# Patient Record
Sex: Female | Born: 1950
Health system: Southern US, Community
[De-identification: ages and names within clinical notes are randomized; demographics above are authoritative.]

## PROBLEM LIST (undated history)

## (undated) DIAGNOSIS — J189 Pneumonia, unspecified organism: Secondary | ICD-10-CM

## (undated) DIAGNOSIS — M1990 Unspecified osteoarthritis, unspecified site: Secondary | ICD-10-CM

## (undated) DIAGNOSIS — I10 Essential (primary) hypertension: Secondary | ICD-10-CM

## (undated) DIAGNOSIS — R51 Headache: Secondary | ICD-10-CM

## (undated) DIAGNOSIS — Z87442 Personal history of urinary calculi: Secondary | ICD-10-CM

## (undated) DIAGNOSIS — F32A Depression, unspecified: Secondary | ICD-10-CM

## (undated) DIAGNOSIS — F419 Anxiety disorder, unspecified: Secondary | ICD-10-CM

## (undated) HISTORY — DX: Headache: R51

## (undated) HISTORY — PX: TOTAL ABDOMINAL HYSTERECTOMY: SHX209

## (undated) HISTORY — DX: Essential (primary) hypertension: I10

## (undated) HISTORY — PX: CHOLECYSTECTOMY: SHX55

## (undated) HISTORY — PX: TONSILLECTOMY: SUR1361

---

## 1997-05-10 ENCOUNTER — Encounter: Admission: RE | Admit: 1997-05-10 | Discharge: 1997-05-10 | Payer: Self-pay | Admitting: Family Medicine

## 1997-05-17 ENCOUNTER — Encounter: Admission: RE | Admit: 1997-05-17 | Discharge: 1997-05-17 | Payer: Self-pay | Admitting: Family Medicine

## 1997-05-25 ENCOUNTER — Encounter: Admission: RE | Admit: 1997-05-25 | Discharge: 1997-05-25 | Payer: Self-pay | Admitting: Family Medicine

## 1998-03-17 ENCOUNTER — Encounter: Admission: RE | Admit: 1998-03-17 | Discharge: 1998-03-17 | Payer: Self-pay | Admitting: Family Medicine

## 1998-08-23 ENCOUNTER — Other Ambulatory Visit: Admission: RE | Admit: 1998-08-23 | Discharge: 1998-08-23 | Payer: Self-pay | Admitting: Obstetrics and Gynecology

## 1998-09-05 ENCOUNTER — Encounter: Admission: RE | Admit: 1998-09-05 | Discharge: 1998-09-05 | Payer: Self-pay | Admitting: Family Medicine

## 1999-08-30 ENCOUNTER — Other Ambulatory Visit: Admission: RE | Admit: 1999-08-30 | Discharge: 1999-08-30 | Payer: Self-pay | Admitting: Obstetrics and Gynecology

## 2000-04-18 ENCOUNTER — Encounter: Admission: RE | Admit: 2000-04-18 | Discharge: 2000-04-18 | Payer: Self-pay | Admitting: Obstetrics and Gynecology

## 2000-04-18 ENCOUNTER — Encounter: Payer: Self-pay | Admitting: Obstetrics and Gynecology

## 2000-09-17 ENCOUNTER — Other Ambulatory Visit: Admission: RE | Admit: 2000-09-17 | Discharge: 2000-09-17 | Payer: Self-pay | Admitting: Obstetrics and Gynecology

## 2001-04-21 ENCOUNTER — Encounter: Payer: Self-pay | Admitting: Obstetrics and Gynecology

## 2001-04-21 ENCOUNTER — Encounter: Admission: RE | Admit: 2001-04-21 | Discharge: 2001-04-21 | Payer: Self-pay | Admitting: Obstetrics and Gynecology

## 2002-04-28 ENCOUNTER — Encounter: Admission: RE | Admit: 2002-04-28 | Discharge: 2002-04-28 | Payer: Self-pay | Admitting: Obstetrics and Gynecology

## 2002-04-28 ENCOUNTER — Encounter: Payer: Self-pay | Admitting: Obstetrics and Gynecology

## 2002-08-19 ENCOUNTER — Ambulatory Visit (HOSPITAL_COMMUNITY): Admission: RE | Admit: 2002-08-19 | Discharge: 2002-08-19 | Payer: Self-pay | Admitting: Gastroenterology

## 2003-06-17 ENCOUNTER — Encounter: Admission: RE | Admit: 2003-06-17 | Discharge: 2003-06-17 | Payer: Self-pay | Admitting: Family Medicine

## 2010-03-26 ENCOUNTER — Institutional Professional Consult (permissible substitution): Payer: Self-pay | Admitting: Internal Medicine

## 2010-04-19 ENCOUNTER — Encounter (INDEPENDENT_AMBULATORY_CARE_PROVIDER_SITE_OTHER): Payer: Self-pay | Admitting: *Deleted

## 2010-04-19 ENCOUNTER — Institutional Professional Consult (permissible substitution) (INDEPENDENT_AMBULATORY_CARE_PROVIDER_SITE_OTHER): Payer: BC Managed Care – PPO | Admitting: Internal Medicine

## 2010-04-19 ENCOUNTER — Other Ambulatory Visit: Payer: BC Managed Care – PPO

## 2010-04-19 ENCOUNTER — Encounter: Payer: Self-pay | Admitting: Internal Medicine

## 2010-04-19 DIAGNOSIS — J45909 Unspecified asthma, uncomplicated: Secondary | ICD-10-CM

## 2010-04-19 DIAGNOSIS — J302 Other seasonal allergic rhinitis: Secondary | ICD-10-CM

## 2010-04-19 DIAGNOSIS — J309 Allergic rhinitis, unspecified: Secondary | ICD-10-CM

## 2010-04-19 DIAGNOSIS — J3089 Other allergic rhinitis: Secondary | ICD-10-CM

## 2010-04-19 HISTORY — DX: Other seasonal allergic rhinitis: J30.2

## 2010-04-19 HISTORY — DX: Unspecified asthma, uncomplicated: J45.909

## 2010-04-19 HISTORY — DX: Other allergic rhinitis: J30.89

## 2010-04-21 DIAGNOSIS — I10 Essential (primary) hypertension: Secondary | ICD-10-CM | POA: Insufficient documentation

## 2010-04-21 HISTORY — DX: Essential (primary) hypertension: I10

## 2010-04-23 LAB — CONVERTED CEMR LAB: IgE (Immunoglobulin E), Serum: 3.8 intl units/mL (ref 0.0–180.0)

## 2010-04-24 NOTE — Assessment & Plan Note (Signed)
Summary: per pt call/allergies/self referral/mhh   Primary Provider/Referring Provider:  Marinda Elk [facility]   CC:  Allergy/asthma consult.  History of Present Illness: April 19, 2010- 60yoF self referrred with concern about her breathing.Comes with her mother. Never smoker describes past history of frequent sinus infections and family but not personal hx of asthma. Over the lat year or two she is noticing frequent/ daily chst tightness, with some cough and wheeze. Triggers have included temperature changes, seasonal pollens which also cause nasal congestion, as well as grass mowing. leaf raking and dusting/ house keeping. Husband likes to cook with a wood stove and that smoke will take her breath. Dr Cristino Martes an office spirometry and told her she had asthma. She was started on Advair. Earlier she had noted improvement using her sister's Advair.  She has homes here and at [facility], noting symptomms more with the changes of air back and forth, rather than with either location specifically. Now taking daily loratadine for postnasal drip. 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 [place]  Risk Factors: Smoking Status: never (04/19/2010)  Past Medical History: Asthma Allergic Rhinitis Hypertension Headache  Past Surgical History: Cholecystectomy Total Abdominal Hysterectomy Tonsillectomy  Family History: Sister- asthma, seasonal rhinitis, obstructive sleep apnea Heart disease Asthma Allergic  rhinitis Cancers  Social History: Marreid- Chrissie Noa Dilon 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  The patient denies coughing up blood, chest pain, irregular heartbeats, loss of appetite, weight change, abdominal pain, tooth/dental problems, headaches, itching, ear ache, hand/feet swelling, rash, change in color of mucus, and fever.         Mother says she snores very little  Vital Signs:  Patient profile:   60 year old female Weight:      222.50 pounds O2 Sat:      96 % on Room air Pulse rate:   82 / minute BP sitting:   124 / 72  (left arm) Cuff size:   large  Vitals Entered By: Reynaldo Minium CMA (April 19, 2010 4:04 PM)  O2 Flow:  Room air CC: Allergy/asthma consult   Physical Exam  Additional Exam:  General: A/Ox3; pleasant and cooperative, NAD, overweight SKIN: no rash, lesions NODES: no lymphadenopathy HEENT: [place]/AT, EOM- WNL, Conjuctivae- clear, PERRLA, TM-WNL, Nose- clear, Throat- clear and wnl, Mallampati  III, not red NECK: Supple w/ fair ROM, JVD- none, normal carotid impulses w/o bruits Thyroid- normal to palpation CHEST: Clear to P&A. Minor dry cough noted once HEART: RRR, no m/g/r heard ABDOMEN: Soft and nl; nml bowel sounds; no organomegaly or masses noted ZOX:WRUE, nl pulses, no edema, cyanosis or clubbng NEURO: Grossly intact to observation      Impression & Recommendations:  Problem # 1:  ASTHMA (ICD-493.90) We will get PFT and maintain with Advair 100.  Add rescue inhaler withh education. Depending on results we can adjust meds. We will make a preliminary assessment of allergy  significance.   Problem # 2:  ALLERGIC RHINITIS (ICD-477.9) At least mild rhinitis with hx of rhinosinusitis associated more with colds. Now taking daily claritin.  Medications Added to Medication List This Visit: 1)  Benicar 20 Mg Tabs (Olmesartan medoxomil) .... Take 1 by mouth once daily 2)  Effexor Xr 150 Mg Xr24h-cap (Venlafaxine hcl) .... Take 1 by mouth once daily 3)  Advair Diskus 100-50 Mcg/dose Aepb (Fluticasone-salmeterol) .Marland Kitchen.. 1 puff and rinse mouth twice daily 4)  Proair Hfa 108 (90 Base) Mcg/act Aers (Albuterol sulfate) .... 2 puffs four times a day as needed rescue  Other Orders: New Patient Level IV (81191) T-Allergy Profile [place], [place], MD, [place], [place] [phone])  Patient Instructions: 1)  Please schedule a follow-up appointment in 2 months. 2)  Script to continue Advair 3)  Schedule PFT 4)  script for Proair rescue inhaler 5)  lab 6)  cc Dr Foy [place] Prescriptions: PROAIR HFA 108 (90 BASE) MCG/ACT AERS (ALBUTEROL SULFATE) 2 puffs four times a day as needed rescue  #1 x prn   Entered and Authorized by:   Waymon Budge MD   Signed by:   Waymon Budge MD on 04/19/2010   Method used:   Print then Give to Patient   RxID:   [id number] ADVAIR DISKUS 100-50 MCG/DOSE AEPB (FLUTICASONE-SALMETEROL) 1 puff and rinse mouth twice daily  #1 x prn   Entered and Authorized by:   Waymon Budge MD   Signed by:   Waymon Budge MD on 04/19/2010   Method used:   Print then Give to Patient   RxID:   [id number]

## 2010-04-30 ENCOUNTER — Ambulatory Visit (INDEPENDENT_AMBULATORY_CARE_PROVIDER_SITE_OTHER): Payer: BC Managed Care – PPO | Admitting: Internal Medicine

## 2010-04-30 DIAGNOSIS — J45909 Unspecified asthma, uncomplicated: Secondary | ICD-10-CM

## 2010-04-30 LAB — PULMONARY FUNCTION TEST

## 2010-04-30 NOTE — Progress Notes (Signed)
PFT done today. 

## 2010-05-07 ENCOUNTER — Encounter: Payer: Self-pay | Admitting: Internal Medicine

## 2010-06-21 ENCOUNTER — Ambulatory Visit: Payer: BC Managed Care – PPO | Admitting: Internal Medicine

## 2010-07-25 ENCOUNTER — Encounter: Payer: Self-pay | Admitting: Internal Medicine

## 2010-07-25 ENCOUNTER — Ambulatory Visit (INDEPENDENT_AMBULATORY_CARE_PROVIDER_SITE_OTHER): Payer: BC Managed Care – PPO | Admitting: Internal Medicine

## 2010-07-25 VITALS — BP 120/78 | HR 71 | Ht 62.0 in | Wt 227.6 lb

## 2010-07-25 DIAGNOSIS — J45909 Unspecified asthma, uncomplicated: Secondary | ICD-10-CM

## 2010-07-25 DIAGNOSIS — J309 Allergic rhinitis, unspecified: Secondary | ICD-10-CM

## 2010-07-25 MED ORDER — MONTELUKAST SODIUM 10 MG PO TABS: 10.0000 mg | ORAL_TABLET | Freq: Every day | ORAL | Status: DC

## 2010-07-25 MED ORDER — FLUTICASONE-SALMETEROL 250-50 MCG/DOSE IN AEPB: 1.0000 | INHALATION_SPRAY | Freq: Two times a day (BID) | RESPIRATORY_TRACT | Status: DC

## 2010-07-25 NOTE — Assessment & Plan Note (Signed)
[place] has [place]- unclear if that will help.  We will schedule allergy skin tests and PFT. Revise meds

## 2010-07-25 NOTE — Progress Notes (Signed)
  Subjective:    Patient ID: Teresa Murphy, female    DOB: [birth year], 60 y.o.   MRN: [id number]  HPI 07/25/10- Asthma, allergic rhintiis Last here April 19, 2010. Note reviewed. Since last here, starting about 3 weeks ago, began increased drainage, head congestion. Took mucinex and breathed steam, but it moved into her chest. Inhaler helps. Mostly this is a spring time issue, starting in March, sometimes in Fall, often while at [place]. Claritin not sufficient. Distinct intervals completely well between spells, and worse if outside more. Suspects grass or hay exposure. Easily winded walking. Never skin tested. Allergy profile 04/19/10- neg, IGe 3.8 She has new HVAC at home and new moisture barrier.   Review of Systems Constitutional:   No weight loss, night sweats,  Fevers, chills, fatigue, lassitude. HEENT:   No headaches,  Difficulty swallowing,  Tooth/dental problems,  Sore throat,                ,   CV:  No chest pain,  Orthopnea, PND, swelling in lower extremities, anasarca, dizziness, palpitations  GI  No heartburn, indigestion, abdominal pain, nausea, vomiting, diarrhea, change in bowel habits, loss of appetite  Resp: No shortness of breath with exertion or at rest.  No excess mucus, no productive cough,  No non-productive cough,  No coughing up of blood.  No change in color of mucus.  No wheezing.   Skin: no rash or lesions.  GU: no dysuria, change in color of urine, no urgency or frequency.  No flank pain.  MS:  No joint pain or swelling.  No decreased range of motion.  No back pain.  Psych:  No change in mood or affect. No depression or anxiety.  No memory loss.      Objective:   Physical Exam General- Alert, Oriented, Affect-appropriate, Distress- none acute  overweight  Skin- rash-none, lesions- none, excoriation- none  Lymphadenopathy- none  Head- atraumatic  Eyes- Gross vision intact, PERRLA, conjunctivae clear secretions  Ears- Hearing, canals, Tm-  normal  Nose- Clear, No- Septal dev, mucus, polyps, erosion, perforation   Throat- Mallampati II , mucosa clear , drainage- none, tonsils- atrophic  Neck- flexible , trachea midline, no stridor , thyroid nl, carotid no bruit  Chest - symmetrical excursion , unlabored     Heart/CV- RRR , no murmur , no gallop  , no rub, nl s1 s2                     - JVD- none , edema- none, stasis changes- none, varices- none     Lung-  Dry cough with deep breath, minimal wheeze , dullness-none, rub- none     Chest wall-   Abd- tender-no, distended-no, bowel sounds-present, HSM- no  Br/ Gen/ Rectal- Not done, not indicated  Extrem- cyanosis- none, clubbing, none, atrophy- none, strength- nl  Neuro- grossly intact to observation         Assessment & Plan:

## 2010-07-25 NOTE — Patient Instructions (Signed)
Sample and script-  Change Advair to 250/50   1 puff and rinse well, twice daily  Script Singulair 1 daily   Schedule PFT- dx asthma  Schedule allergy skin testing- Antihistamines block these, so it is important to be off all cold and allergy meds, cough meds and otc sleep meds  For 3 days before the testing.

## 2010-07-30 NOTE — Assessment & Plan Note (Signed)
She complains of a post nasal drip but with little itch or sneeze and not much repose to meds/ consider vasomotor process.

## 2010-09-18 ENCOUNTER — Ambulatory Visit (INDEPENDENT_AMBULATORY_CARE_PROVIDER_SITE_OTHER): Payer: BC Managed Care – PPO | Admitting: Internal Medicine

## 2010-09-18 DIAGNOSIS — J45909 Unspecified asthma, uncomplicated: Secondary | ICD-10-CM

## 2010-09-18 LAB — PULMONARY FUNCTION TEST

## 2010-09-18 NOTE — Progress Notes (Signed)
PFT done today. 

## 2010-09-24 ENCOUNTER — Ambulatory Visit (INDEPENDENT_AMBULATORY_CARE_PROVIDER_SITE_OTHER): Payer: BC Managed Care – PPO | Admitting: Internal Medicine

## 2010-09-24 ENCOUNTER — Encounter: Payer: Self-pay | Admitting: Internal Medicine

## 2010-09-24 VITALS — BP 122/78 | HR 84 | Ht 62.0 in | Wt 232.0 lb

## 2010-09-24 DIAGNOSIS — J309 Allergic rhinitis, unspecified: Secondary | ICD-10-CM

## 2010-09-24 DIAGNOSIS — J45909 Unspecified asthma, uncomplicated: Secondary | ICD-10-CM

## 2010-09-24 NOTE — Patient Instructions (Signed)
Consider antihistamine like claritin otc for the froggy throat  Consider Pepcid  otc acid blocker once daily before breakfast for the reflux for a while.  Remember not to take the antihistamines like claritin for 3 days before your allergy testing.

## 2010-09-24 NOTE — Progress Notes (Signed)
Subjective:    Patient ID: Teresa Murphy, female    DOB: 1950/11/21, 60 y.o.   MRN: [id number]  HPI    Review of Systems     Objective:   Physical Exam        Assessment & Plan:   Subjective:    Patient ID: Teresa Murphy, female    DOB: 1950/02/19, 60 y.o.   MRN: [id number]  HPI 07/25/10- Asthma, allergic rhintiis Last here April 19, 2010. Note reviewed. Since last here, starting about 3 weeks ago, began increased drainage, head congestion. Took mucinex and breathed steam, but it moved into her chest. Inhaler helps. Mostly this is a spring time issue, starting in March, sometimes in Fall, often while at [place]. Claritin not sufficient. Distinct intervals completely well between spells, and worse if outside more. Suspects grass or hay exposure. Easily winded walking. Never skin tested. Allergy profile 04/19/10- neg, IGe 3.8 She has new HVAC at home and new moisture barrier.   09/24/10-60 year old female nonsmoker followed for asthma, allergic rhinitis  Last here 07/25/2010-note reviewed [place] home. No major attacks or changes since last here. Continues Advair, Singulair. Rescue inhaler  Noticing some throat clearing in current wet weather. Denies sinus trouble, but admits heartburn last few nights. PFT-09/18/10- mild obstruction with response to BD. FEV1 1.95/ 92%; FEV1/FVC 0.76; FEF25-75 improved 48% w/ bronchodilator   Review of Systems Constitutional:   No weight loss, night sweats,  Fevers, chills, fatigue, lassitude. HEENT:   No headaches,  Difficulty swallowing,  Tooth/dental problems,  Sore throat,  CV:  No chest pain,  Orthopnea, PND, swelling in lower extremities, anasarca, dizziness, palpitations GI  No heartburn, indigestion, abdominal pain, nausea, vomiting, diarrhea, change in bowel habits, loss of appetite Resp: No shortness of breath with exertion or at rest.  No excess mucus, no productive cough,  No non-productive cough,  No coughing up  of blood.  No change in color of mucus.  No wheezing.  Skin: no rash or lesions. GU: no dysuria, change in color of urine, no urgency or frequency.  No flank pain. MS:  No joint pain or swelling.  No decreased range of motion.  No back pain. Psych:  No change in mood or affect. No depression or anxiety.  No memory loss.      Objective:   Physical Exam General- Alert, Oriented, Affect-appropriate, Distress- none acute Skin- rash-none, lesions- none, excoriation- none Lymphadenopathy- none Head- atraumatic            Eyes- Gross vision intact, PERRLA, conjunctivae clear secretions            Ears- Hearing, canals normal            Nose- Clear, no-Septal dev, mucus, polyps, erosion, perforation             Throat- Mallampati III , mucosa clear , drainage- none, tonsils- atrophic Neck- flexible , trachea midline, no stridor , thyroid nl, carotid no bruit Chest - symmetrical excursion , unlabored           Heart/CV- RRR , no murmur , no gallop  , no rub, nl s1 s2                           - JVD- none , edema- none, stasis changes- none, varices- none           Lung- clear to P&A, wheeze- none, cough- none , dullness-none, rub-  none           Chest wall-  Abd- tender-no, distended-no, bowel sounds-present, HSM- no Br/ Gen/ Rectal- Not done, not indicated Extrem- cyanosis- none, clubbing, none, atrophy- none, strength- nl Neuro- grossly intact to observation         Assessment & Plan:

## 2010-09-24 NOTE — Assessment & Plan Note (Signed)
Allergy skin testing pending in October. We discussed and reminded to avoid antihistamine then

## 2010-09-24 NOTE — Assessment & Plan Note (Signed)
Asthma is well controlled 

## 2010-11-07 ENCOUNTER — Ambulatory Visit (INDEPENDENT_AMBULATORY_CARE_PROVIDER_SITE_OTHER): Payer: BC Managed Care – PPO | Admitting: Internal Medicine

## 2010-11-07 ENCOUNTER — Encounter: Payer: Self-pay | Admitting: Internal Medicine

## 2010-11-07 VITALS — BP 140/82 | HR 93 | Ht 62.0 in | Wt 234.4 lb

## 2010-11-07 DIAGNOSIS — J45909 Unspecified asthma, uncomplicated: Secondary | ICD-10-CM

## 2010-11-07 DIAGNOSIS — J309 Allergic rhinitis, unspecified: Secondary | ICD-10-CM

## 2010-11-07 MED ORDER — MOMETASONE FUROATE 50 MCG/ACT NA SUSP: 2.0000 | Freq: Every day | NASAL | Status: DC

## 2010-11-07 NOTE — Patient Instructions (Signed)
Consider dust mite encasings and air cleaner  Sample and script for Nasonex-    1-2 puffs in each nostril once daily at bedtime during allergy seasons  Flu vax

## 2010-11-07 NOTE — Progress Notes (Signed)
Patient ID: Teresa Murphy, female    DOB: Nov 30, 1950, 60 y.o.   MRN: [id number]  HPI 07/25/10- Asthma, allergic rhintiis Last here April 19, 2010. Note reviewed. Since last here, starting about 3 weeks ago, began increased drainage, head congestion. Took mucinex and breathed steam, but it moved into her chest. Inhaler helps. Mostly this is a spring time issue, starting in March, sometimes in Fall, often while at [place]. Claritin not sufficient. Distinct intervals completely well between spells, and worse if outside more. Suspects grass or hay exposure. Easily winded walking. Never skin tested. Allergy profile 04/19/10- neg, IGe 3.8 She has new HVAC at home and new moisture barrier.   09/24/10-60 year old female nonsmoker followed for asthma, allergic rhinitis  Last here 07/25/2010-note reviewed [place] home. No major attacks or changes since last here. Continues Advair, Singulair. Rescue inhaler  Noticing some throat clearing in current wet weather. Denies sinus trouble, but admits heartburn last few nights. PFT-09/18/10- mild obstruction with response to BD. FEV1 1.95/ 92%; FEV1/FVC 0.76; FEF25-75 improved 48% w/ bronchodilator  11/07/10- 60 year old female nonsmoker followed for asthma, allergic rhinitis  She comes today for allergy skin testing. Offered antihistamines as planned, she notices nasal congestion and hoarseness, sneezing and postnasal drip. Has been minimal wheeze and some chest tightness. She does notice that the cooler weather is bothering her some. Skin test: Allergy skin tests show significant positives particularly for grass, tree and dust mite.  Review of Systems Constitutional:   No weight loss, night sweats,  Fevers, chills, fatigue, lassitude. HEENT:   No headaches,  Difficulty swallowing,  Tooth/dental problems,  Sore throat,  CV:  No chest pain,  Orthopnea, PND, swelling in lower extremities, anasarca, dizziness, palpitations GI  No heartburn,  indigestion, abdominal pain, nausea, vomiting, diarrhea, change in bowel habits, loss of appetite Resp: No shortness of breath with exertion or at rest.  No excess mucus, no productive cough,  No non-productive cough,  No coughing up of blood.  No change in color of mucus.  No wheezing.  Skin: no rash or lesions. GU: no dysuria, change in color of urine, no urgency or frequency.  No flank pain. MS:  No joint pain or swelling.  No decreased range of motion.  No back pain. Psych:  No change in mood or affect. No depression or anxiety.  No memory loss.      Objective:   Physical Exam General- Alert, Oriented, Affect-appropriate, Distress- none acute Skin- rash-none, lesions- none, excoriation- none Lymphadenopathy- none Head- atraumatic            Eyes- Gross vision intact, PERRLA, conjunctivae clear secretions            Ears- Hearing, canals normal            Nose- blowing her nose, mild turbinate edema, no-Septal dev, +mucus bridging, no-polyps, erosion, perforation             Throat- Mallampati III , mucosa clear , drainage- none, tonsils- atrophic, a little hoarse Neck- flexible , trachea midline, no stridor , thyroid nl, carotid no bruit Chest - symmetrical excursion , unlabored           Heart/CV- RRR , no murmur , no gallop  , no rub, nl s1 s2                           - JVD- none , edema- none, stasis changes- none, varices-  none           Lung- clear to P&A, wheeze- none, dry cough , dullness-none, rub- none           Chest wall-  Abd- tender-no, distended-no, bowel sounds-present, HSM- no Br/ Gen/ Rectal- Not done, not indicated Extrem- cyanosis- none, clubbing, none, atrophy- none, strength- nl Neuro- grossly intact to observation         Assessment & Plan:

## 2010-11-10 NOTE — Assessment & Plan Note (Signed)
Education done on environmental precautions particularly for house dust. She will resume antihistamines. Ttry adding Nasonex. Allergy vaccine would be an option for her if additional therapy were needed. We discussed the logistics and risk-benefit.

## 2010-11-13 ENCOUNTER — Encounter: Payer: Self-pay | Admitting: Internal Medicine

## 2011-03-14 ENCOUNTER — Encounter: Payer: Self-pay | Admitting: Internal Medicine

## 2011-03-14 ENCOUNTER — Ambulatory Visit (INDEPENDENT_AMBULATORY_CARE_PROVIDER_SITE_OTHER): Payer: BC Managed Care – PPO | Admitting: Internal Medicine

## 2011-03-14 VITALS — BP 122/70 | HR 97 | Ht 62.0 in | Wt 238.2 lb

## 2011-03-14 DIAGNOSIS — J309 Allergic rhinitis, unspecified: Secondary | ICD-10-CM

## 2011-03-14 DIAGNOSIS — J45909 Unspecified asthma, uncomplicated: Secondary | ICD-10-CM

## 2011-03-14 MED ORDER — LEVALBUTEROL HCL 0.63 MG/3ML IN NEBU
0.6300 mg | INHALATION_SOLUTION | Freq: Once | RESPIRATORY_TRACT | Status: AC
  Administered 2011-03-14: 0.63 mg via RESPIRATORY_TRACT

## 2011-03-14 MED ORDER — BENZONATATE 200 MG PO CAPS: 200.0000 mg | ORAL_CAPSULE | Freq: Three times a day (TID) | ORAL | Status: AC | PRN

## 2011-03-14 MED ORDER — METHYLPREDNISOLONE ACETATE 80 MG/ML IJ SUSP
80.0000 mg | Freq: Once | INTRAMUSCULAR | Status: AC
  Administered 2011-03-14: 80 mg via INTRAMUSCULAR

## 2011-03-14 NOTE — Progress Notes (Signed)
Patient ID: Teresa Murphy, female    DOB: 11/25/1950, 61 y.o.   MRN: [id number]  HPI 07/25/10- Asthma, allergic rhintiis Last here April 19, 2010. Note reviewed. Since last here, starting about 3 weeks ago, began increased drainage, head congestion. Took mucinex and breathed steam, but it moved into her chest. Inhaler helps. Mostly this is a spring time issue, starting in March, sometimes in Fall, often while at [place]. Claritin not sufficient. Distinct intervals completely well between spells, and worse if outside more. Suspects grass or hay exposure. Easily winded walking. Never skin tested. Allergy profile 04/19/10- neg, IGe 3.8 She has new HVAC at home and new moisture barrier.   09/24/10-61 year old female nonsmoker followed for asthma, allergic rhinitis  Last here 07/25/2010-note reviewed [place] home. No major attacks or changes since last here. Continues Advair, Singulair. Rescue inhaler  Noticing some throat clearing in current wet weather. Denies sinus trouble, but admits heartburn last few nights. PFT-09/18/10- mild obstruction with response to BD. FEV1 1.95/ 92%; FEV1/FVC 0.76; FEF25-75 improved 48% w/ bronchodilator  11/07/10- 61 year old female nonsmoker followed for asthma, allergic rhinitis  She comes today for allergy skin testing. Offered antihistamines as planned, she notices nasal congestion and hoarseness, sneezing and postnasal drip. Has been minimal wheeze and some chest tightness. She does notice that the cooler weather is bothering her some. Skin test: Allergy skin tests show significant positives particularly for grass, tree and dust mite.  3./17/66- 61 year old female nonsmoker followed for asthma, allergic rhinitis  Had flu shot. Raspy voice, postnasal drip. Otherwise well through the winter. Using both Nasonex and Singulair with occasional use of rescue inhaler.   Review of Systems Constitutional:   No weight loss, night sweats,  Fevers,  chills, fatigue, lassitude. HEENT:   No- headaches,  Difficulty swallowing,  Tooth/dental problems,  Sore throat,  CV:  No chest pain,  Orthopnea, PND, swelling in lower extremities, anasarca, dizziness, palpitations GI  No heartburn, indigestion, abdominal pain, nausea, vomiting, diarrhea, change in bowel habits, loss of appetite Resp: No shortness of breath with exertion or at rest.  No excess mucus, no productive cough,  No non-productive cough,  No coughing up of blood.  No change in color of mucus.  Some wheezing.  Skin: no rash or lesions. GU:  MS:  No joint pain or swelling.  No decreased range of motion.  No back pain. Psych:  No change in mood or affect. No depression or anxiety.  No memory loss.      Objective:   Physical Exam General- Alert, Oriented, Affect-appropriate, Distress- none acute Skin- rash-none, lesions- none, excoriation- none Lymphadenopathy- none Head- atraumatic            Eyes- Gross vision intact, PERRLA, conjunctivae clear secretions            Ears- Hearing, canals normal            Nose- blowing her nose, mild turbinate edema, no-Septal dev, +mucus bridging, no-polyps, erosion, perforation             Throat- Mallampati III , mucosa clear , drainage- none, tonsils- atrophic, a little hoarse Neck- flexible , trachea midline, no stridor , thyroid nl, carotid no bruit Chest - symmetrical excursion , unlabored           Heart/CV- RRR , no murmur , no gallop  , no rub, nl s1 s2                           -  JVD- none , edema- none, stasis changes- none, varices- none           Lung- clear to P&A, wheeze- none, raspy upper airway cough , dullness-none, rub- none           Chest wall-  Abd-  Br/ Gen/ Rectal- Not done, not indicated Extrem- cyanosis- none, clubbing, none, atrophy- none, strength- nl Neuro- grossly intact to observation

## 2011-03-14 NOTE — Patient Instructions (Signed)
Script sent benzonatate for cough  Neb xop 0.63  Depo 80

## 2011-03-17 NOTE — Assessment & Plan Note (Signed)
The current illness is post viral but we discussed upcoming spring pollen season.

## 2011-03-17 NOTE — Assessment & Plan Note (Signed)
Acute and chronic tracheobronchitis. Plan-nebulized Xopenex, Depo-Medrol, Tessalon.

## 2011-06-19 ENCOUNTER — Ambulatory Visit: Payer: BC Managed Care – PPO | Admitting: Internal Medicine

## 2011-06-25 ENCOUNTER — Other Ambulatory Visit: Payer: Self-pay | Admitting: Internal Medicine

## 2011-08-21 ENCOUNTER — Other Ambulatory Visit: Payer: Self-pay | Admitting: Internal Medicine

## 2012-01-08 ENCOUNTER — Telehealth: Payer: Self-pay | Admitting: Internal Medicine

## 2012-01-08 MED ORDER — ALBUTEROL SULFATE HFA 108 (90 BASE) MCG/ACT IN AERS: 2.0000 | INHALATION_SPRAY | RESPIRATORY_TRACT | Status: DC | PRN

## 2012-01-08 NOTE — Telephone Encounter (Signed)
I do not see where we gave sample of proair Please advise if okay to call in rx for this Pt last seen 03/14/11 No ov pending

## 2012-01-08 NOTE — Telephone Encounter (Signed)
Per CY-okay to Rx Proair HFA #1 2 puffs every 4 hours prn with prn refills.

## 2012-01-08 NOTE — Telephone Encounter (Signed)
RX sent and pt is aware. 

## 2012-04-02 ENCOUNTER — Other Ambulatory Visit: Payer: Self-pay | Admitting: Family Medicine

## 2012-04-02 DIAGNOSIS — Z1231 Encounter for screening mammogram for malignant neoplasm of breast: Secondary | ICD-10-CM

## 2012-04-29 ENCOUNTER — Ambulatory Visit
Admission: RE | Admit: 2012-04-29 | Discharge: 2012-04-29 | Disposition: A | Discharge status: Home / Self Care | Payer: BC Managed Care – PPO | Source: Ambulatory Visit | Attending: Family Medicine | Admitting: Family Medicine

## 2012-04-29 ENCOUNTER — Other Ambulatory Visit: Payer: Self-pay | Admitting: Family Medicine

## 2012-04-29 DIAGNOSIS — Z1231 Encounter for screening mammogram for malignant neoplasm of breast: Secondary | ICD-10-CM

## 2012-06-06 ENCOUNTER — Emergency Department (HOSPITAL_BASED_OUTPATIENT_CLINIC_OR_DEPARTMENT_OTHER): Payer: BC Managed Care – PPO

## 2012-06-06 ENCOUNTER — Encounter (HOSPITAL_BASED_OUTPATIENT_CLINIC_OR_DEPARTMENT_OTHER): Payer: Self-pay | Admitting: *Deleted

## 2012-06-06 ENCOUNTER — Emergency Department (HOSPITAL_BASED_OUTPATIENT_CLINIC_OR_DEPARTMENT_OTHER)
Admission: EM | Admit: 2012-06-06 | Discharge: 2012-06-06 | Disposition: A | Discharge status: Home / Self Care | Payer: BC Managed Care – PPO | Attending: Emergency Medicine | Admitting: Emergency Medicine

## 2012-06-06 DIAGNOSIS — X58XXXA Exposure to other specified factors, initial encounter: Secondary | ICD-10-CM | POA: Insufficient documentation

## 2012-06-06 DIAGNOSIS — R112 Nausea with vomiting, unspecified: Secondary | ICD-10-CM | POA: Insufficient documentation

## 2012-06-06 DIAGNOSIS — Z79899 Other long term (current) drug therapy: Secondary | ICD-10-CM | POA: Insufficient documentation

## 2012-06-06 DIAGNOSIS — Y939 Activity, unspecified: Secondary | ICD-10-CM | POA: Insufficient documentation

## 2012-06-06 DIAGNOSIS — S7012XA Contusion of left thigh, initial encounter: Secondary | ICD-10-CM

## 2012-06-06 DIAGNOSIS — S7010XA Contusion of unspecified thigh, initial encounter: Secondary | ICD-10-CM | POA: Insufficient documentation

## 2012-06-06 DIAGNOSIS — J45909 Unspecified asthma, uncomplicated: Secondary | ICD-10-CM | POA: Insufficient documentation

## 2012-06-06 DIAGNOSIS — Y929 Unspecified place or not applicable: Secondary | ICD-10-CM | POA: Insufficient documentation

## 2012-06-06 DIAGNOSIS — I10 Essential (primary) hypertension: Secondary | ICD-10-CM | POA: Insufficient documentation

## 2012-06-06 LAB — BASIC METABOLIC PANEL
CO2: 25 mEq/L (ref 19–32)
Calcium: 8.6 mg/dL (ref 8.4–10.5)
Creatinine, Ser: 0.7 mg/dL (ref 0.50–1.10)
Glucose, Bld: 92 mg/dL (ref 70–99)

## 2012-06-06 LAB — CBC WITH DIFFERENTIAL/PLATELET
Basophils Absolute: 0 10*3/uL (ref 0.0–0.1)
Eosinophils Relative: 4 % (ref 0–5)
HCT: 32.6 % — ABNORMAL LOW (ref 36.0–46.0)
Lymphocytes Relative: 31 % (ref 12–46)
Lymphs Abs: 2.6 10*3/uL (ref 0.7–4.0)
MCV: 92.6 fL (ref 78.0–100.0)
Monocytes Absolute: 0.7 10*3/uL (ref 0.1–1.0)
RDW: 13.7 % (ref 11.5–15.5)
WBC: 8.4 10*3/uL (ref 4.0–10.5)

## 2012-06-06 MED ORDER — FENTANYL CITRATE 0.05 MG/ML IJ SOLN
50.0000 ug | Freq: Once | INTRAMUSCULAR | Status: AC
  Administered 2012-06-06: 50 ug via INTRAVENOUS
  Filled 2012-06-06: qty 2

## 2012-06-06 MED ORDER — ONDANSETRON HCL 4 MG/2ML IJ SOLN
4.0000 mg | Freq: Once | INTRAMUSCULAR | Status: AC
  Administered 2012-06-06: 4 mg via INTRAVENOUS
  Filled 2012-06-06: qty 2

## 2012-06-06 MED ORDER — SODIUM CHLORIDE 0.9 % IV BOLUS (SEPSIS)
500.0000 mL | Freq: Once | INTRAVENOUS | Status: AC
  Administered 2012-06-06: 500 mL via INTRAVENOUS

## 2012-06-06 MED ORDER — OXYCODONE-ACETAMINOPHEN 5-325 MG PO TABS: 2.0000 | ORAL_TABLET | ORAL | Status: DC | PRN

## 2012-06-06 NOTE — ED Provider Notes (Signed)
History  This chart was scribed for Rolan Bucco, MD, by Candelaria Stagers, ED Scribe. This patient was seen in room MH07/MH07 and the patient's care was started at 6:18 PM   CSN: [id number]  Arrival date & time 06/06/12  1744   First MD Initiated Contact with Patient 06/06/12 1812      Chief Complaint  Patient presents with  . Fall     The history is provided by the patient. No language interpreter was used.   JERINE SURLES is a 62 y.o. female who presents to the Emergency Department complaining of left mid thigh pain that started about one week ago after she backed into a floor vent and her left leg went into the vent, twisting her leg.  She is also experiencing left groin pain.  Pt was out of town and seen by an MD at [facility] who wanted further studies including [place].  Pt was seen by her PCP upon returning who did not recommend further studies.   She reports since then the pain and bruising has increased and is now making it difficult to walk.  She is able to bear weight with pain.  Pt has also been experiencing emesis with her last episode yesterday.  She denies numbness or tingling to the leg.  Pt denies hitting her head or LOC.      Past Medical History  Diagnosis Date  . Asthma   . Allergic rhinitis   . Hypertension   . Headache     Past Surgical History  Procedure Laterality Date  . Cholecystectomy    . Total abdominal hysterectomy    . Tonsillectomy      Family History  Problem Relation Age of Onset  . Asthma Sister   . Sleep apnea Sister   . Heart disease    . Allergic rhinitis    . Cancer      History  Substance Use Topics  . Smoking status: Never Smoker   . Smokeless tobacco: Not on file  . Alcohol Use: No    OB History   Grav Para Term Preterm Abortions TAB SAB Ect Mult Living                  Review of Systems  Constitutional: Negative for fever, chills, diaphoresis and fatigue.  HENT: Negative for congestion, rhinorrhea and sneezing.    Eyes: Negative.   Respiratory: Negative for cough, chest tightness and shortness of breath.   Cardiovascular: Negative for chest pain and leg swelling.  Gastrointestinal: Positive for nausea and vomiting. Negative for abdominal pain, diarrhea and blood in stool.  Genitourinary: Negative for frequency, hematuria, flank pain and difficulty urinating.  Musculoskeletal: Positive for myalgias and joint swelling. Negative for back pain and arthralgias.  Skin: Positive for wound. Negative for rash.  Neurological: Negative for dizziness, speech difficulty, weakness, numbness and headaches.    Allergies  Review of patient's allergies indicates no known allergies.  Home Medications   Current Outpatient Rx  Name  Route  Sig  Dispense  Refill  . ADVAIR DISKUS 250-50 MCG/DOSE AEPB      INHALE 1 PUFF INTO LUNGS TWICE A DAY RINSE AFTER USE   3 each   3   . albuterol (PROAIR HFA) 108 (90 BASE) MCG/ACT inhaler   Inhalation   Inhale 2 puffs into the lungs every 4 (four) hours as needed.   1 Inhaler   11   . Biotin 1000 MCG tablet   Oral  Take 1,000 mcg by mouth daily.           Marland Kitchen CALCIUM-VITAMIN D PO   Oral   Take 1 capsule by mouth 2 (two) times daily.           . Chromium Picolinate 400 MCG TABS   Oral   Take 1 tablet by mouth daily.           . diazepam (VALIUM) 5 MG tablet      1-3 times as needed         . Glucosamine-Chondroit-Vit C-Mn (GLUCOSAMINE CHONDR 1500 COMPLX PO)   Oral   Take 1 capsule by mouth daily.           Marland Kitchen loratadine (CLARITIN) 10 MG tablet   Oral   Take 10 mg by mouth daily.           Marland Kitchen EXPIRED: mometasone (NASONEX) 50 MCG/ACT nasal spray   Nasal   Place 2 sprays into the nose daily. At bedtime   17 g   prn   . montelukast (SINGULAIR) 10 MG tablet      TAKE 1 TABLET EVERY DAY   90 tablet   3   . olmesartan (BENICAR) 20 MG tablet   Oral   Take 20 mg by mouth daily.           . Omega-3 Fatty Acids (FISH OIL) 1200 MG CAPS    Oral   Take 1 capsule by mouth daily.           Marland Kitchen oxyCODONE-acetaminophen (PERCOCET) 5-325 MG per tablet   Oral   Take 2 tablets by mouth every 4 (four) hours as needed for pain.   20 tablet   0   . venlafaxine (EFFEXOR-XR) 150 MG 24 hr capsule   Oral   Take 150 mg by mouth daily.           . vitamin B-12 (CYANOCOBALAMIN) 1000 MCG tablet   Oral   Take 1,000 mcg by mouth daily.             BP 134/79  Pulse 70  Temp(Src) 97.9 F (36.6 C) (Oral)  Resp 20  Ht 5\' 3"  (1.6 m)  Wt 225 lb (102.059 kg)  BMI 39.87 kg/m2  SpO2 99%  Physical Exam  Constitutional: She is oriented to person, place, and time. She appears well-developed and well-nourished.  HENT:  Head: Normocephalic and atraumatic.  Eyes: Pupils are equal, round, and reactive to light.  Neck: Normal range of motion. Neck supple.  Cardiovascular: Normal rate, regular rhythm and normal heart sounds.   Pulmonary/Chest: Effort normal and breath sounds normal. No respiratory distress. She has no wheezes. She has no rales. She exhibits no tenderness.  Abdominal: Soft. Bowel sounds are normal. There is no tenderness. There is no rebound and no guarding.  Musculoskeletal: Normal range of motion. She exhibits no edema.  Patient has diffuse tenderness throughout the left thigh, primarily to the lateral aspect of the thigh from hip to the knee. She has a large amount of ecchymosis extending diffusely throughout the lateral thigh and into the posterior aspect of the thigh. It extends from the hip to the knee. She has a large approximately 10 cm hematoma to the lateral mid thigh. She has pain on range of motion of the left hip and tenderness on palpation of the entire ecchymotic area of the thighs. She also has diffuse tenderness to the left knee. She has normal sensation in the foot. She  has normal motor function in the foot. She has normal pulses in the foot. She has no significant pain below the knee.  Lymphadenopathy:    She  has no cervical adenopathy.  Neurological: She is alert and oriented to person, place, and time.  Skin: Skin is warm and dry. No rash noted.  Psychiatric: She has a normal mood and affect.    ED Course  Procedures   DIAGNOSTIC STUDIES: Oxygen Saturation is 99% on room air, normal by my interpretation.    COORDINATION OF CARE:  6:23 PM Discussed course of care with pt xrays of left leg and blood work.  Pt understands and agrees.    Labs Reviewed  CBC WITH DIFFERENTIAL - Abnormal; Notable for the following:    RBC 3.52 (*)    Hemoglobin 11.2 (*)    HCT 32.6 (*)    All other components within normal limits  BASIC METABOLIC PANEL - Abnormal; Notable for the following:    Potassium 3.1 (*)    GFR calc non Af Amer 90 (*)    All other components within normal limits   Dg Hip Complete Left  06/06/2012  *RADIOLOGY REPORT*  Clinical Data: Fall 1 week ago  LEFT HIP - COMPLETE 2+ VIEW  Comparison: Concurrently obtained radiographs of the knee and femur  Findings: The bony pelvis is intact.  The proximal left femur is also unremarkable.  No acute fracture or malalignment identified. The hip appears located.  Normal bony mineralization.  Unremarkable visualized bowel gas pattern.  No aggressive lytic or blastic osseous lesion.  Mild degenerative changes at the symphysis pubis.  IMPRESSION: Negative radiographs of the pelvis and left hip   Original Report Authenticated By: Malachy Moan, M.D.    Dg Femur Left  06/06/2012  *RADIOLOGY REPORT*  Clinical Data: Fall 1 week ago, left leg pain  LEFT FEMUR - 2 VIEW  Comparison: Concurrently obtained radiographs of the left hip and knee  Findings: The femur is intact.  No evidence of acute fracture or malalignment.  No focal soft tissue abnormality.  Normal bony mineralization.  Incompletely imaged tricompartmental osteoarthritis of the knee.  IMPRESSION: No acute osseous abnormality.   Original Report Authenticated By: Malachy Moan, M.D.    Ct Hip  Left Wo Contrast  06/06/2012  *RADIOLOGY REPORT*  Clinical Data: Left hip and inguinal pain status post fall  CT OF THE LEFT HIP WITHOUT CONTRAST  Technique:  Multidetector CT imaging was performed according to the standard protocol. Multiplanar CT image reconstructions were also generated.  Comparison: 06/06/2012 radiograph  Findings: The femoral head remains seated within the acetabulum. No displaced fracture.  No aggressive osseous lesions or overt degenerative change within the hip.  There is pubic symphysis and left SI joint DJD, partially imaged.  There is a partially imaged hematoma lateral to the musculature of the left thigh.  The musculature itself appears preserved.  No femoral shaft fracture. No inguinal hematoma with normal caliber vasculature.  IMPRESSION: No displaced fracture or dislocation of the left hip.  MRI has increased sensitivity for nondisplaced fracture or osseous contusion if clinical concern persists.  Left lateral thigh hematoma, partially imaged.   Original Report Authenticated By: Jearld Lesch, M.D.    Dg Knee Complete 4 Views Left  06/06/2012  *RADIOLOGY REPORT*  Clinical Data: Fall 1 week ago, increasing left leg pain  LEFT KNEE - COMPLETE 4+ VIEW  Comparison: Concurrently obtained radiographs the left hip and femur  Findings: Tricompartmental osteoarthritis.  Mild chondrocalcinosis  of the menisci bilaterally.  No knee joint effusion.  Incidental note is made of a fabella.  No focal soft tissue abnormality.  IMPRESSION: No acute fracture, malalignment or joint effusion.  Moderate tricompartmental osteoarthritis.   Original Report Authenticated By: Malachy Moan, M.D.      1. Thigh hematoma, left, initial encounter       MDM  Patient with a large thigh hematoma. She does not have evidence of compartment syndrome. She had x-rays that were negative for fracture however given her large amount of ecchymosis and tenderness I did do a CT scan through the hip to r/o occult  fracture. I discussed the case with Dr. Shelle Iron with orthopedics he recommends crutches nonweightbearing ice and elevation. Patient has an orthopedist, Dr. Charlann Boxer who she will followup with next week. She was given a prescription for Percocet for pain.  I personally performed the services described in this documentation, which was scribed in my presence.  The recorded information has been reviewed and considered.        Rolan Bucco, MD 06/06/12 2135

## 2012-06-06 NOTE — ED Notes (Signed)
Pt states she stepped back into a vent last Friday. Saw Dr. at [facility] who wanted studies done, but pt unable to stay. Saw PCP Wed.  and told tests not necessary. Bruising has increased as well as pain. More difficulty walking.

## 2012-06-17 ENCOUNTER — Emergency Department (HOSPITAL_BASED_OUTPATIENT_CLINIC_OR_DEPARTMENT_OTHER): Payer: BC Managed Care – PPO

## 2012-06-17 ENCOUNTER — Other Ambulatory Visit: Payer: Self-pay | Admitting: Internal Medicine

## 2012-06-17 ENCOUNTER — Emergency Department (HOSPITAL_BASED_OUTPATIENT_CLINIC_OR_DEPARTMENT_OTHER)
Admission: EM | Admit: 2012-06-17 | Discharge: 2012-06-17 | Disposition: A | Discharge status: Home / Self Care | Payer: BC Managed Care – PPO | Attending: Emergency Medicine | Admitting: Emergency Medicine

## 2012-06-17 ENCOUNTER — Encounter (HOSPITAL_BASED_OUTPATIENT_CLINIC_OR_DEPARTMENT_OTHER): Payer: Self-pay | Admitting: *Deleted

## 2012-06-17 DIAGNOSIS — S8010XA Contusion of unspecified lower leg, initial encounter: Secondary | ICD-10-CM | POA: Insufficient documentation

## 2012-06-17 DIAGNOSIS — Y9289 Other specified places as the place of occurrence of the external cause: Secondary | ICD-10-CM | POA: Insufficient documentation

## 2012-06-17 DIAGNOSIS — I10 Essential (primary) hypertension: Secondary | ICD-10-CM | POA: Insufficient documentation

## 2012-06-17 DIAGNOSIS — Z79899 Other long term (current) drug therapy: Secondary | ICD-10-CM | POA: Insufficient documentation

## 2012-06-17 DIAGNOSIS — X500XXA Overexertion from strenuous movement or load, initial encounter: Secondary | ICD-10-CM | POA: Insufficient documentation

## 2012-06-17 DIAGNOSIS — S8012XS Contusion of left lower leg, sequela: Secondary | ICD-10-CM

## 2012-06-17 DIAGNOSIS — J45909 Unspecified asthma, uncomplicated: Secondary | ICD-10-CM | POA: Insufficient documentation

## 2012-06-17 DIAGNOSIS — Y939 Activity, unspecified: Secondary | ICD-10-CM | POA: Insufficient documentation

## 2012-06-17 DIAGNOSIS — Z8709 Personal history of other diseases of the respiratory system: Secondary | ICD-10-CM | POA: Insufficient documentation

## 2012-06-17 MED ORDER — OXYCODONE-ACETAMINOPHEN 5-325 MG PO TABS: 2.0000 | ORAL_TABLET | ORAL | Status: DC | PRN

## 2012-06-17 NOTE — ED Notes (Signed)
MD at bedside giving test results and dispo plan.  

## 2012-06-17 NOTE — ED Notes (Signed)
Pt reports leg injury 5/3, seen here, returns to day for increased pain and swelling

## 2012-06-17 NOTE — ED Notes (Signed)
Left leg injury on 05/29/12 after stepping backwards into an open floor vent.  Has been seen here 2-3 times since injury for left leg.  C/o continued pain behind left knee and left leg bruising.  Denies calf pain.  She still limps and uses crutches to ambulate when needed.  Has taken Aleve, IBU and oxycodone for pain without relief in the last 2-3 nights.  Has also tried ice packs and heating pads without relief.

## 2012-06-17 NOTE — ED Provider Notes (Signed)
History     CSN: [id number]  Arrival date & time 06/17/12  1457   First MD Initiated Contact with Patient 06/17/12 1526      Chief Complaint  Patient presents with  . Leg Injury    (Consider location/radiation/quality/duration/timing/severity/associated sxs/prior treatment) HPI Comments: Patient presents with left flank pain. She had injury to her leg in early May. [place] a then a hole and twisted her left leg. She was seen here on May 3 by knee with a large amount of ecchymosis and hematoma around the quadriceps area. She had no evidence of fracture. She was advised in nonweightbearing for a few days with crutches, ice and elevation. She subsequently followed up with Dr. Charlann Boxer who is her orthopedist. He is advised her that he felt it was a muscular tear and advised her to continue her current treatment. She states her leg is feeling better and she is able to move it better. She states it's still markedly tender in the lateral aspect where she had the most swelling. She's concerned now because she's had some swelling behind her knee and into her calf. She's also noted ecchymosis has spread down into her lower leg. She's concerned that she may have a blood clot. She denies any chest pain or shortness of breath. She denies a history of DVTs in the past.   Past Medical History  Diagnosis Date  . Asthma   . Allergic rhinitis   . Hypertension   . Headache     Past Surgical History  Procedure Laterality Date  . Cholecystectomy    . Total abdominal hysterectomy    . Tonsillectomy      Family History  Problem Relation Age of Onset  . Asthma Sister   . Sleep apnea Sister   . Heart disease    . Allergic rhinitis    . Cancer      History  Substance Use Topics  . Smoking status: Never Smoker   . Smokeless tobacco: Not on file  . Alcohol Use: No    OB History   Grav Para Term Preterm Abortions TAB SAB Ect Mult Living                  Review of Systems   Constitutional: Negative for fever, chills, diaphoresis and fatigue.  HENT: Negative for congestion, rhinorrhea and sneezing.   Eyes: Negative.   Respiratory: Negative for cough, chest tightness and shortness of breath.   Cardiovascular: Negative for chest pain and leg swelling.  Gastrointestinal: Negative for nausea, vomiting, abdominal pain, diarrhea and blood in stool.  Genitourinary: Negative for frequency, hematuria, flank pain and difficulty urinating.  Musculoskeletal: Positive for joint swelling and arthralgias. Negative for back pain.  Skin: Negative for rash.  Neurological: Negative for dizziness, speech difficulty, weakness, numbness and headaches.    Allergies  Review of patient's allergies indicates no known allergies.  Home Medications   Current Outpatient Rx  Name  Route  Sig  Dispense  Refill  . ADVAIR DISKUS 250-50 MCG/DOSE AEPB      INHALE 1 PUFF INTO LUNGS TWICE A DAY RINSE AFTER USE   3 each   3   . albuterol (PROAIR HFA) 108 (90 BASE) MCG/ACT inhaler   Inhalation   Inhale 2 puffs into the lungs every 4 (four) hours as needed.   1 Inhaler   11   . Biotin 1000 MCG tablet   Oral   Take 1,000 mcg by  mouth daily.           Marland Kitchen CALCIUM-VITAMIN D PO   Oral   Take 1 capsule by mouth 2 (two) times daily.           . Chromium Picolinate 400 MCG TABS   Oral   Take 1 tablet by mouth daily.           . diazepam (VALIUM) 5 MG tablet      1-3 times as needed         . Glucosamine-Chondroit-Vit C-Mn (GLUCOSAMINE CHONDR 1500 COMPLX PO)   Oral   Take 1 capsule by mouth daily.           Marland Kitchen loratadine (CLARITIN) 10 MG tablet   Oral   Take 10 mg by mouth daily.           Marland Kitchen EXPIRED: mometasone (NASONEX) 50 MCG/ACT nasal spray   Nasal   Place 2 sprays into the nose daily. At bedtime   17 g   prn   . montelukast (SINGULAIR) 10 MG tablet      TAKE 1 TABLET EVERY DAY   90 tablet   3   . olmesartan (BENICAR) 20 MG tablet   Oral   Take 20 mg by  mouth daily.           . Omega-3 Fatty Acids (FISH OIL) 1200 MG CAPS   Oral   Take 1 capsule by mouth daily.           Marland Kitchen oxyCODONE-acetaminophen (PERCOCET) 5-325 MG per tablet   Oral   Take 2 tablets by mouth every 4 (four) hours as needed for pain.   20 tablet   0   . oxyCODONE-acetaminophen (PERCOCET) 5-325 MG per tablet   Oral   Take 2 tablets by mouth every 4 (four) hours as needed for pain.   20 tablet   0   . venlafaxine (EFFEXOR-XR) 150 MG 24 hr capsule   Oral   Take 150 mg by mouth daily.           . vitamin B-12 (CYANOCOBALAMIN) 1000 MCG tablet   Oral   Take 1,000 mcg by mouth daily.             BP 130/71  Pulse 87  Temp(Src) 98.5 F (36.9 C) (Oral)  Resp 18  Ht 5\' 3"  (1.6 m)  Wt 223 lb (101.152 kg)  BMI 39.51 kg/m2  SpO2 95%  Physical Exam  Constitutional: She is oriented to person, place, and time. She appears well-developed and well-nourished.  HENT:  Head: Normocephalic and atraumatic.  Eyes: Pupils are equal, round, and reactive to light.  Neck: Normal range of motion. Neck supple.  Cardiovascular: Normal rate, regular rhythm and normal heart sounds.   Pulmonary/Chest: Effort normal and breath sounds normal. No respiratory distress. She has no wheezes. She has no rales. She exhibits no tenderness.  Abdominal: Soft. Bowel sounds are normal. There is no tenderness. There is no rebound and no guarding.  Musculoskeletal: Normal range of motion. She exhibits no edema.  Patient still has a large hematoma to the left lateral thigh. Ecchymosis is markedly improved however she does have some new ecchymosis in her lower leg. Her pulses are intact. She has normal sensation motor function in her foot. She definitely has better movement in the leg as compared to her last visit.  Lymphadenopathy:    She has no cervical adenopathy.  Neurological: She is alert and oriented to person, place,  and time.  Skin: Skin is warm and dry. No rash noted.  Psychiatric:  She has a normal mood and affect.    ED Course  Procedures (including critical care time)  Results for orders placed during the hospital encounter of 06/06/12  CBC WITH DIFFERENTIAL      Result Value Range   WBC 8.4  4.0 - 10.5 K/uL   RBC 3.52 (*) 3.87 - 5.11 MIL/uL   Hemoglobin 11.2 (*) 12.0 - 15.0 g/dL   HCT 16.1 (*) 09.6 - 04.5 %   MCV 92.6  78.0 - 100.0 fL   MCH 31.8  26.0 - 34.0 pg   MCHC 34.4  30.0 - 36.0 g/dL   RDW 40.9  81.1 - 91.4 %   Platelets 262  150 - 400 K/uL   Neutrophils Relative % 57  43 - 77 %   Neutro Abs 4.8  1.7 - 7.7 K/uL   Lymphocytes Relative 31  12 - 46 %   Lymphs Abs 2.6  0.7 - 4.0 K/uL   Monocytes Relative 8  3 - 12 %   Monocytes Absolute 0.7  0.1 - 1.0 K/uL   Eosinophils Relative 4  0 - 5 %   Eosinophils Absolute 0.3  0.0 - 0.7 K/uL   Basophils Relative 1  0 - 1 %   Basophils Absolute 0.0  0.0 - 0.1 K/uL  BASIC METABOLIC PANEL      Result Value Range   Sodium 140  135 - 145 mEq/L   Potassium 3.1 (*) 3.5 - 5.1 mEq/L   Chloride 104  96 - 112 mEq/L   CO2 25  19 - 32 mEq/L   Glucose, Bld 92  70 - 99 mg/dL   BUN 12  6 - 23 mg/dL   Creatinine, Ser 7.82  0.50 - 1.10 mg/dL   Calcium 8.6  8.4 - 95.6 mg/dL   GFR calc non Af Amer 90 (*) >90 mL/min   GFR calc Af Amer >90  >90 mL/min    US Venous Img Lower Unilateral Left  06/17/2012   *RADIOLOGY REPORT*  Clinical Data: Left leg pain.  LEFT LOWER EXTREMITY VENOUS DUPLEX ULTRASOUND  Technique:  Gray-scale sonography with graded compression, as well as color Doppler and duplex ultrasound were performed to evaluate the deep venous system of the lower extremity from the level of the common femoral vein through the popliteal and proximal calf veins. Spectral Doppler was utilized to evaluate flow at rest and with distal augmentation maneuvers.  Comparison:  None.  Findings:  Normal compressibility of the common femoral, femoral, and popliteal veins is demonstrated, as well as the visualized proximal calf veins.  No  filling defects to suggest DVT on grayscale or color Doppler imaging.  Doppler waveforms show normal direction of venous flow, normal respiratory phasicity and response to augmentation.  IMPRESSION: No evidence of left lower extremity deep vein thrombosis.   Original Report Authenticated By: Richarda Overlie, M.D.       1. Leg hematoma, left, sequela       MDM  Pelvis suspicion for DVT. I feel that the new ecchymosis in her lower leg is resulting from the normal flow the healing hematoma into the lower leg.  However given her tenderness behind her knee and her cath I will go ahead and do ultrasound rule out DVT.   Ultrasound does not show evidence of DVT. I will give her a prescription for a refill on her pain medication. I advised her to  continue ice and elevation and follow up with her orthopedist as needed.     Rolan Bucco, MD 06/17/12 [phone]

## 2012-08-14 ENCOUNTER — Other Ambulatory Visit: Payer: Self-pay | Admitting: Internal Medicine

## 2012-08-21 ENCOUNTER — Other Ambulatory Visit: Payer: Self-pay | Admitting: Internal Medicine

## 2012-08-24 ENCOUNTER — Other Ambulatory Visit: Payer: Self-pay | Admitting: Internal Medicine

## 2012-09-03 ENCOUNTER — Encounter: Payer: Self-pay | Admitting: Internal Medicine

## 2012-09-03 ENCOUNTER — Ambulatory Visit (INDEPENDENT_AMBULATORY_CARE_PROVIDER_SITE_OTHER): Payer: BC Managed Care – PPO | Admitting: Internal Medicine

## 2012-09-03 VITALS — BP 110/70 | HR 69 | Ht 62.0 in | Wt 212.8 lb

## 2012-09-03 DIAGNOSIS — J45909 Unspecified asthma, uncomplicated: Secondary | ICD-10-CM

## 2012-09-03 DIAGNOSIS — J4520 Mild intermittent asthma, uncomplicated: Secondary | ICD-10-CM

## 2012-09-03 DIAGNOSIS — J309 Allergic rhinitis, unspecified: Secondary | ICD-10-CM

## 2012-09-03 MED ORDER — MONTELUKAST SODIUM 10 MG PO TABS: ORAL_TABLET | ORAL | Status: DC

## 2012-09-03 MED ORDER — FLUTICASONE-SALMETEROL 250-50 MCG/DOSE IN AEPB: INHALATION_SPRAY | RESPIRATORY_TRACT | Status: DC

## 2012-09-03 NOTE — Progress Notes (Signed)
Patient ID: Teresa Murphy, female    DOB: 26-Aug-1950, 62 y.o.   MRN: [id number]  HPI 07/25/10- Asthma, allergic rhintiis Last here April 19, 2010. Note reviewed. Since last here, starting about 3 weeks ago, began increased drainage, head congestion. Took mucinex and breathed steam, but it moved into her chest. Inhaler helps. Mostly this is a spring time issue, starting in March, sometimes in Fall, often while at [place]. Claritin not sufficient. Distinct intervals completely well between spells, and worse if outside more. Suspects grass or hay exposure. Easily winded walking. Never skin tested. Allergy profile 04/19/10- neg, IGe 3.8 She has new HVAC at home and new moisture barrier.   09/24/10-62 year old female nonsmoker followed for asthma, allergic rhinitis  Last here 07/25/2010-note reviewed Had done remediation at beach home. No major attacks or changes since last here. Continues Advair, Singulair. Rescue inhaler  Noticing some throat clearing in current wet weather. Denies sinus trouble, but admits heartburn last few nights. PFT-09/18/10- mild obstruction with response to BD. FEV1 1.95/ 92%; FEV1/FVC 0.76; FEF25-75 improved 48% w/ bronchodilator  11/07/10- 62 year old female nonsmoker followed for asthma, allergic rhinitis  She comes today for allergy skin testing. Offered antihistamines as planned, she notices nasal congestion and hoarseness, sneezing and postnasal drip. Has been minimal wheeze and some chest tightness. She does notice that the cooler weather is bothering her some. Skin test: Allergy skin tests show significant positives particularly for grass, tree and dust mite.  29./74/40- 62 year old female nonsmoker followed for asthma, allergic rhinitis  Had flu shot. Raspy voice, postnasal drip. Otherwise well through the winter. Using both Nasonex and Singulair with occasional use of rescue inhaler.  09/03/12- 62 year old female nonsmoker followed for asthma, allergic rhinitis   FOLLOWS FOR: Rx refill--Singulair and Advair. Denies increased symptoms.  Has done well since here one year ago with no acute episodes. Did need Zyrtec in the spring for rhinitis. Satisfied with meds  Review of Systems- see HPI Constitutional:   No weight loss, night sweats,  Fevers, chills, fatigue, lassitude. HEENT:   No- headaches,  Difficulty swallowing,  Tooth/dental problems,  Sore throat,  CV:  No chest pain,  Orthopnea, PND, swelling in lower extremities, anasarca, dizziness, palpitations GI  No heartburn, indigestion, abdominal pain, nausea, vomiting, diarrhea, change in bowel habits, loss of appetite Resp: No shortness of breath with exertion or at rest.  No excess mucus, no productive cough,  No non-productive cough,  No coughing up of blood.  No change in color of mucus.  Some wheezing.  Skin: no rash or lesions. GU:  MS:  No joint pain or swelling.  No decreased range of motion.  No back pain. Psych:  No change in mood or affect. No depression or anxiety.  No memory loss.  Objective:   Physical Exam General- Alert, Oriented, Affect-appropriate, Distress- none acute. Overweight Skin- rash-none, lesions- none, excoriation- none Lymphadenopathy- none Head- atraumatic            Eyes- Gross vision intact, PERRLA, conjunctivae clear secretions            Ears- Hearing, canals normal            Nose- nose clear, no-Septal dev, +mucus bridging, no-polyps, erosion, perforation             Throat- Mallampati III , mucosa clear , drainage- none, tonsils- atrophic,  Neck- flexible , trachea midline, no stridor , thyroid nl, carotid no bruit Chest - symmetrical excursion , unlabored  Heart/CV- RRR , no murmur , no gallop  , no rub, nl s1 s2                           - JVD- none , edema- none, stasis changes- none, varices- none           Lung- clear to P&A, wheeze- none, raspy upper airway cough , dullness-none, rub- none           Chest wall-  Abd-  Br/ Gen/ Rectal- Not  done, not indicated Extrem- cyanosis- none, clubbing, none, atrophy- none, strength- nl Neuro- grossly intact to observation

## 2012-09-03 NOTE — Patient Instructions (Addendum)
Scripts sent for Advair and singulair  Please call as needed

## 2012-09-13 NOTE — Assessment & Plan Note (Signed)
Good control partly. We discussed  treatment options but agreed to make no changes

## 2012-09-13 NOTE — Assessment & Plan Note (Signed)
Good symptomatic control with seasonal Zyrtec. This seems to be sufficient now. We reviewed dust precautions.

## 2012-09-25 ENCOUNTER — Other Ambulatory Visit: Payer: Self-pay | Admitting: Internal Medicine

## 2013-08-24 ENCOUNTER — Other Ambulatory Visit: Payer: Self-pay | Admitting: Family Medicine

## 2013-08-24 ENCOUNTER — Other Ambulatory Visit (HOSPITAL_BASED_OUTPATIENT_CLINIC_OR_DEPARTMENT_OTHER): Payer: Self-pay | Admitting: Family Medicine

## 2013-08-24 DIAGNOSIS — Z1231 Encounter for screening mammogram for malignant neoplasm of breast: Secondary | ICD-10-CM

## 2013-08-24 DIAGNOSIS — R222 Localized swelling, mass and lump, trunk: Secondary | ICD-10-CM

## 2013-08-25 ENCOUNTER — Ambulatory Visit (HOSPITAL_BASED_OUTPATIENT_CLINIC_OR_DEPARTMENT_OTHER)
Admission: RE | Admit: 2013-08-25 | Discharge: 2013-08-25 | Disposition: A | Discharge status: Home / Self Care | Payer: Medicare Other | Source: Ambulatory Visit | Attending: Family Medicine | Admitting: Family Medicine

## 2013-08-25 DIAGNOSIS — R222 Localized swelling, mass and lump, trunk: Secondary | ICD-10-CM | POA: Insufficient documentation

## 2013-08-30 ENCOUNTER — Ambulatory Visit (HOSPITAL_BASED_OUTPATIENT_CLINIC_OR_DEPARTMENT_OTHER)
Admission: RE | Admit: 2013-08-30 | Discharge: 2013-08-30 | Disposition: A | Discharge status: Home / Self Care | Payer: Medicare Other | Source: Ambulatory Visit | Attending: Family Medicine | Admitting: Family Medicine

## 2013-08-30 ENCOUNTER — Other Ambulatory Visit: Payer: Self-pay | Admitting: Family Medicine

## 2013-08-30 DIAGNOSIS — Z780 Asymptomatic menopausal state: Secondary | ICD-10-CM

## 2013-08-30 DIAGNOSIS — Z1231 Encounter for screening mammogram for malignant neoplasm of breast: Secondary | ICD-10-CM | POA: Diagnosis present

## 2013-08-30 DIAGNOSIS — M810 Age-related osteoporosis without current pathological fracture: Secondary | ICD-10-CM

## 2013-08-31 ENCOUNTER — Ambulatory Visit (INDEPENDENT_AMBULATORY_CARE_PROVIDER_SITE_OTHER): Payer: Medicare Other

## 2013-08-31 DIAGNOSIS — M949 Disorder of cartilage, unspecified: Secondary | ICD-10-CM

## 2013-08-31 DIAGNOSIS — M899 Disorder of bone, unspecified: Secondary | ICD-10-CM

## 2013-08-31 DIAGNOSIS — M810 Age-related osteoporosis without current pathological fracture: Secondary | ICD-10-CM

## 2013-08-31 DIAGNOSIS — Z780 Asymptomatic menopausal state: Secondary | ICD-10-CM

## 2013-09-03 ENCOUNTER — Encounter: Payer: Self-pay | Admitting: Internal Medicine

## 2013-09-03 ENCOUNTER — Ambulatory Visit (INDEPENDENT_AMBULATORY_CARE_PROVIDER_SITE_OTHER): Payer: Medicare Other | Admitting: Internal Medicine

## 2013-09-03 VITALS — BP 122/74 | HR 76 | Ht 62.0 in | Wt 232.2 lb

## 2013-09-03 DIAGNOSIS — J45909 Unspecified asthma, uncomplicated: Secondary | ICD-10-CM

## 2013-09-03 DIAGNOSIS — J4520 Mild intermittent asthma, uncomplicated: Secondary | ICD-10-CM

## 2013-09-03 NOTE — Progress Notes (Signed)
Patient ID: CHARLESIA CANADAY, female    DOB: May 18, 1950, 63 y.o.   MRN: [id number]  HPI 07/25/10- Asthma, allergic rhintiis Last here April 19, 2010. Note reviewed. Since last here, starting about 3 weeks ago, began increased drainage, head congestion. Took mucinex and breathed steam, but it moved into her chest. Inhaler helps. Mostly this is a spring time issue, starting in March, sometimes in Fall, often while at [place] not sufficient. Distinct intervals completely well between spells, and worse if outside more. Suspects grass or hay exposure. Easily winded walking. Never skin tested. Allergy profile 04/19/10- neg, IGe 3.8 She has new HVAC at home and new moisture barrier.   09/24/10-63 year old female nonsmoker followed for asthma, allergic rhinitis  Last here 07/25/2010-note reviewed [place] home. No major attacks or changes since last here. Continues Advair, Singulair. Rescue inhaler  Noticing some throat clearing in current wet weather. Denies sinus trouble, but admits heartburn last few nights. PFT-09/18/10- mild obstruction with response to BD. FEV1 1.95/ 92%; FEV1/FVC 0.76; FEF25-75 improved 48% w/ bronchodilator  11/07/10- 63 year old female nonsmoker followed for asthma, allergic rhinitis  She comes today for allergy skin testing. Offered antihistamines as planned, she notices nasal congestion and hoarseness, sneezing and postnasal drip. Has been minimal wheeze and some chest tightness. She does notice that the cooler weather is bothering her some. Skin test: Allergy skin tests show significant positives particularly for grass, tree and dust mite.  77./50/20- 63 year old female nonsmoker followed for asthma, allergic rhinitis  Had flu shot. Raspy voice, postnasal drip. Otherwise well through the winter. Using both Nasonex and Singulair with occasional use of rescue inhaler.  09/03/12- 63 year old female nonsmoker followed for asthma, allergic rhinitis   FOLLOWS FOR: Rx refill--Singulair and Advair. Denies increased symptoms.  Has done well since here one year ago with no acute episodes. Did need Zyrtec in the spring for rhinitis. Satisfied with meds  09/03/13- 63 -year-old female nonsmoker followed for asthma, allergic rhinitis  FOLLOWS FOR: unsure if weather is causing her to have to clear her throat and nose more that usual Zyrtec at bedtime prevents worse drainage. Occasional albuterol rescue inhaler for wheeze especially with exertion. Continues Advair 250. Denies GERD  Review of Systems- see HPI Constitutional:   No weight loss, night sweats,  Fevers, chills, fatigue, lassitude. HEENT:   No- headaches,  Difficulty swallowing,  Tooth/dental problems,  Sore throat,  CV:  No chest pain,  Orthopnea, PND, swelling in lower extremities, anasarca, dizziness, palpitations GI  No heartburn, indigestion, abdominal pain, nausea, vomiting,  Resp: No shortness of breath with exertion or at rest.  No excess mucus, no productive cough,  No non-productive cough,  No coughing up of blood.  No change in color of mucus.  +Some wheezing.  Skin: no rash or lesions. GU:  MS:  No joint pain or swelling.  No decreased range of motion.  No back pain. Psych:  No change in mood or affect. No depression or anxiety.  No memory loss.  Objective:   Physical Exam General- Alert, Oriented, Affect-appropriate, Distress- none acute. Overweight Skin- rash-none, lesions- none, excoriation- none Lymphadenopathy- none Head- atraumatic            Eyes- Gross vision intact, PERRLA, conjunctivae clear secretions            Ears- Hearing, canals normal            Nose- nose clear, no-Septal dev, +mucus bridging, no-polyps, erosion, perforation  Throat- Mallampati III , mucosa clear , drainage- none, tonsils- atrophic,  Neck- flexible , trachea midline, no stridor , thyroid nl, carotid no bruit Chest - symmetrical excursion , unlabored           Heart/CV- RRR  , no murmur , no gallop  , no rub, nl s1 s2                           - JVD- none , edema- none, stasis changes- none, varices- none           Lung- clear to P&A, wheeze- none, raspy upper airway cough , dullness-none, rub- none           Chest wall-  Abd-  Br/ Gen/ Rectal- Not done, not indicated Extrem- cyanosis- none, clubbing, none, atrophy- none, strength- nl Neuro- grossly intact to observation

## 2013-09-03 NOTE — Patient Instructions (Signed)
Sample Dymista nasal spray  1-2 puffs each nostril once daily at bedtime  Ok to continue present meds  Please call as needed

## 2013-09-08 ENCOUNTER — Ambulatory Visit: Payer: BC Managed Care – PPO

## 2013-09-23 ENCOUNTER — Other Ambulatory Visit: Payer: Self-pay | Admitting: Internal Medicine

## 2013-10-01 ENCOUNTER — Other Ambulatory Visit: Payer: Self-pay | Admitting: Internal Medicine

## 2013-11-10 ENCOUNTER — Other Ambulatory Visit: Payer: Self-pay | Admitting: Internal Medicine

## 2013-12-12 NOTE — Assessment & Plan Note (Signed)
Postnasal drip is sounding perennial and may include a vasomotor component Plan-sample Dymista for trial

## 2013-12-12 NOTE — Assessment & Plan Note (Signed)
Good control. Medication talk

## 2014-03-18 ENCOUNTER — Other Ambulatory Visit: Payer: Self-pay | Admitting: Family Medicine

## 2014-03-18 DIAGNOSIS — N644 Mastodynia: Secondary | ICD-10-CM

## 2014-03-25 ENCOUNTER — Ambulatory Visit
Admission: RE | Admit: 2014-03-25 | Discharge: 2014-03-25 | Disposition: A | Discharge status: Home / Self Care | Payer: Medicare Other | Source: Ambulatory Visit | Attending: Family Medicine | Admitting: Family Medicine

## 2014-03-25 DIAGNOSIS — N644 Mastodynia: Secondary | ICD-10-CM

## 2014-08-01 ENCOUNTER — Other Ambulatory Visit: Payer: Self-pay

## 2014-09-05 ENCOUNTER — Encounter: Payer: Self-pay | Admitting: Internal Medicine

## 2014-09-05 ENCOUNTER — Ambulatory Visit (INDEPENDENT_AMBULATORY_CARE_PROVIDER_SITE_OTHER): Payer: Medicare Other | Admitting: Internal Medicine

## 2014-09-05 VITALS — BP 124/70 | HR 88 | Ht 62.0 in | Wt 233.2 lb

## 2014-09-05 DIAGNOSIS — J309 Allergic rhinitis, unspecified: Secondary | ICD-10-CM | POA: Diagnosis not present

## 2014-09-05 DIAGNOSIS — J4520 Mild intermittent asthma, uncomplicated: Secondary | ICD-10-CM | POA: Diagnosis not present

## 2014-09-05 DIAGNOSIS — J3089 Other allergic rhinitis: Secondary | ICD-10-CM

## 2014-09-05 DIAGNOSIS — J302 Other seasonal allergic rhinitis: Secondary | ICD-10-CM

## 2014-09-05 NOTE — Progress Notes (Signed)
Patient ID: Teresa Murphy, female    DOB: April 22, 1950, 64 y.o.   MRN: [id number]  HPI 07/25/10- Asthma, allergic rhintiis Last here April 19, 2010. Note reviewed. Since last here, starting about 3 weeks ago, began increased drainage, head congestion. Took mucinex and breathed steam, but it moved into her chest. Inhaler helps. Mostly this is a spring time issue, starting in March, sometimes in Fall, often while at [place] not sufficient. Distinct intervals completely well between spells, and worse if outside more. Suspects grass or hay exposure. Easily winded walking. Never skin tested. Allergy profile 04/19/10- neg, IGe 3.8 She has new HVAC at home and new moisture barrier.   09/24/10-64 year old female nonsmoker followed for asthma, allergic rhinitis  Last here 07/25/2010-note reviewed [place]iation [place] home. No major attacks or changes since last here. Continues Advair, Singulair. Rescue inhaler  Noticing some throat clearing in current wet weather. Denies sinus trouble, but admits heartburn last few nights. PFT-09/18/10- mild obstruction with response to BD. FEV1 1.95/ 92%; FEV1/FVC 0.76; FEF25-75 improved 48% w/ bronchodilator  11/07/10- 64 year old female nonsmoker followed for asthma, allergic rhinitis  She comes today for allergy skin testing. Offered antihistamines as planned, she notices nasal congestion and hoarseness, sneezing and postnasal drip. Has been minimal wheeze and some chest tightness. She does notice that the cooler weather is bothering her some. Skin test: Allergy skin tests show significant positives particularly for grass, tree and dust mite.  61./49/61- 64 year old female nonsmoker followed for asthma, allergic rhinitis  Had flu shot. Raspy voice, postnasal drip. Otherwise well through the winter. Using both Nasonex and Singulair with occasional use of rescue inhaler.  09/03/12- 64 year old female nonsmoker followed for asthma, allergic rhinitis   FOLLOWS FOR: Rx refill--Singulair and Advair. Denies increased symptoms.  Has done well since here one year ago with no acute episodes. Did need Zyrtec in the spring for rhinitis. Satisfied with meds  09/03/13- 64 -year-old female never smoker followed for asthma, allergic rhinitis  FOLLOWS FOR: unsure if weather is causing her to have to clear her throat and nose more that usual Zyrtec at bedtime prevents worse drainage. Occasional albuterol rescue inhaler for wheeze especially with exertion. Continues Advair 250. Denies GERD  09/05/14-  14 -year-old female nonsmoker followed for asthma, allergic rhinitis , complicated by HBP FOLLOWS FOR:  No complaints. Reports good tolerance of current medicine regimen. Proair, Advair 250, Singulair Rare need rescue inhaler. Denies nasal congestion.  Review of Systems- see HPI Constitutional:   No weight loss, night sweats,  Fevers, chills, fatigue, lassitude. HEENT:   No- headaches,  Difficulty swallowing,  Tooth/dental problems,  Sore throat,  CV:  No chest pain,  Orthopnea, PND, swelling in lower extremities, anasarca, dizziness, palpitations GI  No heartburn, indigestion, abdominal pain, nausea, vomiting,  Resp: No shortness of breath with exertion or at rest.  No excess mucus, no productive cough,  No non-productive cough,  No coughing up of blood.  No change in color of mucus.  +Some wheezing.  Skin: no rash or lesions. GU:  MS:  No joint pain or swelling.  No decreased range of motion.  No back pain. Psych:  No change in mood or affect. No depression or anxiety.  No memory loss.  Objective:   Physical Exam General- Alert, Oriented, Affect-appropriate, Distress- none acute. + Overweight Skin- rash-none, lesions- none, excoriation- none Lymphadenopathy- none Head- atraumatic            Eyes- Gross vision intact, PERRLA, conjunctivae  clear secretions            Ears- Hearing, canals normal            Nose- nose clear, no-Septal dev, +mucus  bridging, no-polyps, erosion, perforation             Throat- Mallampati III , mucosa clear , drainage- none, tonsils- atrophic,  Neck- flexible , trachea midline, no stridor , thyroid nl, carotid no bruit Chest - symmetrical excursion , unlabored           Heart/CV- RRR , no murmur , no gallop  , no rub, nl s1 s2                           - JVD- none , edema- none, stasis changes- none, varices- none           Lung- clear to P&A, wheeze- none, raspy upper airway cough , dullness-none, rub- none           Chest wall-  Abd-  Br/ Gen/ Rectal- Not done, not indicated Extrem- cyanosis- none, clubbing, none, atrophy- none, strength- nl Neuro- grossly intact to observation

## 2014-09-05 NOTE — Patient Instructions (Addendum)
We can continue present meds     Please call if you need [place]  We will log into our system that you had Prevnar 13 in 2015

## 2014-10-15 NOTE — Assessment & Plan Note (Signed)
Well-controlled without recent complication. Rare need for rescue inhaler. Plan-continue present care

## 2014-10-15 NOTE — Assessment & Plan Note (Signed)
Good symptomatic control. Watch need for medication as fall season starts.

## 2014-12-27 ENCOUNTER — Other Ambulatory Visit: Payer: Self-pay | Admitting: Internal Medicine

## 2015-07-10 ENCOUNTER — Ambulatory Visit (INDEPENDENT_AMBULATORY_CARE_PROVIDER_SITE_OTHER): Payer: Medicare Other | Admitting: Family Medicine

## 2015-07-10 ENCOUNTER — Encounter: Payer: Self-pay | Admitting: Family Medicine

## 2015-07-10 ENCOUNTER — Telehealth: Payer: Self-pay | Admitting: Family Medicine

## 2015-07-10 VITALS — BP 118/76 | HR 78 | Temp 97.6°F | Resp 12 | Ht 63.0 in | Wt 237.6 lb

## 2015-07-10 DIAGNOSIS — M170 Bilateral primary osteoarthritis of knee: Secondary | ICD-10-CM | POA: Insufficient documentation

## 2015-07-10 DIAGNOSIS — Z6841 Body Mass Index (BMI) 40.0 and over, adult: Secondary | ICD-10-CM

## 2015-07-10 DIAGNOSIS — E559 Vitamin D deficiency, unspecified: Secondary | ICD-10-CM | POA: Insufficient documentation

## 2015-07-10 DIAGNOSIS — F419 Anxiety disorder, unspecified: Secondary | ICD-10-CM | POA: Insufficient documentation

## 2015-07-10 DIAGNOSIS — I10 Essential (primary) hypertension: Secondary | ICD-10-CM

## 2015-07-10 HISTORY — DX: Vitamin D deficiency, unspecified: E55.9

## 2015-07-10 HISTORY — DX: Bilateral primary osteoarthritis of knee: M17.0

## 2015-07-10 HISTORY — DX: Anxiety disorder, unspecified: F41.9

## 2015-07-10 LAB — BASIC METABOLIC PANEL
BUN: 19 mg/dL (ref 6–23)
CALCIUM: 9.5 mg/dL (ref 8.4–10.5)
CO2: 31 meq/L (ref 19–32)
Chloride: 103 mEq/L (ref 96–112)
Creatinine, Ser: 0.7 mg/dL (ref 0.40–1.20)
GFR: 88.63 mL/min (ref >60.00)
Glucose, Bld: 86 mg/dL (ref 70–99)
Potassium: 4.7 mEq/L (ref 3.5–5.1)
SODIUM: 142 meq/L (ref 135–145)

## 2015-07-10 LAB — VITAMIN D 25 HYDROXY (VIT D DEFICIENCY, FRACTURES): VITD: 37.6 ng/mL (ref 30.00–100.00)

## 2015-07-10 MED ORDER — VITAMIN D (ERGOCALCIFEROL) 1.25 MG (50000 UNIT) PO CAPS: ORAL_CAPSULE | ORAL | Status: DC

## 2015-07-10 MED ORDER — OLMESARTAN MEDOXOMIL 20 MG PO TABS: 20.0000 mg | ORAL_TABLET | Freq: Every day | ORAL | Status: DC

## 2015-07-10 NOTE — Progress Notes (Signed)
Pre visit review using our clinic review tool, if applicable. No additional management support is needed unless otherwise documented below in the visit note. 

## 2015-07-10 NOTE — Telephone Encounter (Signed)
error 

## 2015-07-10 NOTE — Patient Instructions (Addendum)
A few things to remember from today's visit:   1. Vitamin D deficiency  - Basic metabolic panel - VITAMIN D 25 Hydroxy (Vit-D Deficiency, Fractures)  2. Essential hypertension  - olmesartan (BENICAR) 20 MG tablet; Take 1 tablet (20 mg total) by mouth daily.  Dispense: 90 tablet; Refill: 2 - Basic metabolic panel  3. Anxiety disorder, unspecified   4. Osteoarthritis of both knees, unspecified osteoarthritis type  Cymbalta to be considered.  Daily brisk walking and healthy diet.    Chronic problems are otherwise stable. No changes in current management today.  Remember to arrange your follow up appt before leaving today.   We have ordered labs or studies at this visit.  It can take up to 1-2 weeks for results and processing. IF results require follow up or explanation, we will call you with instructions. Clinically stable results will be released to your [facility]. If you have not heard from [place] or cannot find your results in [facility] in 2 weeks please contact our office at [phone].  If you are not yet signed up for [facility], please consider signing up

## 2015-07-10 NOTE — Progress Notes (Signed)
Ms. Teresa Murphy is a 65 y.o.female, who is here today to follow on HTN and other chronic medical problems.  Ms Alsbrook has been under my care for about 2 years, former [facility] pt.  HTN:  Currently she is on Benicar 20 mg daily. She is taking medications as instructed, no side effects reported.  She has not noted unusual headache, visual changes, exertional chest pain, dyspnea,  focal weakness, or edema. BP reading at home < 140/90. Recent eye exam, intraocular pressure mildly elevated, started on eye drops and 4 weeks f/u recommended.  Anxiety/depression: She is currently on Effexor XR 150 mg daily and takes Valium 5 mg daily as needed. Before Effexor she tried other medications, not sure about name. She denies depressed mood or suicidal thoughts. Anxiety is exacerbated by stress at home, her husband mainly. No hx of psychiatric hospitalizations or bipolar disorder.  Effexor and Benicar have a high co-pay, she does not want to change her medications for now, not sure if price will continue increasing.  Knee pain: She is considering arranging an appt with "[facility]", interested in Hyaluronic  Acid injections. She follows with ortho for chronic knee pain, R>L, receiving steroid injections in right knee, it has been some discussion about eventually having TKR. OTC Aleve daily helps with pain, which is exacerbated by prolonged walking,standing,and with prolonged positions. Achy/sharp,intermittent, mild-moderate, gradually getting worse. Relieved by rest. She is not exercising regularly and has not been consistent with a healthy diet.  She lives with husband, independent ADL's and IADL's, does not use a cane. Hx of asthma and allergies, follows with pulmonologist.   Review of Systems  Constitutional: Negative for fever, activity change, appetite change, fatigue and unexpected weight change.  HENT: Negative for mouth sores, nosebleeds and trouble swallowing.     Eyes: Negative for pain, redness and visual disturbance.  Respiratory: Negative for cough, shortness of breath and wheezing.   Cardiovascular: Negative for chest pain, palpitations and leg swelling.  Gastrointestinal: Negative for nausea, vomiting and abdominal pain.       Negative for changes in bowel habits.  Endocrine: Negative for cold intolerance and heat intolerance.  Genitourinary: Negative for dysuria, hematuria, decreased urine volume and difficulty urinating.  Musculoskeletal: Positive for arthralgias. Negative for myalgias.  Skin: Negative for rash and wound.  Allergic/Immunologic: Positive for environmental allergies.  Neurological: Negative for seizures, syncope, weakness, numbness and headaches.  Psychiatric/Behavioral: Negative for suicidal ideas, confusion and sleep disturbance. The patient is nervous/anxious (stable with medication).      Current Outpatient Prescriptions on File Prior to Visit  Medication Sig Dispense Refill  . albuterol (PROAIR HFA) 108 (90 BASE) MCG/ACT inhaler Inhale 2 puffs into the lungs every 4 (four) hours as needed. 1 Inhaler 11  . CALCIUM-VITAMIN D PO Take 1 capsule by mouth 2 (two) times daily.      . cetirizine (ZYRTEC) 10 MG tablet Take 10 mg by mouth daily.    . diazepam (VALIUM) 5 MG tablet 1-3 times as needed    . Fluticasone-Salmeterol (ADVAIR DISKUS) 250-50 MCG/DOSE AEPB INHALE 1 PUFF INTO LUNGS TWICE A DAY RINSE AFTER USE 3 each 3  . Glucosamine-Chondroit-Vit C-Mn (GLUCOSAMINE CHONDR 1500 COMPLX PO) Take 1 capsule by mouth daily.      . montelukast (SINGULAIR) 10 MG tablet TAKE 1 TABLET EVERY DAY 90 tablet 2  . naproxen sodium (ANAPROX) 220 MG tablet Take 220 mg by mouth 2 (two) times daily with a meal.    .  Omega-3 Fatty Acids (FISH OIL) 1200 MG CAPS Take 1 capsule by mouth daily.      Marland Kitchen venlafaxine (EFFEXOR-XR) 150 MG 24 hr capsule Take 150 mg by mouth daily.      . vitamin B-12 (CYANOCOBALAMIN) 1000 MCG tablet Take 1,000 mcg by mouth  daily.       No current facility-administered medications on file prior to visit.     Past Medical History  Diagnosis Date  . Asthma   . Allergic rhinitis   . Hypertension   . Headache(784.0)     No Known Allergies  Social History   Social History  . Marital Status: Married    Spouse Name: [name]  . Number of Children: 0  . Years of Education: College   Occupational History  . retired    Social History Main Topics  . Smoking status: Never Smoker   . Smokeless tobacco: Never Used  . Alcohol Use: No  . Drug Use: No  . Sexual Activity: Yes    Birth Control/ Protection: Surgical   Other Topics Concern  . None   Social History Narrative   Patient lives at home with spouse.   Caffeine Use:     Filed Vitals:   07/10/15 0959  BP: 118/76  Pulse: 78  Temp: 97.6 F (36.4 C)  Resp: 12   Body mass index is 42.1 kg/(m^2).  SpO2 Readings from Last 3 Encounters:  07/10/15 96%  09/05/14 94%  09/03/13 93%     Physical Exam  Constitutional: She is oriented to person, place, and time. She appears well-developed. No distress.  HENT:  Head: Atraumatic.  Mouth/Throat: Oropharynx is clear and moist and mucous membranes are normal.  Eyes: Conjunctivae and EOM are normal. Pupils are equal, round, and reactive to light.  Neck: No muscular tenderness present. No tracheal deviation present. Thyromegaly (palpable, symmetric with no nodules appreciated) present. No thyroid mass present.  Cardiovascular: Normal rate and regular rhythm.   No murmur heard. Pulses:      Dorsalis pedis pulses are 2+ on the right side, and 2+ on the left side.  Respiratory: Effort normal and breath sounds normal. No respiratory distress.  GI: Soft. She exhibits no mass. There is no hepatomegaly. There is no tenderness.  Musculoskeletal: She exhibits no edema.  Knee crepitus bilateral, pain elicited, main medial aspect right knee , with ROM. Antalgic gait. No effusion or erythema appreciated.    Lymphadenopathy:    She has no cervical adenopathy.  Neurological: She is alert and oriented to person, place, and time. She has normal strength. Coordination normal.  Skin: Skin is warm. No erythema.  Psychiatric: She has a normal mood and affect.  Well groomed, good eye contact.     ASSESSMENT AND PLAN:   [place] was seen today for follow up.  Diagnoses and all orders for this visit:  Vitamin D deficiency  No changes in current management, will follow labs done today and will give further recommendations accordingly.  -     Basic metabolic panel -     VITAMIN D 25 Hydroxy (Vit-D Deficiency, Fractures) -     Vitamin D, Ergocalciferol, (DRISDOL) 50000 units CAPS capsule; 1 cap q 5 days.   Osteoarthritis of both knees, unspecified osteoarthritis type  We discussed some side effects of NSAIDs, I think is appropriate to consider a Hyaluronic acid injections, she will let me know if she needs a referral.  She may also consider asking her orthopedist if he is familiar with  this kind of injections. Weight loss might be beneficial, low impact exercise commended.   Essential hypertension  Adequately controlled. No changes in current management. DASH diet recommended. Eye exam recommended annually. F/U in 6 months, before if needed.  -     olmesartan (BENICAR) 20 MG tablet; Take 1 tablet (20 mg total) by mouth daily. -     Basic metabolic panel   Anxiety disorder, unspecified  Stable. Given her Hx of knee pain, she may consider changing to Cymbalta, she would like tyo hold on it but will consider depending of price of Effexor.  No changes in current management. F/U in 6-12 months.   BMI 40.0-44.9, adult ([place])  We discussed benefits of wt loss as well as adverse effects of obesity. Consistency with healthy diet and physical activity recommended. Weight Watchers is a good option as well as daily brisk walking for 15-30 min as tolerated.     -Patient advised to  return sooner than planned today if new concerns arise.     Marvie Calender G. [place], MD  [facility]. [place] office.

## 2015-09-05 ENCOUNTER — Encounter: Payer: Self-pay | Admitting: Internal Medicine

## 2015-09-05 ENCOUNTER — Ambulatory Visit (INDEPENDENT_AMBULATORY_CARE_PROVIDER_SITE_OTHER): Payer: Medicare Other | Admitting: Internal Medicine

## 2015-09-05 DIAGNOSIS — J3089 Other allergic rhinitis: Principal | ICD-10-CM

## 2015-09-05 DIAGNOSIS — J4520 Mild intermittent asthma, uncomplicated: Secondary | ICD-10-CM | POA: Diagnosis not present

## 2015-09-05 DIAGNOSIS — J309 Allergic rhinitis, unspecified: Secondary | ICD-10-CM

## 2015-09-05 DIAGNOSIS — J302 Other seasonal allergic rhinitis: Secondary | ICD-10-CM

## 2015-09-05 MED ORDER — MONTELUKAST SODIUM 10 MG PO TABS: 10.0000 mg | ORAL_TABLET | Freq: Every day | ORAL | 3 refills | Status: DC

## 2015-09-05 MED ORDER — FLUTICASONE-SALMETEROL 250-50 MCG/DOSE IN AEPB: INHALATION_SPRAY | RESPIRATORY_TRACT | 3 refills | Status: DC

## 2015-09-05 MED ORDER — ALBUTEROL SULFATE HFA 108 (90 BASE) MCG/ACT IN AERS: 2.0000 | INHALATION_SPRAY | RESPIRATORY_TRACT | 11 refills | Status: DC | PRN

## 2015-09-05 NOTE — Assessment & Plan Note (Signed)
Mild intermittent uncomplicated well controlled. No wheezing and no use of rescue inhaler. No sleep disturbance. Not at all clear that she needs her Advair and Singulair but agreed to refill them. Our office will be here if needed but I think she can follow with her primary physician and get future refills there as needed.

## 2015-09-05 NOTE — Assessment & Plan Note (Signed)
Interval use of Flonase and an OTC antihistamine should be sufficient. She did not even need that this spring.

## 2015-09-05 NOTE — Patient Instructions (Signed)
We have refilled your asthma prescriptions for the next year.  You will be able to see your primary doctor for allergy and asthma help and future prescriptions now.  If you need to be seen here again, please call.

## 2015-09-05 NOTE — Progress Notes (Signed)
Patient ID: Teresa Murphy, female    DOB: 06-14-1950, 65 y.o.   MRN: [id number]  HPI 07/25/10- Asthma, allergic rhintiis Last here April 19, 2010. Note reviewed. Since last here, starting about 3 weeks ago, began increased drainage, head congestion. Took mucinex and breathed steam, but it moved into her chest. Inhaler helps. Mostly this is a spring time issue, starting in March, sometimes in Fall, often while at [place] not sufficient. Distinct intervals completely well between spells, and worse if outside more. Suspects grass or hay exposure. Easily winded walking. Never skin tested. Allergy profile 04/19/10- neg, IGe 3.8 She has new HVAC at home and new moisture barrier.   09/24/10-65 year old female nonsmoker followed for asthma, allergic rhinitis  Last here 07/25/2010-note reviewed [place] home. No major attacks or changes since last here. Continues Advair, Singulair. Rescue inhaler  Noticing some throat clearing in current wet weather. Denies sinus trouble, but admits heartburn last few nights. PFT-09/18/10- mild obstruction with response to BD. FEV1 1.95/ 92%; FEV1/FVC 0.76; FEF25-75 improved 48% w/ bronchodilator  11/07/10- 65 year old female nonsmoker followed for asthma, allergic rhinitis  She comes today for allergy skin testing. Offered antihistamines as planned, she notices nasal congestion and hoarseness, sneezing and postnasal drip. Has been minimal wheeze and some chest tightness. She does notice that the cooler weather is bothering her some. Skin test: Allergy skin tests show significant positives particularly for grass, tree and dust mite.  69./67/82- 65 year old female nonsmoker followed for asthma, allergic rhinitis  Had flu shot. Raspy voice, postnasal drip. Otherwise well through the winter. Using both Nasonex and Singulair with occasional use of rescue inhaler.  09/03/12- 65 year old female nonsmoker followed for asthma, allergic rhinitis   FOLLOWS FOR: Rx refill--Singulair and Advair. Denies increased symptoms.  Has done well since here one year ago with no acute episodes. Did need Zyrtec in the spring for rhinitis. Satisfied with meds  09/03/13- 65 -year-old female never smoker followed for asthma, allergic rhinitis  FOLLOWS FOR: unsure if weather is causing her to have to clear her throat and nose more that usual Zyrtec at bedtime prevents worse drainage. Occasional albuterol rescue inhaler for wheeze especially with exertion. Continues Advair 250. Denies GERD  09/05/14-  65 -year-old female nonsmoker followed for asthma, allergic rhinitis , complicated by HBP FOLLOWS FOR:  No complaints. Reports good tolerance of current medicine regimen. Proair, Advair 250, Singulair Rare need rescue inhaler. Denies nasal congestion.  09/05/2015-65 year old female nonsmoker followed for Asthma, Allergic rhinitis, complicated by HBP FOLLOWS FOR:Pt denies any troubles with breathing or allergies at this time. Has had no significant problems over the last 2 years. Continues Advair twice daily with Singulair but "never" needs rescue inhaler. Denies cough or wheeze.  Review of Systems- see HPI Constitutional:   No weight loss, night sweats,  Fevers, chills, fatigue, lassitude. HEENT:   No- headaches,  Difficulty swallowing,  Tooth/dental problems,  Sore throat,  CV:  No chest pain,  Orthopnea, PND, swelling in lower extremities, anasarca, dizziness, palpitations GI  No heartburn, indigestion, abdominal pain, nausea, vomiting,  Resp: No shortness of breath with exertion or at rest.  No excess mucus, no productive cough,  No non-productive cough,  No coughing up of blood.  No change in color of mucus. wheezing-none.  Skin: no rash or lesions. GU:  MS:  No joint pain or swelling.  No decreased range of motion.  No back pain. Psych:  No change in mood or affect. No depression or  anxiety.  No memory loss.  Objective:   Physical Exam General-  Alert, Oriented, Affect-appropriate, Distress- none acute. + Overweight Skin- rash-none, lesions- none, excoriation- none Lymphadenopathy- none Head- atraumatic            Eyes- Gross vision intact, PERRLA, conjunctivae clear secretions            Ears- Hearing, canals normal            Nose- nose clear, no-Septal dev, no-polyps, erosion, perforation             Throat- Mallampati III , mucosa clear , drainage- none, tonsils- atrophic,  Neck- flexible , trachea midline, no stridor , thyroid nl, carotid no bruit Chest - symmetrical excursion , unlabored           Heart/CV- RRR , no murmur , no gallop  , no rub, nl s1 s2                           - JVD- none , edema- none, stasis changes- none, varices- none           Lung- clear to P&A, wheeze- none,  cough-none, dullness-none, rub- none           Chest wall-  Abd-  Br/ Gen/ Rectal- Not done, not indicated Extrem- cyanosis- none, clubbing, none, atrophy- none, strength- nl Neuro- grossly intact to observation

## 2015-09-20 ENCOUNTER — Other Ambulatory Visit: Payer: Self-pay | Admitting: Internal Medicine

## 2016-01-10 ENCOUNTER — Ambulatory Visit: Payer: Medicare Other | Admitting: Family Medicine

## 2016-01-11 ENCOUNTER — Ambulatory Visit (INDEPENDENT_AMBULATORY_CARE_PROVIDER_SITE_OTHER): Payer: Medicare Other | Admitting: Family Medicine

## 2016-01-11 ENCOUNTER — Encounter: Payer: Self-pay | Admitting: Family Medicine

## 2016-01-11 VITALS — BP 122/80 | HR 80 | Resp 12 | Ht 62.0 in | Wt 229.4 lb

## 2016-01-11 DIAGNOSIS — E559 Vitamin D deficiency, unspecified: Secondary | ICD-10-CM | POA: Diagnosis not present

## 2016-01-11 DIAGNOSIS — Z6841 Body Mass Index (BMI) 40.0 and over, adult: Secondary | ICD-10-CM

## 2016-01-11 DIAGNOSIS — I10 Essential (primary) hypertension: Secondary | ICD-10-CM

## 2016-01-11 DIAGNOSIS — Z23 Encounter for immunization: Secondary | ICD-10-CM

## 2016-01-11 DIAGNOSIS — F411 Generalized anxiety disorder: Secondary | ICD-10-CM

## 2016-01-11 DIAGNOSIS — M170 Bilateral primary osteoarthritis of knee: Secondary | ICD-10-CM

## 2016-01-11 HISTORY — DX: Morbid (severe) obesity due to excess calories: E66.01

## 2016-01-11 LAB — COMPREHENSIVE METABOLIC PANEL
ALT: 19 U/L (ref 0–35)
AST: 21 U/L (ref 0–37)
Albumin: 4.1 g/dL (ref 3.5–5.2)
Alkaline Phosphatase: 81 U/L (ref 39–117)
BILIRUBIN TOTAL: 0.6 mg/dL (ref 0.2–1.2)
BUN: 14 mg/dL (ref 6–23)
CHLORIDE: 105 meq/L (ref 96–112)
CO2: 32 mEq/L (ref 19–32)
CREATININE: 0.69 mg/dL (ref 0.40–1.20)
Calcium: 9 mg/dL (ref 8.4–10.5)
GFR: 89.97 mL/min (ref >60.00)
Glucose, Bld: 95 mg/dL (ref 70–99)
Potassium: 4.1 mEq/L (ref 3.5–5.1)
Sodium: 143 mEq/L (ref 135–145)
TOTAL PROTEIN: 7 g/dL (ref 6.0–8.3)

## 2016-01-11 LAB — LIPID PANEL
CHOL/HDL RATIO: 6
Cholesterol: 224 mg/dL — ABNORMAL HIGH (ref 0–200)
HDL: 39.5 mg/dL (ref >39.00)
LDL Cholesterol: 152 mg/dL — ABNORMAL HIGH (ref 0–99)
NONHDL: 184.86
Triglycerides: 163 mg/dL — ABNORMAL HIGH (ref 0.0–149.0)
VLDL: 32.6 mg/dL (ref 0.0–40.0)

## 2016-01-11 LAB — VITAMIN D 25 HYDROXY (VIT D DEFICIENCY, FRACTURES): VITD: 35.61 ng/mL (ref 30.00–100.00)

## 2016-01-11 MED ORDER — OLMESARTAN MEDOXOMIL 20 MG PO TABS: 20.0000 mg | ORAL_TABLET | Freq: Every day | ORAL | 2 refills | Status: DC

## 2016-01-11 MED ORDER — DULOXETINE HCL 60 MG PO CPEP: 60.0000 mg | ORAL_CAPSULE | Freq: Every day | ORAL | 1 refills | Status: DC

## 2016-01-11 NOTE — Patient Instructions (Addendum)
A few things to remember from today's visit:   Essential hypertension - Plan: Lipid panel  Osteoarthritis of both knees, unspecified osteoarthritis type - Plan: DULoxetine (CYMBALTA) 60 MG capsule  Vitamin D deficiency - Plan: VITAMIN D 25 Hydroxy (Vit-D Deficiency, Fractures), Comprehensive metabolic panel  Generalized anxiety disorder - Plan: DULoxetine (CYMBALTA) 60 MG capsule  BMI 40.0-44.9, adult ([place]) - Plan: Lipid panel  Effexor stopped, start Cymbalta.   What are some tips for weight loss? People become overweight for many reasons. Weight issues can run in families. They can be caused by unhealthy behaviors and a person's environment. Certain health problems and medicines can also lead to weight gain. There are some simple things you can do to reach and maintain a healthy weight:  Eat small more frequent healthy meals instead 3 bid meals. Also Weight Watchers is a good option. Avoid sweet drinks. These include regular soft drinks, fruit juices, fruit drinks, energy drinks, sweetened iced tea, and flavored milk. Avoid fast foods. Fast foods such as french fries, hamburgers, chicken nuggets, and pizza are high in calories and can cause weight gain. Eat a healthy breakfast. People who skip breakfast tend to weigh more. Don't watch more than two hours of television per day. Chew sugar-free gum between meals to cut down on snacking. Avoid grocery shopping when you're hungry. Pack a healthy lunch instead of eating out to control what and how much you eat. Eat a lot of fruits and vegetables. Aim for about 2 cups of fruit and 2 to 3 cups of vegetables per day. Aim for 150 minutes per week of moderate-intensity exercise (such as brisk walking), or 75 minutes per week of vigorous exercise (such as jogging or running). OR 15-30 min of daily brisk walking. Be more active. Small changes in physical activity can easily be added to your daily routine. For example, take the stairs instead of the  elevator. Take a walk with your family. A daily walk is a great way to get exercise and to catch up on the day's events.   Please be sure medication list is accurate. If a new problem present, please set up appointment sooner than planned today.

## 2016-01-11 NOTE — Progress Notes (Signed)
Pre visit review using our clinic review tool, if applicable. No additional management support is needed unless otherwise documented below in the visit note. 

## 2016-01-11 NOTE — Progress Notes (Signed)
HPI:   [name] is a 65 y.o. female, who is here today to follow on some of her chronic medical problems.     No new concerns today.   Anxiety:  She is currently on Effexor XR 150 mg, according to patient it is becoming hard to afford it so she would like to explore other treatment options. Stable. She takes Valium 5 mg as needed, about 1-2 per week. She denies depressed mood or suicidal thoughts.   Hypertension:  Currently on Benicar 20 mg daily   She is taking medications as instructed, no side effects reported.  She has not noted unusual headache, visual changes, exertional chest pain, dyspnea,  focal weakness, or edema. She also had eye exam and planning on right eye cataract surgery aftrer Christmas.   Lab Results  Component Value Date   CREATININE 0.70 07/10/2015   BUN 19 07/10/2015   NA 142 07/10/2015   K 4.7 07/10/2015   CL 103 07/10/2015   CO2 31 07/10/2015   Vit D deficiency: She is on Ergocalciferol 50,000 U q 5 days.   She is still following with ortho for knee pain, R>L, surgery recommended.  She has not had Flexogenic treatment she was planning on doing so when I last saw her. Pain is exacerbated by prolonged walking, going up and down stairs, or standing up after prolonged sitting. It is alleviated by rest. She takes Aleve as needed for pain.  Since her last office visit she has changed portion size, she was exercising for a few weeks while she was taking care of of her niece's dog, walking him daily for about 2 weeks.     Review of Systems  Constitutional: Negative for activity change, appetite change, fatigue, fever and unexpected weight change.  HENT: Negative for mouth sores, nosebleeds and trouble swallowing.   Eyes: Negative for pain, redness and visual disturbance.  Respiratory: Negative for cough, shortness of breath and wheezing.   Cardiovascular: Negative for chest pain, palpitations and leg swelling.    Gastrointestinal: Negative for abdominal pain, nausea and vomiting.       Negative for changes in bowel habits.  Genitourinary: Negative for decreased urine volume, difficulty urinating and hematuria.  Musculoskeletal: Positive for arthralgias.  Neurological: Negative for syncope, weakness and headaches.  Psychiatric/Behavioral: Negative for confusion. The patient is nervous/anxious.       Current Outpatient Prescriptions on File Prior to Visit  Medication Sig Dispense Refill  . albuterol (PROAIR HFA) 108 (90 Base) MCG/ACT inhaler Inhale 2 puffs into the lungs every 4 (four) hours as needed. 1 Inhaler 11  . CALCIUM-VITAMIN D PO Take 1 capsule by mouth 2 (two) times daily.      . cetirizine (ZYRTEC) 10 MG tablet Take 10 mg by mouth daily.    . diazepam (VALIUM) 5 MG tablet 1-3 times as needed    . Fluticasone-Salmeterol (ADVAIR DISKUS) 250-50 MCG/DOSE AEPB INHALE 1 PUFF INTO LUNGS TWICE A DAY RINSE AFTER USE 3 each 3  . Glucosamine-Chondroit-Vit C-Mn (GLUCOSAMINE CHONDR 1500 COMPLX PO) Take 1 capsule by mouth daily.      Marland Kitchen latanoprost (XALATAN) 0.005 % ophthalmic solution Place 1 drop into both eyes at bedtime.  11  . montelukast (SINGULAIR) 10 MG tablet TAKE 1 TABLET EVERY DAY 90 tablet 2  . naproxen sodium (ANAPROX) 220 MG tablet Take 220 mg by mouth 2 (two) times daily with a meal.    . vitamin B-12 (CYANOCOBALAMIN) 1000 MCG tablet  Take 1,000 mcg by mouth daily.      . Vitamin D, Ergocalciferol, (DRISDOL) 50000 units CAPS capsule 1 cap q 5 days. 20 capsule 1   No current facility-administered medications on file prior to visit.      Past Medical History:  Diagnosis Date  . Allergic rhinitis   . Asthma   . Headache(784.0)   . Hypertension    No Known Allergies  Social History   Social History  . Marital status: Married    Spouse name: [name]  . Number of children: 0  . Years of education: College   Occupational History  . retired    Social History Main Topics  .  Smoking status: Never Smoker  . Smokeless tobacco: Never Used  . Alcohol use No  . Drug use: No  . Sexual activity: Yes    Birth control/ protection: Surgical   Other Topics Concern  . None   Social History Narrative   Patient lives at home with spouse.   Caffeine Use:     Vitals:   01/11/16 0832  BP: 122/80  Pulse: 80  Resp: 12   O2 sat at RA 97%  Body mass index is 41.95 kg/m.  Wt Readings from Last 3 Encounters:  01/11/16 229 lb 6 oz (104 kg)  09/05/15 234 lb 3.2 oz (106.2 kg)  07/10/15 237 lb 9.6 oz (107.8 kg)      Physical Exam  Nursing note and vitals reviewed. Constitutional: She is oriented to person, place, and time. She appears well-developed. No distress.  HENT:  Head: Atraumatic.  Mouth/Throat: Oropharynx is clear and moist and mucous membranes are normal.  Eyes: Conjunctivae and EOM are normal. Pupils are equal, round, and reactive to light.  Neck: No tracheal deviation present. No thyroid mass and no thyromegaly (palpable) present.  Cardiovascular: Normal rate and regular rhythm.   No murmur heard. Pulses:      Dorsalis pedis pulses are 2+ on the right side, and 2+ on the left side.  Respiratory: Effort normal and breath sounds normal. No respiratory distress.  GI: Soft. She exhibits no mass. There is no hepatomegaly. There is no tenderness.  Musculoskeletal: Edema: 1+ piting LE edema, bilateral.       Right knee: She exhibits no effusion.       Left knee: She exhibits no effusion.  Knee crepitus bilateral, R>L, decreased flexion (moderate). No pain elicited.  Neurological: She is alert and oriented to person, place, and time. She has normal strength. Coordination normal.  Skin: Skin is warm. No erythema.  Psychiatric: She has a normal mood and affect.  Well groomed, good eye contact.      ASSESSMENT AND PLAN:     Teresa Murphy was seen today for follow-up.  Diagnoses and all orders for this visit:  Osteoarthritis of both knees, unspecified  osteoarthritis type  Caution with NSAIDs. Weight loss might also help. She agrees with trying Cymbalta 60 mg daily. Follow-up in 6 months.  -     DULoxetine (CYMBALTA) 60 MG capsule; Take 1 capsule (60 mg total) by mouth daily.  Essential hypertension  Adequately controlled. No changes in current management. DASH diet recommended.  F/U in 6 months, before if needed.  -     Lipid panel -     olmesartan (BENICAR) 20 MG tablet; Take 1 tablet (20 mg total) by mouth daily.  Vitamin D deficiency  No changes in current management, will follow labs done today and will give further recommendations accordingly.  -  VITAMIN D 25 Hydroxy (Vit-D Deficiency, Fractures) -     Comprehensive metabolic panel  Generalized anxiety disorder  She will stop Effexor XR and start Cymbalta 60 mg daily. I am not anticipating any problems switching medications given the fact they are both SNRI's, but she was instructed to let me know if she has any withdrawal-like symptoms in which case we may need to wean Effexor first. Instructed about warning signs. Instructed to let me know in about 4-6 weeks how she is doing with the medication. Follow-up in 6 months.   -     DULoxetine (CYMBALTA) 60 MG capsule; Take 1 capsule (60 mg total) by mouth daily.  BMI 40.0-44.9, adult ([place])  She lost about 5 pounds sine 09/2015 and 8 since her last OV with me. We discussed benefits of wt loss as well as adverse effects of obesity. Consistency with healthy diet and physical activity recommended.    -     Lipid panel  Need for prophylactic vaccination and inoculation against influenza -     Flu vaccine HIGH DOSE PF       -Teresa Murphy was advised to return sooner than planned today if new concerns arise.       Geovanny Sartin G. [place], MD  [facility]. [place] office.

## 2016-01-12 ENCOUNTER — Encounter: Payer: Self-pay | Admitting: Family Medicine

## 2016-02-13 ENCOUNTER — Telehealth: Payer: Self-pay | Admitting: Internal Medicine

## 2016-02-13 MED ORDER — FLUTICASONE-SALMETEROL 250-50 MCG/DOSE IN AEPB: INHALATION_SPRAY | RESPIRATORY_TRACT | 3 refills | Status: DC

## 2016-02-13 NOTE — Telephone Encounter (Signed)
I spoke with pt, and made her aware that on 09-05-15, a RX for 3 inhalers with 3 refills was printed. Pt states she took the Rx to [facility] and asked that they place the Rx on hold until pt needed pt. Pt states [facility] does not have this Rx. I spoke with [facility] who confirmed that they do not have a recent RX.  Rx sent to preferred pharmacy. Pt aware and voiced her understanding.

## 2016-02-19 DIAGNOSIS — M170 Bilateral primary osteoarthritis of knee: Secondary | ICD-10-CM | POA: Diagnosis not present

## 2016-02-19 DIAGNOSIS — M25561 Pain in right knee: Secondary | ICD-10-CM | POA: Diagnosis not present

## 2016-02-19 DIAGNOSIS — G8929 Other chronic pain: Secondary | ICD-10-CM | POA: Diagnosis not present

## 2016-02-19 DIAGNOSIS — M25562 Pain in left knee: Secondary | ICD-10-CM | POA: Diagnosis not present

## 2016-03-07 ENCOUNTER — Telehealth: Payer: Self-pay | Admitting: Internal Medicine

## 2016-03-07 NOTE — Telephone Encounter (Signed)
lmtcb x1 for pt. We need to speak to her before we make an appointment.

## 2016-03-07 NOTE — Telephone Encounter (Signed)
Spoke with pt. She has been scheduled to see TP tomorrow at 3pm. Nothing further was needed.

## 2016-03-08 ENCOUNTER — Ambulatory Visit (INDEPENDENT_AMBULATORY_CARE_PROVIDER_SITE_OTHER)
Admission: RE | Admit: 2016-03-08 | Discharge: 2016-03-08 | Disposition: A | Discharge status: Home / Self Care | Payer: Medicare Other | Source: Ambulatory Visit | Attending: Adult Health | Admitting: Adult Health

## 2016-03-08 ENCOUNTER — Ambulatory Visit (INDEPENDENT_AMBULATORY_CARE_PROVIDER_SITE_OTHER): Payer: Medicare Other | Admitting: Adult Health

## 2016-03-08 ENCOUNTER — Encounter: Payer: Self-pay | Admitting: Adult Health

## 2016-03-08 VITALS — BP 132/76 | HR 102 | Temp 98.4°F | Ht 63.0 in | Wt 228.2 lb

## 2016-03-08 DIAGNOSIS — R05 Cough: Secondary | ICD-10-CM

## 2016-03-08 DIAGNOSIS — R059 Cough, unspecified: Secondary | ICD-10-CM

## 2016-03-08 DIAGNOSIS — J45909 Unspecified asthma, uncomplicated: Secondary | ICD-10-CM

## 2016-03-08 MED ORDER — AZITHROMYCIN 250 MG PO TABS: ORAL_TABLET | ORAL | 0 refills | Status: AC

## 2016-03-08 MED ORDER — METHYLPREDNISOLONE ACETATE 80 MG/ML IJ SUSP
120.0000 mg | Freq: Once | INTRAMUSCULAR | Status: AC
  Administered 2016-03-08: 120 mg via INTRAMUSCULAR

## 2016-03-08 MED ORDER — LEVALBUTEROL HCL 0.63 MG/3ML IN NEBU
0.6300 mg | INHALATION_SOLUTION | Freq: Once | RESPIRATORY_TRACT | Status: AC
  Administered 2016-03-08: 0.63 mg via RESPIRATORY_TRACT

## 2016-03-08 MED ORDER — PREDNISONE 10 MG PO TABS: ORAL_TABLET | ORAL | 0 refills | Status: DC

## 2016-03-08 NOTE — Assessment & Plan Note (Addendum)
Flare -xopenex neb x 1  Depo Medrol x 1 .   Plan  Patient Instructions  Z-Pak take as directed Prednisone taper over the next week Mucinex DM twice daily as needed for cough and congestion. Saline nasal rinses as needed. Chest x-ray today Follow up with Dr. Annamaria Boots  In 6 weeks and As needed   Please contact office for sooner follow up if symptoms do not improve or worsen or seek emergency care

## 2016-03-08 NOTE — Progress Notes (Signed)
@Patient  ID: Teresa Murphy, female    DOB: 07-29-1950, 66 y.o.   MRN: [id number]  Chief Complaint  Patient presents with  . Acute Visit    Referring provider: [place], [name], MD  HPI: 66 year old female, nonsmoker, followed for asthma and allergic rhinitis  03/08/2016 Acute OV  Pt presents for an acute office visit. Patient complains of a one-week history of nasal congestion, drainage cough with thick yellow mucus, wheezing and increased shortness of breath. She denies any body aches or fevers. She has had some intermittent chills. Patient was recently on vacation to [place] and return last night. Patient says she did well the first part of her two-week trip and then over the last week. Symptoms have slowly progressed from a sore throat, itchy eyes and drainage. To  Increased wheezing and cough with thick mucus. She denies chest pain, hemoptysis, calf pain or swelling  No Known Allergies  Immunization History  Administered Date(s) Administered  . Influenza Split 11/07/2010, 11/05/2011, 11/04/2012  . Influenza, High Dose Seasonal PF 01/11/2016  . Pneumococcal Conjugate-13 02/04/2013    Past Medical History:  Diagnosis Date  . Allergic rhinitis   . Asthma   . Headache(784.0)   . Hypertension     Tobacco History: History  Smoking Status  . Never Smoker  Smokeless Tobacco  . Never Used   Counseling given: Not Answered   Outpatient Encounter Prescriptions as of 03/08/2016  Medication Sig  . albuterol (PROAIR HFA) 108 (90 Base) MCG/ACT inhaler Inhale 2 puffs into the lungs every 4 (four) hours as needed.  Marland Kitchen CALCIUM-VITAMIN D PO Take 1 capsule by mouth 2 (two) times daily.    . cetirizine (ZYRTEC) 10 MG tablet Take 10 mg by mouth daily.  . diazepam (VALIUM) 5 MG tablet 1-3 times as needed  . DULoxetine (CYMBALTA) 60 MG capsule Take 1 capsule (60 mg total) by mouth daily.  . Fluticasone-Salmeterol (ADVAIR DISKUS) 250-50 MCG/DOSE AEPB INHALE 1 PUFF INTO LUNGS TWICE A DAY  RINSE AFTER USE  . Glucosamine-Chondroit-Vit C-Mn (GLUCOSAMINE CHONDR 1500 COMPLX PO) Take 1 capsule by mouth daily.    Marland Kitchen latanoprost (XALATAN) 0.005 % ophthalmic solution Place 1 drop into both eyes at bedtime.  . montelukast (SINGULAIR) 10 MG tablet TAKE 1 TABLET EVERY DAY  . naproxen sodium (ANAPROX) 220 MG tablet Take 220 mg by mouth 2 (two) times daily with a meal.  . olmesartan (BENICAR) 20 MG tablet Take 1 tablet (20 mg total) by mouth daily.  . vitamin B-12 (CYANOCOBALAMIN) 1000 MCG tablet Take 1,000 mcg by mouth daily.    . Vitamin D, Ergocalciferol, (DRISDOL) 50000 units CAPS capsule 1 cap q 5 days.  Marland Kitchen azithromycin (ZITHROMAX Z-PAK) 250 MG tablet Take 2 tablets (500 mg) on  Day 1,  followed by 1 tablet (250 mg) once daily on Days 2 through 5.  . predniSONE (DELTASONE) 10 MG tablet 4 tabs for 2 days, then 3 tabs for 2 days, 2 tabs for 2 days, then 1 tab for 2 days, then stop  . [EXPIRED] levalbuterol (XOPENEX) nebulizer solution 0.63 mg   . [EXPIRED] methylPREDNISolone acetate (DEPO-MEDROL) injection 120 mg    No facility-administered encounter medications on file as of 03/08/2016.      Review of Systems  Constitutional:   No  weight loss, night sweats,  Fevers, chills,  +fatigue, or  lassitude.  HEENT:   No headaches,  Difficulty swallowing,  Tooth/dental problems, or  Sore throat,  No sneezing, itching, ear ache,  +nasal congestion, post nasal drip,   CV:  No chest pain,  Orthopnea, PND, swelling in lower extremities, anasarca, dizziness, palpitations, syncope.   GI  No heartburn, indigestion, abdominal pain, nausea, vomiting, diarrhea, change in bowel habits, loss of appetite, bloody stools.   Resp:  + wheezing  No chest wall deformity  Skin: no rash or lesions.  GU: no dysuria, change in color of urine, no urgency or frequency.  No flank pain, no hematuria   MS:  No joint pain or swelling.  No decreased range of motion.  No back pain.    Physical  Exam  BP 132/76 (BP Location: Left Arm, Cuff Size: Large)   Pulse (!) 102   Temp 98.4 F (36.9 C) (Oral)   Ht 5\' 3"  (1.6 m)   Wt 228 lb 3.2 oz (103.5 kg)   SpO2 96%   BMI 40.42 kg/m   GEN: A/Ox3; pleasant , NAD, obese    HEENT:  [place]/AT,  EACs-clear, TMs-wnl, NOSE-clear,drainage  THROAT-clear, no lesions, no postnasal drip or exudate noted.   NECK:  Supple w/ fair ROM; no JVD; normal carotid impulses w/o bruits; no thyromegaly or nodules palpated; no lymphadenopathy.    RESP  Few exp wheezes , speaks in full sentences .  no accessory muscle use, no dullness to percussion  CARD:  RRR, no m/r/g, no peripheral edema, pulses intact, no cyanosis or clubbing. Neg homans sign .   GI:   Soft & nt; nml bowel sounds; no organomegaly or masses detected.   Musco: Warm bil, no deformities or joint swelling noted.   Neuro: alert, no focal deficits noted.    Skin: Warm, no lesions or rashes  Psych:  No change in mood or affect. No depression or anxiety.  No memory loss.  Lab Results:  Imaging: No results found.   Assessment & Plan:   Asthma with bronchitis Flare -xopenex neb x 1  Depo Medrol x 1 .   Plan  Patient Instructions  Z-Pak take as directed Prednisone taper over the next week Mucinex DM twice daily as needed for cough and congestion. Saline nasal rinses as needed. Chest x-ray today Follow up with Dr. Annamaria Boots  In 6 weeks and As needed   Please contact office for sooner follow up if symptoms do not improve or worsen or seek emergency care          Rexene Edison, NP 03/08/2016

## 2016-03-08 NOTE — Patient Instructions (Signed)
Z-Pak take as directed Prednisone taper over the next week Mucinex DM twice daily as needed for cough and congestion. Saline nasal rinses as needed. Chest x-ray today Follow up with Dr. Annamaria Boots  In 6 weeks and As needed   Please contact office for sooner follow up if symptoms do not improve or worsen or seek emergency care

## 2016-03-19 ENCOUNTER — Telehealth: Payer: Self-pay | Admitting: Adult Health

## 2016-03-19 DIAGNOSIS — R05 Cough: Secondary | ICD-10-CM

## 2016-03-19 DIAGNOSIS — R059 Cough, unspecified: Secondary | ICD-10-CM

## 2016-03-19 NOTE — Telephone Encounter (Signed)
If she is no better will need to be seen in office ,  Please contact office for sooner follow up if symptoms do not improve or worsen or seek emergency care

## 2016-03-19 NOTE — Telephone Encounter (Signed)
Spoke with pt, scheduled to see TP tomorrow afternoon.  Nothing further needed.

## 2016-03-19 NOTE — Telephone Encounter (Signed)
Spoke with pt. States that she is not feeling any better since seeing TP on 03/08/16. Reports coughing and wheezing. Cough is producing clear mucus. Denies chest tightness, SOB or fever/chills/sweats. Has been taking Mucinex DM with minimal relief. Pt finished Zpack and prednisone taper. Would like TP's recommendations.  TP - please advise. Thanks.

## 2016-03-19 NOTE — Addendum Note (Signed)
Addended by: Len Blalock on: 03/19/2016 05:40 PM   Modules accepted: Orders

## 2016-03-20 ENCOUNTER — Ambulatory Visit (INDEPENDENT_AMBULATORY_CARE_PROVIDER_SITE_OTHER): Payer: Medicare Other | Admitting: Adult Health

## 2016-03-20 ENCOUNTER — Ambulatory Visit (INDEPENDENT_AMBULATORY_CARE_PROVIDER_SITE_OTHER)
Admission: RE | Admit: 2016-03-20 | Discharge: 2016-03-20 | Disposition: A | Discharge status: Home / Self Care | Payer: Medicare Other | Source: Ambulatory Visit | Attending: Adult Health | Admitting: Adult Health

## 2016-03-20 ENCOUNTER — Encounter: Payer: Self-pay | Admitting: Adult Health

## 2016-03-20 DIAGNOSIS — J45909 Unspecified asthma, uncomplicated: Secondary | ICD-10-CM | POA: Diagnosis not present

## 2016-03-20 DIAGNOSIS — J189 Pneumonia, unspecified organism: Secondary | ICD-10-CM | POA: Insufficient documentation

## 2016-03-20 DIAGNOSIS — J181 Lobar pneumonia, unspecified organism: Secondary | ICD-10-CM

## 2016-03-20 DIAGNOSIS — R05 Cough: Secondary | ICD-10-CM | POA: Diagnosis not present

## 2016-03-20 DIAGNOSIS — R059 Cough, unspecified: Secondary | ICD-10-CM

## 2016-03-20 HISTORY — DX: Pneumonia, unspecified organism: J18.9

## 2016-03-20 MED ORDER — LEVALBUTEROL HCL 0.63 MG/3ML IN NEBU
0.6300 mg | INHALATION_SOLUTION | Freq: Once | RESPIRATORY_TRACT | Status: AC
  Administered 2016-03-20: 0.63 mg via RESPIRATORY_TRACT

## 2016-03-20 MED ORDER — LEVOFLOXACIN 500 MG PO TABS: 500.0000 mg | ORAL_TABLET | Freq: Every day | ORAL | 0 refills | Status: AC

## 2016-03-20 MED ORDER — HYDROCODONE-HOMATROPINE 5-1.5 MG/5ML PO SYRP: 5.0000 mL | ORAL_SOLUTION | Freq: Four times a day (QID) | ORAL | 0 refills | Status: DC | PRN

## 2016-03-20 MED ORDER — PREDNISONE 10 MG PO TABS: ORAL_TABLET | ORAL | 0 refills | Status: DC

## 2016-03-20 NOTE — Progress Notes (Signed)
@Patient  ID: Teresa Murphy, female    DOB: 11-03-1950, 66 y.o.   MRN: [id number]  Chief Complaint  Patient presents with  . Acute Visit    Cough     Referring provider: Martinique, [name], MD  HPI: 66 year old female, nonsmoker, followed for asthma and allergic rhinitis.  03/20/2016 Acute OV  Patient returns for persistent cough, congestion and wheezing. Patient was seen 2 weeks ago after a vacation to [place] where she had intermittent chills, cough and wheezing. She was placed on a Z-Pak and a prednisone taper for acute asthmatic bronchitis. Chest x-ray at that time showed no acute process. Patient says she did improve enough to thick yellow mucus. She says her cough is quite severe at times. O2 saturations on arrival today are 97% on room air. No dyspnea.  Appetite is good. No nausea, vomiting, diarrhea. No chest pain, hemoptysis, calf pain or swelling in the legs.  Chest x-ray today shows a right lung infiltrate consistent with pneumonia .  No Known Allergies  Immunization History  Administered Date(s) Administered  . Influenza Split 11/07/2010, 11/05/2011, 11/04/2012  . Influenza, High Dose Seasonal PF 01/11/2016  . Pneumococcal Conjugate-13 02/04/2013    Past Medical History:  Diagnosis Date  . Allergic rhinitis   . Asthma   . Headache(784.0)   . Hypertension     Tobacco History: History  Smoking Status  . Never Smoker  Smokeless Tobacco  . Never Used   Counseling given: Not Answered   Outpatient Encounter Prescriptions as of 03/20/2016  Medication Sig  . albuterol (PROAIR HFA) 108 (90 Base) MCG/ACT inhaler Inhale 2 puffs into the lungs every 4 (four) hours as needed.  Marland Kitchen CALCIUM-VITAMIN D PO Take 1 capsule by mouth 2 (two) times daily.    . cetirizine (ZYRTEC) 10 MG tablet Take 10 mg by mouth daily.  . diazepam (VALIUM) 5 MG tablet 1-3 times as needed  . DULoxetine (CYMBALTA) 60 MG capsule Take 1 capsule (60 mg total) by mouth daily.  . Fluticasone-Salmeterol  (ADVAIR DISKUS) 250-50 MCG/DOSE AEPB INHALE 1 PUFF INTO LUNGS TWICE A DAY RINSE AFTER USE  . Glucosamine-Chondroit-Vit C-Mn (GLUCOSAMINE CHONDR 1500 COMPLX PO) Take 1 capsule by mouth daily.    Marland Kitchen latanoprost (XALATAN) 0.005 % ophthalmic solution Place 1 drop into both eyes at bedtime.  . montelukast (SINGULAIR) 10 MG tablet TAKE 1 TABLET EVERY DAY  . naproxen sodium (ANAPROX) 220 MG tablet Take 220 mg by mouth 2 (two) times daily with a meal.  . olmesartan (BENICAR) 20 MG tablet Take 1 tablet (20 mg total) by mouth daily.  . vitamin B-12 (CYANOCOBALAMIN) 1000 MCG tablet Take 1,000 mcg by mouth daily.    . Vitamin D, Ergocalciferol, (DRISDOL) 50000 units CAPS capsule 1 cap q 5 days.  Marland Kitchen HYDROcodone-homatropine (HYDROMET) 5-1.5 MG/5ML syrup Take 5 mLs by mouth every 6 (six) hours as needed.  Marland Kitchen levofloxacin (LEVAQUIN) 500 MG tablet Take 1 tablet (500 mg total) by mouth daily.  . predniSONE (DELTASONE) 10 MG tablet 4 tabs for 2 days, then 3 tabs for 2 days, 2 tabs for 2 days, then 1 tab for 2 days, then stop (Patient not taking: Reported on 03/20/2016)  . predniSONE (DELTASONE) 10 MG tablet 4 tabs for 2 days, then 3 tabs for 2 days, 2 tabs for 2 days, then 1 tab for 2 days, then stop   No facility-administered encounter medications on file as of 03/20/2016.      Review of Systems  Constitutional:  No  weight loss, night sweats,  Fevers, chills,  +fatigue, or  lassitude.  HEENT:   No headaches,  Difficulty swallowing,  Tooth/dental problems, or  Sore throat,                No sneezing, itching, ear ache,  +nasal congestion, post nasal drip,   CV:  No chest pain,  Orthopnea, PND, swelling in lower extremities, anasarca, dizziness, palpitations, syncope.   GI  No heartburn, indigestion, abdominal pain, nausea, vomiting, diarrhea, change in bowel habits, loss of appetite, bloody stools.   Resp:.  No chest wall deformity  Skin: no rash or lesions.  GU: no dysuria, change in color of urine, no  urgency or frequency.  No flank pain, no hematuria   MS:  No joint pain or swelling.  No decreased range of motion.  No back pain.    Physical Exam  BP 130/80 (BP Location: Right Arm, Patient Position: Sitting, Cuff Size: Normal)   Pulse 77   Ht 5\' 3"  (1.6 m)   Wt 225 lb 12.8 oz (102.4 kg)   SpO2 97%   BMI 40.00 kg/m   GEN: A/Ox3; pleasant , NAD, elderly , barking cough    HEENT:  [place]/AT,  EACs-clear, TMs-wnl, NOSE-clear, THROAT-clear, no lesions, no postnasal drip or exudate noted.   NECK:  Supple w/ fair ROM; no JVD; normal carotid impulses w/o bruits; no thyromegaly or nodules palpated; no lymphadenopathy.    RESP  Scattered rhonchi ,  no accessory muscle use, no dullness to percussion  CARD:  RRR, no m/r/g, no peripheral edema, pulses intact, no cyanosis or clubbing. Neg homans sign , no calf tenderness  GI:   Soft & nt; nml bowel sounds; no organomegaly or masses detected.   Musco: Warm bil, no deformities or joint swelling noted.   Neuro: alert, no focal deficits noted.    Skin: Warm, no lesions or rashes    Lab Results:  CBCNo results found for: BNP  ProBNP No results found for: PROBNP  Imaging: Dg Chest 2 View  Result Date: 03/20/2016 CLINICAL DATA:  Cough.  Wheezing . EXAM: CHEST  2 VIEW COMPARISON:  03/08/2016. FINDINGS: Right mid lung field infiltrate noted consistent pneumonia. No pleural effusion or pneumothorax. Heart size stable with borderline cardiomegaly. No pulmonary venous congestion. No acute bony abnormality . IMPRESSION: Right mid lung field infiltrate consistent pneumonia. Follow-up chest x-rays to demonstrate clearing suggested. Electronically Signed   By: Marcello Moores  Register   On: 03/20/2016 10:01   Dg Chest 2 View  Result Date: 03/08/2016 CLINICAL DATA:  Acute onset of cough, wheezing and dyspnea. Initial encounter. EXAM: CHEST  2 VIEW COMPARISON:  Chest radiograph performed 09/09/2008 FINDINGS: The lungs are well-aerated. Mild peribronchial  thickening is noted. There is no evidence of focal opacification, pleural effusion or pneumothorax. The heart is normal in size; the mediastinal contour is within normal limits. No acute osseous abnormalities are seen. Clips are noted within the right upper quadrant, reflecting prior cholecystectomy. IMPRESSION: Mild peribronchial thickening noted.  Lungs remain otherwise clear. Electronically Signed   By: Garald Balding M.D.   On: 03/08/2016 21:18     Assessment & Plan:   Asthma with bronchitis Exacerbation with secondary Right sided PNA -slow to resolve with persistent infectious source - she is tolerating meds well , good appetite, does not appear toxic and good O2 sats.  Will hold on labs today , if sx not improving will need additional testing .  xopenex neb x  1   Plan  Patient Instructions  Levaquin 500mg  daily for 7 days .  Prednisone taper over the next week Mucinex  twice daily as needed for cough and congestion. Delsym 2 tsp Twice daily  .As needed  Cough .  Hydromet 1 tsp every 6hr As needed  Cough , may make you sleepy .  Follow up with Dr. Annamaria Boots  2-3 weeks and As needed  With chest xray   Please contact office for sooner follow up if symptoms do not improve or worsen or seek emergency care       CAP (community acquired pneumonia) Right sided PNA  Will need CXR serial follow up for clearance   Plan Patient Instructions  Levaquin 500mg  daily for 7 days .  Prednisone taper over the next week Mucinex  twice daily as needed for cough and congestion. Delsym 2 tsp Twice daily  .As needed  Cough .  Hydromet 1 tsp every 6hr As needed  Cough , may make you sleepy .  Follow up with Dr. Annamaria Boots  2-3 weeks and As needed  With chest xray   Please contact office for sooner follow up if symptoms do not improve or worsen or seek emergency care          Rexene Edison, NP 03/20/2016

## 2016-03-20 NOTE — Assessment & Plan Note (Signed)
Right sided PNA  Will need CXR serial follow up for clearance   Plan Patient Instructions  Levaquin 500mg  daily for 7 days .  Prednisone taper over the next week Mucinex  twice daily as needed for cough and congestion. Delsym 2 tsp Twice daily  .As needed  Cough .  Hydromet 1 tsp every 6hr As needed  Cough , may make you sleepy .  Follow up with Dr. Annamaria Boots  2-3 weeks and As needed  With chest xray   Please contact office for sooner follow up if symptoms do not improve or worsen or seek emergency care

## 2016-03-20 NOTE — Assessment & Plan Note (Addendum)
Exacerbation with secondary Right sided PNA -slow to resolve with persistent infectious source - she is tolerating meds well , good appetite, does not appear toxic and good O2 sats.  Will hold on labs today , if sx not improving will need additional testing .  xopenex neb x 1   Plan  Patient Instructions  Levaquin 500mg  daily for 7 days .  Prednisone taper over the next week Mucinex  twice daily as needed for cough and congestion. Delsym 2 tsp Twice daily  .As needed  Cough .  Hydromet 1 tsp every 6hr As needed  Cough , may make you sleepy .  Follow up with Dr. Annamaria Boots  2-3 weeks and As needed  With chest xray   Please contact office for sooner follow up if symptoms do not improve or worsen or seek emergency care

## 2016-03-20 NOTE — Addendum Note (Signed)
Addended by: Parke Poisson E on: 03/20/2016 11:56 AM   Modules accepted: Orders

## 2016-03-20 NOTE — Patient Instructions (Addendum)
Levaquin 500mg  daily for 7 days .  Prednisone taper over the next week Mucinex  twice daily as needed for cough and congestion. Delsym 2 tsp Twice daily  .As needed  Cough .  Hydromet 1 tsp every 6hr As needed  Cough , may make you sleepy .  Follow up with Dr. Annamaria Boots  2-3 weeks and As needed  With chest xray   Please contact office for sooner follow up if symptoms do not improve or worsen or seek emergency care

## 2016-04-11 DIAGNOSIS — H2511 Age-related nuclear cataract, right eye: Secondary | ICD-10-CM | POA: Diagnosis not present

## 2016-04-11 DIAGNOSIS — H25811 Combined forms of age-related cataract, right eye: Secondary | ICD-10-CM | POA: Diagnosis not present

## 2016-04-15 DIAGNOSIS — G8929 Other chronic pain: Secondary | ICD-10-CM | POA: Diagnosis not present

## 2016-04-15 DIAGNOSIS — M25561 Pain in right knee: Secondary | ICD-10-CM | POA: Diagnosis not present

## 2016-04-15 DIAGNOSIS — M25562 Pain in left knee: Secondary | ICD-10-CM | POA: Diagnosis not present

## 2016-04-15 DIAGNOSIS — M170 Bilateral primary osteoarthritis of knee: Secondary | ICD-10-CM | POA: Diagnosis not present

## 2016-04-22 ENCOUNTER — Other Ambulatory Visit: Payer: Self-pay | Admitting: Family Medicine

## 2016-04-22 DIAGNOSIS — E559 Vitamin D deficiency, unspecified: Secondary | ICD-10-CM

## 2016-04-25 ENCOUNTER — Encounter: Payer: Self-pay | Admitting: Internal Medicine

## 2016-04-25 ENCOUNTER — Ambulatory Visit (INDEPENDENT_AMBULATORY_CARE_PROVIDER_SITE_OTHER)
Admission: RE | Admit: 2016-04-25 | Discharge: 2016-04-25 | Disposition: A | Discharge status: Home / Self Care | Payer: Medicare Other | Source: Ambulatory Visit | Attending: Internal Medicine | Admitting: Internal Medicine

## 2016-04-25 ENCOUNTER — Ambulatory Visit (INDEPENDENT_AMBULATORY_CARE_PROVIDER_SITE_OTHER): Payer: Medicare Other | Admitting: Internal Medicine

## 2016-04-25 ENCOUNTER — Other Ambulatory Visit (INDEPENDENT_AMBULATORY_CARE_PROVIDER_SITE_OTHER): Payer: Medicare Other

## 2016-04-25 VITALS — BP 120/68 | HR 70 | Ht 63.0 in | Wt 230.4 lb

## 2016-04-25 DIAGNOSIS — J3089 Other allergic rhinitis: Secondary | ICD-10-CM | POA: Diagnosis not present

## 2016-04-25 DIAGNOSIS — J189 Pneumonia, unspecified organism: Secondary | ICD-10-CM

## 2016-04-25 DIAGNOSIS — J45909 Unspecified asthma, uncomplicated: Secondary | ICD-10-CM | POA: Diagnosis not present

## 2016-04-25 DIAGNOSIS — R918 Other nonspecific abnormal finding of lung field: Secondary | ICD-10-CM | POA: Diagnosis not present

## 2016-04-25 DIAGNOSIS — J4520 Mild intermittent asthma, uncomplicated: Secondary | ICD-10-CM

## 2016-04-25 DIAGNOSIS — J302 Other seasonal allergic rhinitis: Secondary | ICD-10-CM

## 2016-04-25 LAB — CBC WITH DIFFERENTIAL/PLATELET
Basophils Absolute: 0.1 10*3/uL (ref 0.0–0.1)
Basophils Relative: 1 % (ref 0.0–3.0)
Eosinophils Absolute: 0.3 10*3/uL (ref 0.0–0.7)
Eosinophils Relative: 3.8 % (ref 0.0–5.0)
HCT: 42.8 % (ref 36.0–46.0)
Hemoglobin: 14.5 g/dL (ref 12.0–15.0)
LYMPHS ABS: 2.7 10*3/uL (ref 0.7–4.0)
Lymphocytes Relative: 29.2 % (ref 12.0–46.0)
MCHC: 33.8 g/dL (ref 30.0–36.0)
MCV: 89.5 fl (ref 78.0–100.0)
MONO ABS: 0.7 10*3/uL (ref 0.1–1.0)
Monocytes Relative: 7.9 % (ref 3.0–12.0)
NEUTROS PCT: 58.1 % (ref 43.0–77.0)
Neutro Abs: 5.4 10*3/uL (ref 1.4–7.7)
Platelets: 296 10*3/uL (ref 150.0–400.0)
RBC: 4.79 Mil/uL (ref 3.87–5.11)
RDW: 13.3 % (ref 11.5–15.5)
WBC: 9.2 10*3/uL (ref 4.0–10.5)

## 2016-04-25 NOTE — Patient Instructions (Addendum)
Order- CXR    Dx community acquired pneumonia   Order- lab   C BC w diff, Allergy profile    Dx asthmatic bronchitis mild intermittent  You can check with your insurance formulary to see which is "preferred"  Advair, Breo, Symbicort, Dulera  Please call as needed

## 2016-04-25 NOTE — Assessment & Plan Note (Addendum)
Mild intermittent uncomplicated Rarely uses rescue inhaler but wants to keep it available. We listed comparable medications as alternatives to Advair so that she can price compare

## 2016-04-25 NOTE — Assessment & Plan Note (Signed)
We discussed availability of OTC nasal steroids and antihistamines in anticipation of tree and grass pollen rhinitis later.

## 2016-04-25 NOTE — Progress Notes (Signed)
Patient ID: Teresa Murphy, female    DOB: [birth year], 66 y.o.   MRN: [id number]  HPI female nonsmoker followed for Asthma, Allergic rhinitis, complicated by HBP Allergy profile 04/19/10- neg, IGe 3.8 She has new HVAC at home and new moisture barrier.  PFT-09/18/10- mild obstruction with response to BD. FEV1 1.95/ 92%; FEV1/FVC 0.76; FEF25-75 improved 48% w/ bronchodilator Skin test: Allergy skin tests show significant positives particularly for grass, tree and dust mite.  ---------------------------------------------------------------------------------------  09/05/2015-66 year old female nonsmoker followed for Asthma, Allergic rhinitis, complicated by HBP FOLLOWS FOR:Pt denies any troubles with breathing or allergies at this time. Has had no significant problems over the last 2 years. Continues Advair twice daily with Singulair but "never" needs rescue inhaler. Denies cough or wheeze.  04/25/2016- 66 year old female nonsmoker followed for Asthma, Allergic rhinitis, complicated by HBP LOV 2/68/34-HD-[id number] lung pneumonia after trip to [place], exacerbation asthmatic bronchitis. Treated Z-Pak then Levaquin, prednisone FOLLOWS FOR: Pt is doing well overall with breathing,  Treated for community pneumonia during the winter but feels well and fully recovered now. Cough is gone. Feels "great". No pollen associated allergic conjunctivitis or rhinitis symptoms yet. Concern price of Advair. Rare need for rescue inhaler. CXR 03/20/16 IMPRESSION: Right mid lung field infiltrate consistent pneumonia. Follow-up chest x-rays to demonstrate clearing suggested.  Review of Systems- see HPI   + = pos Constitutional:   No weight loss, night sweats,  Fevers, chills, fatigue, lassitude. HEENT:   No- headaches,  Difficulty swallowing,  Tooth/dental problems,  Sore throat,  CV:  No chest pain,  Orthopnea, PND, swelling in lower extremities, anasarca, dizziness, palpitations GI  No heartburn, indigestion,  abdominal pain, nausea, vomiting,  Resp: No shortness of breath with exertion or at rest.  No excess mucus, no productive cough,  No non-productive cough,  No coughing up of blood.  No change in color of mucus. wheezing-none.  Skin: no rash or lesions. GU:  MS:  No joint pain or swelling.  No decreased range of motion.  No back pain. Psych:  No change in mood or affect. No depression or anxiety.  No memory loss.  Objective:   Physical Exam General- Alert, Oriented, Affect-appropriate, Distress- none acute. + Overweight Skin- rash-none, lesions- none, excoriation- none Lymphadenopathy- none Head- atraumatic            Eyes- Gross vision intact, PERRLA, conjunctivae clear secretions            Ears- Hearing, canals normal            Nose- nose clear, no-Septal dev, no-polyps, erosion, perforation             Throat- Mallampati III , mucosa clear , drainage- none, tonsils- atrophic,  Neck- flexible , trachea midline, no stridor , thyroid nl, carotid no bruit Chest - symmetrical excursion , unlabored           Heart/CV- RRR , no murmur , no gallop  , no rub, nl s1 s2                           - JVD- none , edema- none, stasis changes- none, varices- none           Lung- clear to P&A, wheeze- none,  cough-none, dullness-none, rub- none           Chest wall-  Abd-  Br/ Gen/ Rectal- Not done, not indicated Extrem- cyanosis- none, clubbing, none, atrophy- none, strength- nl  Neuro- grossly intact to observation

## 2016-04-25 NOTE — Assessment & Plan Note (Signed)
Clinically full resolution of pneumonia Plan-follow-up CXR

## 2016-04-26 LAB — RESPIRATORY ALLERGY PROFILE REGION II ~~LOC~~
Allergen, C. Herbarum, M2: <0.1 kU/L
Allergen, Cedar tree, t12: <0.1 kU/L
Allergen, Comm Silver Birch, t9: <0.1 kU/L
Allergen, Mouse Urine Protein, e78: <0.1 kU/L
Allergen, Mulberry, t76: <0.1 kU/L
Allergen, Oak,t7: <0.1 kU/L
Aspergillus fumigatus, m3: <0.1 kU/L
Bermuda Grass: <0.1 kU/L
Box Elder IgE: <0.1 kU/L
Cockroach: <0.1 kU/L
Dog Dander: <0.1 kU/L
Elm IgE: <0.1 kU/L
IgE (Immunoglobulin E), Serum: 9 kU/L (ref <115)
Sheep Sorrel IgE: <0.1 kU/L

## 2016-04-30 ENCOUNTER — Telehealth: Payer: Self-pay | Admitting: Internal Medicine

## 2016-04-30 NOTE — Telephone Encounter (Signed)
Spoke with pt about her blood work per [employer]. Pt understood and had no further questions. Nothing further is needed.  Notes recorded by Deneise Lever, MD on 04/29/2016 at 9:42 AM EDT Allergy labs - all within normal limits. Blood counts are normal. There is no elevation of allergy antibody (IgE) against common environmental allergens.That suggests symptoms are from some other sort of trigger- irritants like strong odors, virus infections, etc can seem like allergy.

## 2016-04-30 NOTE — Telephone Encounter (Signed)
Patient called back states that someone returned her call - she can be reached at [phone]

## 2016-04-30 NOTE — Progress Notes (Signed)
Left message for patient to contact office for results.

## 2016-05-29 DIAGNOSIS — G8929 Other chronic pain: Secondary | ICD-10-CM | POA: Diagnosis not present

## 2016-05-29 DIAGNOSIS — M25562 Pain in left knee: Secondary | ICD-10-CM | POA: Diagnosis not present

## 2016-05-29 DIAGNOSIS — M25561 Pain in right knee: Secondary | ICD-10-CM | POA: Diagnosis not present

## 2016-06-16 ENCOUNTER — Other Ambulatory Visit: Payer: Self-pay | Admitting: Internal Medicine

## 2016-07-04 ENCOUNTER — Other Ambulatory Visit: Payer: Self-pay | Admitting: Family Medicine

## 2016-07-04 DIAGNOSIS — F411 Generalized anxiety disorder: Secondary | ICD-10-CM

## 2016-07-04 DIAGNOSIS — M170 Bilateral primary osteoarthritis of knee: Secondary | ICD-10-CM

## 2016-07-11 ENCOUNTER — Ambulatory Visit: Payer: Medicare Other | Admitting: Family Medicine

## 2016-07-13 DIAGNOSIS — J0100 Acute maxillary sinusitis, unspecified: Secondary | ICD-10-CM | POA: Diagnosis not present

## 2016-07-13 DIAGNOSIS — J4521 Mild intermittent asthma with (acute) exacerbation: Secondary | ICD-10-CM | POA: Diagnosis not present

## 2016-07-16 ENCOUNTER — Ambulatory Visit: Payer: Medicare Other | Admitting: Family Medicine

## 2016-08-21 DIAGNOSIS — H2511 Age-related nuclear cataract, right eye: Secondary | ICD-10-CM | POA: Diagnosis not present

## 2016-08-29 DIAGNOSIS — H25812 Combined forms of age-related cataract, left eye: Secondary | ICD-10-CM | POA: Diagnosis not present

## 2016-08-29 DIAGNOSIS — H2511 Age-related nuclear cataract, right eye: Secondary | ICD-10-CM | POA: Diagnosis not present

## 2016-08-29 DIAGNOSIS — H2512 Age-related nuclear cataract, left eye: Secondary | ICD-10-CM | POA: Diagnosis not present

## 2016-09-23 DIAGNOSIS — M25561 Pain in right knee: Secondary | ICD-10-CM | POA: Diagnosis not present

## 2016-09-23 DIAGNOSIS — M25562 Pain in left knee: Secondary | ICD-10-CM | POA: Diagnosis not present

## 2016-09-23 DIAGNOSIS — G8929 Other chronic pain: Secondary | ICD-10-CM | POA: Diagnosis not present

## 2016-09-23 DIAGNOSIS — M170 Bilateral primary osteoarthritis of knee: Secondary | ICD-10-CM | POA: Diagnosis not present

## 2016-09-24 DIAGNOSIS — Z0000 Encounter for general adult medical examination without abnormal findings: Secondary | ICD-10-CM | POA: Diagnosis not present

## 2016-10-16 ENCOUNTER — Other Ambulatory Visit: Payer: Self-pay | Admitting: Family Medicine

## 2016-10-16 DIAGNOSIS — E559 Vitamin D deficiency, unspecified: Secondary | ICD-10-CM

## 2016-10-24 ENCOUNTER — Encounter: Payer: Self-pay | Admitting: Family Medicine

## 2016-10-26 DIAGNOSIS — N200 Calculus of kidney: Secondary | ICD-10-CM | POA: Diagnosis not present

## 2016-10-26 DIAGNOSIS — N201 Calculus of ureter: Secondary | ICD-10-CM | POA: Diagnosis not present

## 2016-10-26 DIAGNOSIS — J45909 Unspecified asthma, uncomplicated: Secondary | ICD-10-CM | POA: Diagnosis not present

## 2016-10-26 DIAGNOSIS — I10 Essential (primary) hypertension: Secondary | ICD-10-CM | POA: Diagnosis not present

## 2016-11-11 ENCOUNTER — Other Ambulatory Visit: Payer: Self-pay

## 2016-11-11 DIAGNOSIS — E559 Vitamin D deficiency, unspecified: Secondary | ICD-10-CM

## 2016-11-11 MED ORDER — VITAMIN D (ERGOCALCIFEROL) 1.25 MG (50000 UNIT) PO CAPS: ORAL_CAPSULE | ORAL | 2 refills | Status: DC

## 2016-11-13 ENCOUNTER — Other Ambulatory Visit: Payer: Self-pay | Admitting: Family Medicine

## 2016-11-13 ENCOUNTER — Encounter: Payer: Self-pay | Admitting: Adult Health

## 2016-11-13 ENCOUNTER — Ambulatory Visit (INDEPENDENT_AMBULATORY_CARE_PROVIDER_SITE_OTHER): Payer: Medicare Other | Admitting: Adult Health

## 2016-11-13 VITALS — BP 150/92 | Temp 98.1°F | Wt 225.0 lb

## 2016-11-13 DIAGNOSIS — T2124XA Burn of second degree of lower back, initial encounter: Secondary | ICD-10-CM | POA: Diagnosis not present

## 2016-11-13 MED ORDER — CEPHALEXIN 500 MG PO CAPS: 500.0000 mg | ORAL_CAPSULE | Freq: Three times a day (TID) | ORAL | 0 refills | Status: AC

## 2016-11-13 MED ORDER — DIAZEPAM 5 MG PO TABS: 5.0000 mg | ORAL_TABLET | Freq: Every day | ORAL | 1 refills | Status: DC | PRN

## 2016-11-13 MED ORDER — SILVER SULFADIAZINE 1 % EX CREA: 1.0000 "application " | TOPICAL_CREAM | Freq: Every day | CUTANEOUS | 0 refills | Status: DC

## 2016-11-13 NOTE — Telephone Encounter (Signed)
Rx for Valium 5 mg can be called in to continue 5 mg daily as needed.  #30/1 Thanks

## 2016-11-13 NOTE — Progress Notes (Signed)
Subjective:    Patient ID: Teresa Murphy, female    DOB: 1950/10/08, 66 y.o.   MRN: [id number]  HPI  66 year old female who  has a past medical history of Allergic rhinitis; Asthma; Headache(784.0); and Hypertension. She is patient of Dr. Betty Martinique, who I am seeing today for the complaint of a burn on her lower left  back that she received from a heating pad. [place] silvadene cream about two weeks ago. She has been applying this twice daily with dressing changes. She wants to make sure the wound is not infected.   Review of Systems See HPI  Past Medical History:  Diagnosis Date  . Allergic rhinitis   . Asthma   . Headache(784.0)   . Hypertension     Social History   Social History  . Marital status: Married    Spouse name: [name]  . Number of children: 0  . Years of education: College   Occupational History  . retired    Social History Main Topics  . Smoking status: Never Smoker  . Smokeless tobacco: Never Used  . Alcohol use No  . Drug use: No  . Sexual activity: Yes    Birth control/ protection: Surgical   Other Topics Concern  . Not on file   Social History Narrative   Patient lives at home with spouse.   Caffeine Use:     Past Surgical History:  Procedure Laterality Date  . CHOLECYSTECTOMY    . TONSILLECTOMY    . TOTAL ABDOMINAL HYSTERECTOMY      Family History  Problem Relation Age of Onset  . Dementia Mother   . Multiple myeloma Mother   . Lung cancer Father   . Asthma Sister   . Sleep apnea Sister   . Heart disease Unknown   . Allergic rhinitis Unknown   . Cancer Unknown     No Known Allergies  Current Outpatient Prescriptions on File Prior to Visit  Medication Sig Dispense Refill  . albuterol (PROAIR HFA) 108 (90 Base) MCG/ACT inhaler Inhale 2 puffs into the lungs every 4 (four) hours as needed. 1 Inhaler 11  . CALCIUM-VITAMIN D PO Take 1 capsule by mouth 2 (two) times daily.      .  cetirizine (ZYRTEC) 10 MG tablet Take 10 mg by mouth daily.    . diazepam (VALIUM) 5 MG tablet 1-3 times as needed    . DULoxetine (CYMBALTA) 60 MG capsule TAKE 1 CAPSULE (60 MG TOTAL) BY MOUTH DAILY. 90 capsule 1  . Fluticasone-Salmeterol (ADVAIR DISKUS) 250-50 MCG/DOSE AEPB INHALE 1 PUFF INTO LUNGS TWICE A DAY RINSE AFTER USE 3 each 3  . Glucosamine-Chondroit-Vit C-Mn (GLUCOSAMINE CHONDR 1500 COMPLX PO) Take 1 capsule by mouth daily.      Marland Kitchen latanoprost (XALATAN) 0.005 % ophthalmic solution Place 1 drop into both eyes at bedtime.  11  . montelukast (SINGULAIR) 10 MG tablet TAKE 1 TABLET EVERY DAY 90 tablet 2  . naproxen sodium (ANAPROX) 220 MG tablet Take 220 mg by mouth 2 (two) times daily with a meal.    . olmesartan (BENICAR) 20 MG tablet Take 1 tablet (20 mg total) by mouth daily. 90 tablet 2  . vitamin B-12 (CYANOCOBALAMIN) 1000 MCG tablet Take 1,000 mcg by mouth daily.      . Vitamin D, Ergocalciferol, (DRISDOL) 50000 units CAPS capsule TAKE ONE CAPSULE BY MOUTH EVERY 5 DAYS 18 capsule 2  No current facility-administered medications on file prior to visit.     BP (!) 150/92   Temp 98.1 F (36.7 C)   Wt 225 lb (102.1 kg)   BMI 39.86 kg/m       Objective:   Physical Exam  Constitutional: She is oriented to person, place, and time. She appears well-developed and well-nourished. No distress.  Cardiovascular: Normal rate, regular rhythm, normal heart sounds and intact distal pulses.  Exam reveals no gallop and no friction rub.   No murmur heard. Pulmonary/Chest: Effort normal and breath sounds normal. No respiratory distress. She has no wheezes. She has no rales. She exhibits no tenderness.  Neurological: She is alert and oriented to person, place, and time.  Skin: Skin is warm and dry. She is not diaphoretic.  Palm sized burn on left lower back. She does have well healing pink granulized tissue surrounding wound. Has multiple areas within the wound that appear to be infected with  mucopurulent drainage noted.   Nursing note and vitals reviewed.     Assessment & Plan:  1. Partial thickness burn of back, initial encounter - Will prescribe 7 day course of Keflex. Continue to change dressings twice daily  - cephALEXin (KEFLEX) 500 MG capsule; Take 1 capsule (500 mg total) by mouth 3 (three) times daily.  Dispense: 21 capsule; Refill: 0 - silver sulfADIAZINE (SILVADENE) 1 % cream; Apply 1 application topically daily.  Dispense: 50 g; Refill: 0  Dorothyann Peng, NP

## 2016-11-15 NOTE — Telephone Encounter (Signed)
Rx phoned in.   

## 2016-12-05 DIAGNOSIS — Z23 Encounter for immunization: Secondary | ICD-10-CM | POA: Diagnosis not present

## 2016-12-07 ENCOUNTER — Emergency Department (HOSPITAL_BASED_OUTPATIENT_CLINIC_OR_DEPARTMENT_OTHER)
Admission: EM | Admit: 2016-12-07 | Discharge: 2016-12-07 | Disposition: A | Discharge status: Home / Self Care | Payer: Medicare Other | Attending: Physician Assistant | Admitting: Physician Assistant

## 2016-12-07 ENCOUNTER — Encounter (HOSPITAL_BASED_OUTPATIENT_CLINIC_OR_DEPARTMENT_OTHER): Payer: Self-pay | Admitting: Adult Health

## 2016-12-07 ENCOUNTER — Emergency Department (HOSPITAL_BASED_OUTPATIENT_CLINIC_OR_DEPARTMENT_OTHER): Payer: Medicare Other

## 2016-12-07 DIAGNOSIS — R109 Unspecified abdominal pain: Secondary | ICD-10-CM | POA: Diagnosis not present

## 2016-12-07 DIAGNOSIS — Z79899 Other long term (current) drug therapy: Secondary | ICD-10-CM | POA: Diagnosis not present

## 2016-12-07 DIAGNOSIS — J45909 Unspecified asthma, uncomplicated: Secondary | ICD-10-CM | POA: Insufficient documentation

## 2016-12-07 DIAGNOSIS — I10 Essential (primary) hypertension: Secondary | ICD-10-CM | POA: Insufficient documentation

## 2016-12-07 DIAGNOSIS — N201 Calculus of ureter: Secondary | ICD-10-CM | POA: Diagnosis not present

## 2016-12-07 DIAGNOSIS — R1032 Left lower quadrant pain: Secondary | ICD-10-CM | POA: Insufficient documentation

## 2016-12-07 LAB — CBC
HCT: 41.8 % (ref 36.0–46.0)
Hemoglobin: 14.4 g/dL (ref 12.0–15.0)
MCH: 30.3 pg (ref 26.0–34.0)
MCHC: 34.4 g/dL (ref 30.0–36.0)
MCV: 87.8 fL (ref 78.0–100.0)
PLATELETS: 228 10*3/uL (ref 150–400)
RBC: 4.76 MIL/uL (ref 3.87–5.11)
RDW: 12.9 % (ref 11.5–15.5)
WBC: 10.9 10*3/uL — ABNORMAL HIGH (ref 4.0–10.5)

## 2016-12-07 LAB — URINALYSIS, MICROSCOPIC (REFLEX)

## 2016-12-07 LAB — BASIC METABOLIC PANEL
Anion gap: 7 (ref 5–15)
BUN: 15 mg/dL (ref 6–20)
CALCIUM: 8.6 mg/dL — AB (ref 8.9–10.3)
CO2: 25 mmol/L (ref 22–32)
CREATININE: 1.06 mg/dL — AB (ref 0.44–1.00)
Chloride: 105 mmol/L (ref 101–111)
GFR, EST NON AFRICAN AMERICAN: 53 mL/min — AB (ref >60)
Glucose, Bld: 137 mg/dL — ABNORMAL HIGH (ref 65–99)
Potassium: 3.2 mmol/L — ABNORMAL LOW (ref 3.5–5.1)
SODIUM: 137 mmol/L (ref 135–145)

## 2016-12-07 LAB — URINALYSIS, ROUTINE W REFLEX MICROSCOPIC
BILIRUBIN URINE: NEGATIVE
Glucose, UA: NEGATIVE mg/dL
KETONES UR: 15 mg/dL — AB
Leukocytes, UA: NEGATIVE
NITRITE: NEGATIVE
PROTEIN: NEGATIVE mg/dL
SPECIFIC GRAVITY, URINE: 1.01 (ref 1.005–1.030)
pH: 6.5 (ref 5.0–8.0)

## 2016-12-07 MED ORDER — ONDANSETRON HCL 4 MG/2ML IJ SOLN
4.0000 mg | Freq: Once | INTRAMUSCULAR | Status: AC
  Administered 2016-12-07: 4 mg via INTRAVENOUS
  Filled 2016-12-07: qty 2

## 2016-12-07 MED ORDER — SODIUM CHLORIDE 0.9 % IV BOLUS (SEPSIS)
1000.0000 mL | Freq: Once | INTRAVENOUS | Status: AC
  Administered 2016-12-07: 1000 mL via INTRAVENOUS

## 2016-12-07 MED ORDER — KETOROLAC TROMETHAMINE 30 MG/ML IJ SOLN
15.0000 mg | Freq: Once | INTRAMUSCULAR | Status: AC
  Administered 2016-12-07: 15 mg via INTRAVENOUS
  Filled 2016-12-07: qty 1

## 2016-12-07 NOTE — ED Provider Notes (Signed)
[place] EMERGENCY DEPARTMENT Provider Note   CSN: [id number] Arrival date & time: 12/07/16  1058     History   Chief Complaint Chief Complaint  Patient presents with  . Flank Pain    Left    HPI ADRIAN SPECHT is a 66 y.o. female.  HPI  66 y.o. female with a hx of HTN, presents to the Emergency Department today due to left flank pain. Notes hx same around the end of September. Diagnosed with Kidney stone at [facility]. Notes feeling better, but worsening pain on the 14th that is intermittent. Notes continued symptoms. Worsening pain occurred around 0500 this morning. Associated N/V. Notes pain 10/10 and constant with sharp sensation isolated to left flank. BMs normal and unchanged. Notes decrease in urinary output. No dysuria. No fevers. No CP/SOB/ABD pain. No other symptoms noted    Past Medical History:  Diagnosis Date  . Allergic rhinitis   . Asthma   . Headache(784.0)   . Hypertension     Patient Active Problem List   Diagnosis Date Noted  . CAP (community acquired pneumonia) 03/20/2016  . BMI 40.0-44.9, adult ([place]) 01/11/2016  . Vitamin D deficiency 07/10/2015  . Anxiety disorder 07/10/2015  . Osteoarthritis of both knees 07/10/2015  . Essential hypertension 04/21/2010  . Seasonal and perennial allergic rhinitis 04/19/2010  . Asthma with bronchitis 04/19/2010    Past Surgical History:  Procedure Laterality Date  . CHOLECYSTECTOMY    . TONSILLECTOMY    . TOTAL ABDOMINAL HYSTERECTOMY      OB History    No data available       Home Medications    Prior to Admission medications   Medication Sig Start Date End Date Taking? Authorizing Provider  albuterol (PROAIR HFA) 108 (90 Base) MCG/ACT inhaler Inhale 2 puffs into the lungs every 4 (four) hours as needed. 09/05/15   Baird Lyons D, MD  CALCIUM-VITAMIN D PO Take 1 capsule by mouth 2 (two) times daily.      [provider]  cetirizine (ZYRTEC) 10 MG tablet Take 10 mg by mouth  daily.    [provider]  diazepam (VALIUM) 5 MG tablet Take 1 tablet (5 mg total) by mouth daily as needed for anxiety. 1-3 times as needed 11/13/16   Martinique, [name], MD  DULoxetine (CYMBALTA) 60 MG capsule TAKE 1 CAPSULE (60 MG TOTAL) BY MOUTH DAILY. 07/04/16   [place], [name], MD  Fluticasone-Salmeterol (ADVAIR DISKUS) 250-50 MCG/DOSE AEPB INHALE 1 PUFF INTO LUNGS TWICE A DAY RINSE AFTER USE 02/13/16   Deneise Lever, MD  Glucosamine-Chondroit-Vit C-Mn (GLUCOSAMINE CHONDR 1500 COMPLX PO) Take 1 capsule by mouth daily.      [provider]  latanoprost (XALATAN) 0.005 % ophthalmic solution Place 1 drop into both eyes at bedtime. 06/21/15   [provider]  montelukast (SINGULAIR) 10 MG tablet TAKE 1 TABLET EVERY DAY 06/17/16   Baird Lyons D, MD  naproxen sodium (ANAPROX) 220 MG tablet Take 220 mg by mouth 2 (two) times daily with a meal.    [provider]  olmesartan (BENICAR) 20 MG tablet Take 1 tablet (20 mg total) by mouth daily. 01/11/16   Martinique, [name], MD  silver sulfADIAZINE (SILVADENE) 1 % cream Apply 1 application topically daily. 11/13/16   Nafziger, Tommi Rumps, NP  vitamin B-12 (CYANOCOBALAMIN) 1000 MCG tablet Take 1,000 mcg by mouth daily.      [provider]  Vitamin D, Ergocalciferol, (DRISDOL) 50000 units CAPS capsule  TAKE ONE CAPSULE BY MOUTH EVERY 5 DAYS 11/11/16   Martinique, [name], MD    Family History Family History  Problem Relation Age of Onset  . Dementia Mother   . Multiple myeloma Mother   . Lung cancer Father   . Asthma Sister   . Sleep apnea Sister   . Heart disease Unknown   . Allergic rhinitis Unknown   . Cancer Unknown     Social History Social History  Substance Use Topics  . Smoking status: Never Smoker  . Smokeless tobacco: Never Used  . Alcohol use No     Allergies   Patient has no known allergies.   Review of Systems Review of Systems ROS reviewed and all are negative for acute change except as  noted in the HPI.  Physical Exam Updated Vital Signs BP 125/74   Pulse 69   Temp 98.5 F (36.9 C) (Oral)   Resp 18   SpO2 98%   Physical Exam  Constitutional: She is oriented to person, place, and time. She appears well-developed and well-nourished. No distress.  HENT:  Head: Normocephalic and atraumatic.  Right Ear: Tympanic membrane, external ear and ear canal normal.  Left Ear: Tympanic membrane, external ear and ear canal normal.  Nose: Nose normal.  Mouth/Throat: Uvula is midline, oropharynx is clear and moist and mucous membranes are normal. No trismus in the jaw. No oropharyngeal exudate, posterior oropharyngeal erythema or tonsillar abscesses.  Eyes: Pupils are equal, round, and reactive to light. EOM are normal.  Neck: Normal range of motion. Neck supple. No tracheal deviation present.  Cardiovascular: Normal rate, regular rhythm, S1 normal, S2 normal, normal heart sounds, intact distal pulses and normal pulses.   Pulmonary/Chest: Effort normal and breath sounds normal. No respiratory distress. She has no decreased breath sounds. She has no wheezes. She has no rhonchi. She has no rales.  Abdominal: Normal appearance and bowel sounds are normal. There is no tenderness. There is no rigidity, no rebound, no guarding, no CVA tenderness, no tenderness at McBurney's point and negative Murphy's sign.  Musculoskeletal: Normal range of motion.  Neurological: She is alert and oriented to person, place, and time.  Skin: Skin is warm and dry.  Psychiatric: She has a normal mood and affect. Her speech is normal and behavior is normal. Thought content normal.     ED Treatments / Results  Labs (all labs ordered are listed, but only abnormal results are displayed) Labs Reviewed  CBC - Abnormal; Notable for the following:       Result Value   WBC 10.9 (*)    All other components within normal limits  BASIC METABOLIC PANEL - Abnormal; Notable for the following:    Potassium 3.2 (*)     Glucose, Bld 137 (*)    Creatinine, Ser 1.06 (*)    Calcium 8.6 (*)    GFR calc non Af Amer 53 (*)    All other components within normal limits  URINALYSIS, ROUTINE W REFLEX MICROSCOPIC - Abnormal; Notable for the following:    Hgb urine dipstick LARGE (*)    Ketones, ur 15 (*)    All other components within normal limits  URINALYSIS, MICROSCOPIC (REFLEX) - Abnormal; Notable for the following:    Bacteria, UA FEW (*)    Squamous Epithelial / LPF 0-5 (*)    All other components within normal limits    EKG  EKG Interpretation None       Radiology Ct Renal Stone Study  Result Date: 12/07/2016 CLINICAL DATA:  66 year old female with acute onset left flank pain and associated nausea vomiting. Fever and chills was am. History of renal stones. EXAM: CT ABDOMEN AND PELVIS WITHOUT CONTRAST TECHNIQUE: Multidetector CT imaging of the abdomen and pelvis was performed following the standard protocol without IV contrast. COMPARISON:  None. FINDINGS: Lower chest: No acute abnormality. Hepatobiliary: No focal liver abnormality is seen. Status post cholecystectomy. No biliary dilatation. Pancreas: Unremarkable. No pancreatic ductal dilatation or surrounding inflammatory changes. Spleen: Normal in size without focal abnormality. Adrenals/Urinary Tract: Adrenal glands are unremarkable. A 1-2 mm hyperdensity which may represent a recently passed stone is identified in bladder adjacent to the left UVJ. There is mild ureteral dilatation and moderate-to-marked perinephric fat stranding on the left. There is mild perinephric fat stranding on the right. No significant hydronephrosis. No mass lesions identified. Stomach/Bowel: Stomach is within normal limits. Appendix appears normal. No evidence of bowel wall thickening, distention, or inflammatory changes. Vascular/Lymphatic: No significant vascular findings are present. No enlarged abdominal or pelvic lymph nodes. Reproductive: Status post hysterectomy. No adnexal  masses. Other: No abdominal wall hernia or abnormality. No abdominopelvic ascites. Musculoskeletal: No acute or significant osseous findings. IMPRESSION: 1. 1-2 mm urinary bladder hyperdensity which may represent a recently passed stone adjacent to the left UVJ with minimal associated upstream left ureteral dilatation. 2. There is additional moderate to marked left-sided and mild right-sided perinephric fat stranding without significant hydronephrosis bilaterally. Correlate with UA and consider pyelonephritis. 3. Status post cholecystectomy and hysterectomy. Electronically Signed   By: Kristopher Oppenheim M.D.   On: 12/07/2016 12:22    Procedures Procedures (including critical care time)  Medications Ordered in ED Medications  sodium chloride 0.9 % bolus 1,000 mL (1,000 mLs Intravenous New Bag/Given 12/07/16 1152)  ketorolac (TORADOL) 30 MG/ML injection 15 mg (15 mg Intravenous Given 12/07/16 1153)  ondansetron (ZOFRAN) injection 4 mg (4 mg Intravenous Given 12/07/16 1153)     Initial Impression / Assessment and Plan / ED Course  I have reviewed the triage vital signs and the nursing notes.  Pertinent labs & imaging results that were available during my care of the patient were reviewed by me and considered in my medical decision making (see chart for details).  Final Clinical Impressions(s) / ED Diagnoses  {I have reviewed and evaluated the relevant laboratory values. {I have reviewed and evaluated the relevant imaging studies.  {I have reviewed the relevant previous healthcare records.  {I obtained HPI from historian.   ED Course:  Assessment: Pt is a 66 y.o. female  with a hx of HTN, presents to the Emergency Department today due to left flank pain. Notes hx same around the end of September. Diagnosed with Kidney stone at [facility]. Notes feeling better, but worsening pain on the 14th that is intermittent. Notes continued symptoms. Worsening pain occurred around 0500 this morning. Associated  N/V. Notes pain 10/10 and constant with sharp sensation isolated to left flank. BMs normal and unchanged. Notes decrease in urinary output. No dysuria. No fevers. No CP/SOB/ABD pain. On exam, pt in NAD. Nontoxic/nonseptic appearing. VSS. Afebrile. Lungs CTA. Heart RRR. Abdomen nontender soft. CBC with mild leukocytosis. BMP unremarkable. UA without evidence of UTI CT Renal with likely recently passed 1-20m stone. Mild right perinephric stranding. Given fluids and analgesia in ED. Plan is to DC home with follow up to Urology. At time of discharge, Patient is in no acute distress. Vital Signs are stable. Patient is able to ambulate. Patient able to  tolerate PO.   Disposition/Plan:  DC Home Additional Verbal discharge instructions given and discussed with patient.  Pt Instructed to f/u with Urologye  in the next week for evaluation and treatment of symptoms. Return precautions given Pt acknowledges and agrees with plan  Supervising Physician Mackuen, Courteney Lyn, *  Final diagnoses:  Left flank pain    New Prescriptions New Prescriptions   No medications on file     Shary Decamp, Hershal Coria 12/07/16 1313    Macarthur Critchley, MD 12/07/16 1344

## 2016-12-07 NOTE — ED Triage Notes (Signed)
Patient was diagnosed with left kidney in Oct 2018 at [place], [place].  States she started feeling better after a few days.  On Oct 14th she developed more pain that has been intermittent.  States this morning at 0500 she developed worsening left flank pain that is associated with nausea and vomiting.

## 2016-12-07 NOTE — ED Notes (Signed)
ED Provider at bedside. 

## 2016-12-07 NOTE — Discharge Instructions (Signed)
Please read and follow all provided instructions.  Your diagnoses today include:  1. Left flank pain     Tests performed today include: Vital signs. See below for your results today.   Medications prescribed:  Take as prescribed   Home care instructions:  Follow any educational materials contained in this packet.  Follow-up instructions: Please follow-up with your Urology for further evaluation of symptoms and treatment   Return instructions:  Please return to the Emergency Department if you do not get better, if you get worse, or new symptoms OR  - Fever (temperature greater than 101.2F)  - Bleeding that does not stop with holding pressure to the area    -Severe pain (please note that you may be more sore the day after your accident)  - Chest Pain  - Difficulty breathing  - Severe nausea or vomiting  - Inability to tolerate food and liquids  - Passing out  - Skin becoming red around your wounds  - Change in mental status (confusion or lethargy)  - New numbness or weakness    Please return if you have any other emergent concerns.  Additional Information:  Your vital signs today were: BP 125/74    Pulse 69    Temp 98.5 F (36.9 C) (Oral)    Resp 18    SpO2 98%  If your blood pressure (BP) was elevated above 135/85 this visit, please have this repeated by your doctor within one month. ---------------

## 2016-12-08 LAB — URINE CULTURE: Culture: NO GROWTH

## 2016-12-25 DIAGNOSIS — M170 Bilateral primary osteoarthritis of knee: Secondary | ICD-10-CM | POA: Diagnosis not present

## 2016-12-25 DIAGNOSIS — M25562 Pain in left knee: Secondary | ICD-10-CM | POA: Diagnosis not present

## 2016-12-25 DIAGNOSIS — M25561 Pain in right knee: Secondary | ICD-10-CM | POA: Diagnosis not present

## 2016-12-25 DIAGNOSIS — G8929 Other chronic pain: Secondary | ICD-10-CM | POA: Diagnosis not present

## 2017-03-09 ENCOUNTER — Other Ambulatory Visit: Payer: Self-pay | Admitting: Internal Medicine

## 2017-03-09 ENCOUNTER — Other Ambulatory Visit: Payer: Self-pay | Admitting: Family Medicine

## 2017-03-09 DIAGNOSIS — I10 Essential (primary) hypertension: Secondary | ICD-10-CM

## 2017-04-02 DIAGNOSIS — M1712 Unilateral primary osteoarthritis, left knee: Secondary | ICD-10-CM | POA: Diagnosis not present

## 2017-04-02 DIAGNOSIS — M25561 Pain in right knee: Secondary | ICD-10-CM | POA: Diagnosis not present

## 2017-04-02 DIAGNOSIS — M170 Bilateral primary osteoarthritis of knee: Secondary | ICD-10-CM | POA: Diagnosis not present

## 2017-04-02 DIAGNOSIS — M25562 Pain in left knee: Secondary | ICD-10-CM | POA: Diagnosis not present

## 2017-04-02 DIAGNOSIS — M1711 Unilateral primary osteoarthritis, right knee: Secondary | ICD-10-CM | POA: Diagnosis not present

## 2017-04-25 ENCOUNTER — Ambulatory Visit: Payer: Medicare Other | Admitting: Internal Medicine

## 2017-05-01 ENCOUNTER — Encounter: Payer: Self-pay | Admitting: Internal Medicine

## 2017-05-01 ENCOUNTER — Ambulatory Visit (INDEPENDENT_AMBULATORY_CARE_PROVIDER_SITE_OTHER): Payer: Medicare Other | Admitting: Internal Medicine

## 2017-05-01 DIAGNOSIS — J3089 Other allergic rhinitis: Secondary | ICD-10-CM

## 2017-05-01 DIAGNOSIS — J45909 Unspecified asthma, uncomplicated: Secondary | ICD-10-CM | POA: Diagnosis not present

## 2017-05-01 DIAGNOSIS — Z6841 Body Mass Index (BMI) 40.0 and over, adult: Secondary | ICD-10-CM | POA: Diagnosis not present

## 2017-05-01 DIAGNOSIS — J302 Other seasonal allergic rhinitis: Secondary | ICD-10-CM | POA: Diagnosis not present

## 2017-05-01 NOTE — Progress Notes (Signed)
Patient ID: Teresa Murphy, female    DOB: 1950-04-28, 68 y.o.   MRN: [id number]  HPI female nonsmoker followed for Asthma, Allergic rhinitis, complicated by HBP Allergy profile 04/19/10- neg, IGe 3.8 She has new HVAC at home and new moisture barrier.  PFT-09/18/10- mild obstruction with response to BD. FEV1 1.95/ 92%; FEV1/FVC 0.76; FEF25-75 improved 48% w/ bronchodilator Skin test: Allergy skin tests show significant positives particularly for grass, tree and dust mite.  ---------------------------------------------------------------------------------------  04/25/2016- 67 year old female nonsmoker followed for Asthma, Allergic rhinitis, complicated by HBP LOV 8/46/96-[id number] lung pneumonia after trip to [place], exacerbation asthmatic bronchitis. Treated Z-Pak then Levaquin, prednisone FOLLOWS FOR: Pt is doing well overall with breathing,  Treated for community pneumonia during the winter but feels well and fully recovered now. Cough is gone. Feels "great". No pollen associated allergic conjunctivitis or rhinitis symptoms yet. Concern price of Advair. Rare need for rescue inhaler. CXR 03/20/16 IMPRESSION: Right mid lung field infiltrate consistent pneumonia. Follow-up chest x-rays to demonstrate clearing suggested.  05/01/2017- 67 year old female nonsmoker followed for Asthma, Allergic rhinitis, complicated by HBP ----Asthma: Pt states her breathing is doing well; denies any wheezing, cough or congestion at this time.  Singulair, Advair 250, ProAir hfa Had some mold and pollen exposure where [place] the [place] but no major problems.  Medications work well.  No major exacerbations.  Occasional use of rescue inhaler with no sleep disturbance. Spring pollen has been heavy but rhinitis symptoms have also been controlled. CXR 04/25/2016- Near complete resolution of right upper lobe infiltrate when compared with the prior exam  Review of Systems- see HPI   + =  pos Constitutional:   No weight loss, night sweats,  Fevers, chills, fatigue, lassitude. HEENT:   No- headaches,  Difficulty swallowing,  Tooth/dental problems,  Sore throat,  CV:  No chest pain,  Orthopnea, PND, swelling in lower extremities, anasarca, dizziness, palpitations GI  No heartburn, indigestion, abdominal pain, nausea, vomiting,  Resp: No shortness of breath with exertion or at rest.  No excess mucus, no productive cough,  No non-productive cough,  No coughing up of blood.  No change in color of mucus. wheezing-none.  Skin: no rash or lesions. GU:  MS:  No joint pain or swelling.  No decreased range of motion.  No back pain. Psych:  No change in mood or affect. No depression or anxiety.  No memory loss.  Objective:   Physical Exam General- Alert, Oriented, Affect-appropriate, Distress- none acute. + Overweight Skin- rash-none, lesions- none, excoriation- none Lymphadenopathy- none Head- atraumatic            Eyes- Gross vision intact, PERRLA, conjunctivae clear secretions            Ears- Hearing, canals normal            Nose- nose clear, no-Septal dev, no-polyps, erosion, perforation             Throat- Mallampati III , mucosa clear , drainage- none, tonsils- atrophic,  Neck- flexible , trachea midline, no stridor , thyroid nl, carotid no bruit Chest - symmetrical excursion , unlabored           Heart/CV- RRR , no murmur , no gallop  , no rub, nl s1 s2                           - JVD- none , edema- none, stasis changes- none, varices- none  Lung- clear to P&A, wheeze- none,  cough-none, dullness-none, rub- none           Chest wall-  Abd-  Br/ Gen/ Rectal- Not done, not indicated Extrem- cyanosis- none, clubbing, none, atrophy- none, strength- nl Neuro- grossly intact to observation

## 2017-05-01 NOTE — Patient Instructions (Signed)
We can continue current meds  Please call if we can help 

## 2017-05-05 NOTE — Assessment & Plan Note (Signed)
Continued efforts to keep weight down and maintain stamina are encouraged.

## 2017-05-05 NOTE — Assessment & Plan Note (Signed)
No major exacerbation yet.  We discussed Flonase, Claritin, Sudafed.

## 2017-05-05 NOTE — Assessment & Plan Note (Signed)
Mild intermittent well controlled.  She is satisfied with current meds.  We discussed pollen season and summer humidity potential issues. Plan-refill meds when needed.

## 2017-06-03 ENCOUNTER — Telehealth: Payer: Self-pay | Admitting: *Deleted

## 2017-06-03 NOTE — Telephone Encounter (Signed)
Requesting alternative for Olmesartan Medoxomil 20 mg tab take 1 tab by mouth every day, Pt paying $111, for OLMES, requesting lower out of pocket cost. Suggested Alternatives: Irbesartan 150 mg $25, Valsartan 160 mg, $54, Telmisartan 40 mg, $81

## 2017-06-04 NOTE — Telephone Encounter (Signed)
Benicar can be changed to irbesartan 150 mg daily. Monitor BP. Nurse visit in 3 to 4 weeks for BP check.  Thanks, Teresa Murphy

## 2017-06-06 NOTE — Telephone Encounter (Signed)
Spoke with patient and she stated that she will wait until she come from out of town to decide whether or not she wants to change her blood pressure medication. She stated that she will be back in a few weeks and she she has enough to last and she will call and let us know what she decide because she is worried that new medication may not be as effective as her current medication and she can get current medication cheaper at a different pharmacy.

## 2017-07-01 ENCOUNTER — Telehealth: Payer: Self-pay | Admitting: *Deleted

## 2017-07-01 DIAGNOSIS — M1711 Unilateral primary osteoarthritis, right knee: Secondary | ICD-10-CM | POA: Diagnosis not present

## 2017-07-01 DIAGNOSIS — M1712 Unilateral primary osteoarthritis, left knee: Secondary | ICD-10-CM | POA: Diagnosis not present

## 2017-07-01 NOTE — Telephone Encounter (Signed)
Olmesartan  20 mg tab, take 1 tab by mouth once daily  Note from pharmacy: medication on back order, can you send in alternative

## 2017-07-04 NOTE — Telephone Encounter (Signed)
Recently she was changed from [place]. Avapro 150 mg daily. Follow-up in 4 weeks.  Thanks, BJ

## 2017-07-04 NOTE — Telephone Encounter (Signed)
Disregard message, patient stated that she was out of town and did not call pharmacy for a refill, she still had medication left to take and would call office when she returned if she wanted to change medication. No further assistance needed at this time.

## 2017-09-03 DIAGNOSIS — Z6839 Body mass index (BMI) 39.0-39.9, adult: Secondary | ICD-10-CM | POA: Diagnosis not present

## 2017-09-03 DIAGNOSIS — M25561 Pain in right knee: Secondary | ICD-10-CM | POA: Diagnosis not present

## 2017-09-03 DIAGNOSIS — S8991XA Unspecified injury of right lower leg, initial encounter: Secondary | ICD-10-CM | POA: Diagnosis not present

## 2017-09-28 NOTE — Progress Notes (Signed)
HPI:   TeresaTeresa Teresa Murphy is a 67 y.o. female, who is here today for follow up.   She was last seen in 01/2016.  Hypertension:   Currently on Benicar 20 mg daily.  She is taking medications as instructed, no side effects reported.  Shehas not noted unusual headache, visual changes, exertional chest pain, dyspnea,  focal weakness, or edema.  Lab Results  Component Value Date   CREATININE 1.06 (H) 12/07/2016   BUN 15 12/07/2016   NA 137 12/07/2016   K 3.2 (L) 12/07/2016   CL 105 12/07/2016   CO2 25 12/07/2016     Vit D def,she is on Ergocalciferol 50,000 U q 5 days.   Knee pain,she is following with ortho.Receiving intraarticular knee injections, which helps temporarily. According to pt,TKR has been recommended,she has not been able to do so because her husband is having health issues.    She has not been consistent with a healthy diet. She is not exercising regularly.  Anxiety: She is on Valium 5 mg daily as needed. Effexor was changed to Cymbalta, trying to help with OA. She is tolerating medication well, no side effects. Medication is helping with anxiety. Denies suicidal thoughts.      Review of Systems  Constitutional: Negative for activity change, appetite change, fatigue and fever.  HENT: Negative for mouth sores, nosebleeds and trouble swallowing.   Eyes: Negative for redness and visual disturbance.  Respiratory: Negative for cough, shortness of breath and wheezing.   Cardiovascular: Negative for chest pain, palpitations and leg swelling.  Gastrointestinal: Negative for abdominal pain, nausea and vomiting.       Negative for changes in bowel habits.  Genitourinary: Negative for decreased urine volume, dysuria and hematuria.  Musculoskeletal: Positive for arthralgias and gait problem.  Neurological: Negative for syncope, weakness and headaches.  Psychiatric/Behavioral: Negative for confusion and suicidal ideas. The patient is nervous/anxious.      Current Outpatient Medications on File Prior to Visit  Medication Sig Dispense Refill  . albuterol (PROAIR HFA) 108 (90 Base) MCG/ACT inhaler Inhale 2 puffs into the lungs every 4 (four) hours as needed. 1 Inhaler 11  . CALCIUM-VITAMIN D PO Take 1 capsule by mouth 2 (two) times daily.      . cetirizine (ZYRTEC) 10 MG tablet Take 10 mg by mouth daily.    . Fluticasone-Salmeterol (ADVAIR DISKUS) 250-50 MCG/DOSE AEPB INHALE 1 PUFF INTO LUNGS TWICE A DAY RINSE AFTER USE 3 each 3  . latanoprost (XALATAN) 0.005 % ophthalmic solution Place 1 drop into both eyes at bedtime.  11  . montelukast (SINGULAIR) 10 MG tablet TAKE 1 TABLET BY MOUTH EVERY DAY 90 tablet 2  . naproxen sodium (ANAPROX) 220 MG tablet Take 220 mg by mouth 2 (two) times daily with a meal.    . vitamin B-12 (CYANOCOBALAMIN) 1000 MCG tablet Take 1,000 mcg by mouth daily.      Marland Kitchen azelastine (ASTELIN) 0.1 % nasal spray Place into the nose.     No current facility-administered medications on file prior to visit.      Past Medical History:  Diagnosis Date  . Allergic rhinitis   . Asthma   . Headache(784.0)   . Hypertension    No Known Allergies  Social History   Socioeconomic History  . Marital status: Married    Spouse name: [name]  . Number of children: 0  . Years of education: College  . Highest education level: Not on file  Occupational History  .  Occupation: retired  Scientific laboratory technician  . Financial resource strain: Not on file  . Food insecurity:    Worry: Not on file    Inability: Not on file  . Transportation needs:    Medical: Not on file    Non-medical: Not on file  Tobacco Use  . Smoking status: Never Smoker  . Smokeless tobacco: Never Used  Substance and Sexual Activity  . Alcohol use: No    Alcohol/week: 0.0 standard drinks  . Drug use: No  . Sexual activity: Yes    Birth control/protection: Surgical  Lifestyle  . Physical activity:    Days per week: Not on file    Minutes per session: Not on  file  . Stress: Not on file  Relationships  . Social connections:    Talks on phone: Not on file    Gets together: Not on file    Attends religious service: Not on file    Active member of club or organization: Not on file    Attends meetings of clubs or organizations: Not on file    Relationship status: Not on file  Other Topics Concern  . Not on file  Social History Narrative   Patient lives at home with spouse.   Caffeine Use:     Vitals:   09/29/17 1413  BP: 138/82  Pulse: 70  Resp: 12  Temp: 98.5 F (36.9 C)  SpO2: 95%   Body mass index is 40.42 kg/m.  Wt Readings from Last 3 Encounters:  09/29/17 221 lb (100.2 kg)  05/01/17 229 lb 9.6 oz (104.1 kg)  11/13/16 225 lb (102.1 kg)    Physical Exam  Nursing note and vitals reviewed. Constitutional: She is oriented to person, place, and time. She appears well-developed. No distress.  HENT:  Head: Normocephalic and atraumatic.  Mouth/Throat: Oropharynx is clear and moist and mucous membranes are normal.  Eyes: Pupils are equal, round, and reactive to light. Conjunctivae are normal.  Cardiovascular: Normal rate and regular rhythm.  No murmur heard. Pulses:      Dorsalis pedis pulses are 2+ on the right side, and 2+ on the left side.  Respiratory: Effort normal and breath sounds normal. No respiratory distress.  GI: Soft. She exhibits no mass. There is no hepatomegaly. There is no tenderness.  Musculoskeletal: She exhibits no edema.  Knee crepitus upon flexion and extension ,bilateral. Pain elicited. Antalgic gait.   Lymphadenopathy:    She has no cervical adenopathy.  Neurological: She is alert and oriented to person, place, and time. She has normal strength. No cranial nerve deficit.  Skin: Skin is warm. No rash noted. No erythema.  Psychiatric: She has a normal mood and affect.  Well groomed, good eye contact.     ASSESSMENT AND PLAN:   Teresa Teresa Murphy Teresa Murphy was seen today for follow-up.  Orders Placed  This Encounter  Procedures  . Basic metabolic panel  . VITAMIN D 25 Hydroxy (Vit-D Deficiency, Fractures)   Lab Results  Component Value Date   CREATININE 0.73 09/29/2017   BUN 17 09/29/2017   NA 142 09/29/2017   K 4.3 09/29/2017   CL 103 09/29/2017   CO2 30 09/29/2017     Vitamin D deficiency  No changes in current management, will follow labs done today and will give further recommendations accordingly.  -     VITAMIN D 25 Hydroxy (Vit-D Deficiency, Fractures) -     Vitamin D, Ergocalciferol, (DRISDOL) 50000 units CAPS capsule; TAKE ONE CAPSULE BY  MOUTH EVERY 5 DAYS  Essential hypertension  Overall adequately controlled. No changes in current management. DASH and low salt diet recommended. Eye exam recommended annually. F/U in 6 months, before if needed.  -     Basic metabolic panel -     olmesartan (BENICAR) 20 MG tablet; Take 1 tablet (20 mg total) by mouth daily.  Generalized anxiety disorder  Well controlled. Some side effects of benzo's discussed. No changes in current management. F/U in 6 months.  -     diazepam (VALIUM) 5 MG tablet; Take 1 tablet (5 mg total) by mouth daily as needed for anxiety. -     DULoxetine (CYMBALTA) 60 MG capsule; Take 1 capsule (60 mg total) by mouth daily.  Osteoarthritis of both knees, unspecified osteoarthritis type  No changes in Cymbalta. She is following with ortho. Wt loss may help.  -     DULoxetine (CYMBALTA) 60 MG capsule; Take 1 capsule (60 mg total) by mouth daily.   Severe obesity (BMI 35.0-39.9) with comorbidity ([place])  Since her last OV in 01/2016 she has lost about 8 Lb. We discussed benefits of wt loss as well as adverse effects of obesity. Consistency with healthy diet and physical activity recommended.      [name]. Martinique, MD  [facility]. [place] office.

## 2017-09-29 ENCOUNTER — Ambulatory Visit (INDEPENDENT_AMBULATORY_CARE_PROVIDER_SITE_OTHER): Payer: Medicare Other | Admitting: Family Medicine

## 2017-09-29 ENCOUNTER — Encounter: Payer: Self-pay | Admitting: Family Medicine

## 2017-09-29 VITALS — BP 138/82 | HR 70 | Temp 98.5°F | Resp 12 | Ht 62.0 in | Wt 221.0 lb

## 2017-09-29 DIAGNOSIS — E559 Vitamin D deficiency, unspecified: Secondary | ICD-10-CM

## 2017-09-29 DIAGNOSIS — I10 Essential (primary) hypertension: Secondary | ICD-10-CM | POA: Diagnosis not present

## 2017-09-29 DIAGNOSIS — F411 Generalized anxiety disorder: Secondary | ICD-10-CM | POA: Diagnosis not present

## 2017-09-29 DIAGNOSIS — M170 Bilateral primary osteoarthritis of knee: Secondary | ICD-10-CM

## 2017-09-29 LAB — BASIC METABOLIC PANEL
BUN: 17 mg/dL (ref 6–23)
CHLORIDE: 103 meq/L (ref 96–112)
CO2: 30 mEq/L (ref 19–32)
CREATININE: 0.73 mg/dL (ref 0.40–1.20)
Calcium: 9.6 mg/dL (ref 8.4–10.5)
GFR: 83.88 mL/min (ref >60.00)
GLUCOSE: 86 mg/dL (ref 70–99)
Potassium: 4.3 mEq/L (ref 3.5–5.1)
Sodium: 142 mEq/L (ref 135–145)

## 2017-09-29 LAB — VITAMIN D 25 HYDROXY (VIT D DEFICIENCY, FRACTURES): VITD: 40.68 ng/mL (ref 30.00–100.00)

## 2017-09-29 MED ORDER — OLMESARTAN MEDOXOMIL 20 MG PO TABS
20.0000 mg | ORAL_TABLET | Freq: Every day | ORAL | 2 refills | Status: DC
Start: 2017-09-29 — End: 2018-05-18

## 2017-09-29 NOTE — Patient Instructions (Addendum)
A few things to remember from today's visit:   Vitamin D deficiency - Plan: VITAMIN D 25 Hydroxy (Vit-D Deficiency, Fractures)  Generalized anxiety disorder  Essential hypertension - Plan: Basic metabolic panel, olmesartan (BENICAR) 20 MG tablet  Osteoarthritis of both knees, unspecified osteoarthritis type  I would like for you you to schedule a Medicare Annual Wellness Visit (AWV).   This is a yearly appointment with our Health Coach Wynetta Fines, RN) and is designed to develop a personalized prevention plan. This is not a head to toe physical, but rather an opportunity to prevent illness based on your current health and risk factors for disease.   Visits usually last 30-60 minutes and include various screenings for hearing, vision, depression, and dementia, falls, and safety concerns. The visit also includes diet and exercise counseling and information about advance directives.   This is also an opportunity to discuss appropriate health maintenance testing such as mammography, colonoscopy, lung cancer screening, and hepatitis C testing.   The AWV is fully covered by Medicare Part B if:  . You have had Part B for over 12 months, AND . You have not had an AWV in the past 12 months .  Please don't miss out on this opportunity! Set up your appointment today!   Please be sure medication list is accurate. If a new problem present, please set up appointment sooner than planned today.

## 2017-09-30 ENCOUNTER — Encounter: Payer: Self-pay | Admitting: Family Medicine

## 2017-10-01 DIAGNOSIS — M1711 Unilateral primary osteoarthritis, right knee: Secondary | ICD-10-CM | POA: Diagnosis not present

## 2017-10-01 DIAGNOSIS — M1712 Unilateral primary osteoarthritis, left knee: Secondary | ICD-10-CM | POA: Diagnosis not present

## 2017-10-01 DIAGNOSIS — M25561 Pain in right knee: Secondary | ICD-10-CM | POA: Diagnosis not present

## 2017-10-01 DIAGNOSIS — M25562 Pain in left knee: Secondary | ICD-10-CM | POA: Diagnosis not present

## 2017-10-02 ENCOUNTER — Encounter: Payer: Self-pay | Admitting: Family Medicine

## 2017-10-02 MED ORDER — DULOXETINE HCL 60 MG PO CPEP: 60.0000 mg | ORAL_CAPSULE | Freq: Every day | ORAL | 3 refills | Status: DC

## 2017-10-02 MED ORDER — VITAMIN D (ERGOCALCIFEROL) 1.25 MG (50000 UNIT) PO CAPS: ORAL_CAPSULE | ORAL | 2 refills | Status: DC

## 2017-10-02 MED ORDER — DIAZEPAM 5 MG PO TABS: 5.0000 mg | ORAL_TABLET | Freq: Every day | ORAL | 1 refills | Status: DC | PRN

## 2017-10-14 DIAGNOSIS — Z961 Presence of intraocular lens: Secondary | ICD-10-CM | POA: Diagnosis not present

## 2017-10-14 DIAGNOSIS — H40013 Open angle with borderline findings, low risk, bilateral: Secondary | ICD-10-CM | POA: Diagnosis not present

## 2017-10-14 DIAGNOSIS — H52203 Unspecified astigmatism, bilateral: Secondary | ICD-10-CM | POA: Diagnosis not present

## 2017-10-14 DIAGNOSIS — H40053 Ocular hypertension, bilateral: Secondary | ICD-10-CM | POA: Diagnosis not present

## 2017-11-25 ENCOUNTER — Encounter: Payer: Self-pay | Admitting: Family Medicine

## 2017-11-25 ENCOUNTER — Ambulatory Visit (INDEPENDENT_AMBULATORY_CARE_PROVIDER_SITE_OTHER): Payer: Medicare Other | Admitting: Family Medicine

## 2017-11-25 VITALS — BP 124/82 | HR 79 | Temp 98.4°F | Resp 16 | Ht 62.0 in | Wt 224.4 lb

## 2017-11-25 DIAGNOSIS — J45909 Unspecified asthma, uncomplicated: Secondary | ICD-10-CM

## 2017-11-25 DIAGNOSIS — I10 Essential (primary) hypertension: Secondary | ICD-10-CM | POA: Diagnosis not present

## 2017-11-25 DIAGNOSIS — M170 Bilateral primary osteoarthritis of knee: Secondary | ICD-10-CM

## 2017-11-25 DIAGNOSIS — E785 Hyperlipidemia, unspecified: Secondary | ICD-10-CM

## 2017-11-25 DIAGNOSIS — Z01818 Encounter for other preprocedural examination: Secondary | ICD-10-CM | POA: Diagnosis not present

## 2017-11-25 DIAGNOSIS — M25561 Pain in right knee: Secondary | ICD-10-CM | POA: Diagnosis not present

## 2017-11-25 HISTORY — DX: Hyperlipidemia, unspecified: E78.5

## 2017-11-25 LAB — COMPREHENSIVE METABOLIC PANEL
ALT: 18 U/L (ref 0–35)
AST: 17 U/L (ref 0–37)
Albumin: 4.3 g/dL (ref 3.5–5.2)
Alkaline Phosphatase: 94 U/L (ref 39–117)
BILIRUBIN TOTAL: 0.4 mg/dL (ref 0.2–1.2)
BUN: 17 mg/dL (ref 6–23)
CALCIUM: 9.1 mg/dL (ref 8.4–10.5)
CHLORIDE: 104 meq/L (ref 96–112)
CO2: 33 meq/L — AB (ref 19–32)
Creatinine, Ser: 0.67 mg/dL (ref 0.40–1.20)
GFR: 92.56 mL/min (ref >60.00)
Glucose, Bld: 84 mg/dL (ref 70–99)
Potassium: 4.1 mEq/L (ref 3.5–5.1)
Sodium: 142 mEq/L (ref 135–145)
Total Protein: 6.8 g/dL (ref 6.0–8.3)

## 2017-11-25 LAB — CBC
HEMATOCRIT: 40.3 % (ref 36.0–46.0)
HEMOGLOBIN: 13.8 g/dL (ref 12.0–15.0)
MCHC: 34.3 g/dL (ref 30.0–36.0)
MCV: 89.9 fl (ref 78.0–100.0)
PLATELETS: 252 10*3/uL (ref 150.0–400.0)
RBC: 4.48 Mil/uL (ref 3.87–5.11)
RDW: 13.1 % (ref 11.5–15.5)
WBC: 8.5 10*3/uL (ref 4.0–10.5)

## 2017-11-25 LAB — HEMOGLOBIN A1C: Hgb A1c MFr Bld: 5.5 % (ref 4.6–6.5)

## 2017-11-25 NOTE — Patient Instructions (Addendum)
A few things to remember from today's visit:   Preop examination - Plan: EKG 12-Lead, Comprehensive metabolic panel, Hemoglobin A1c, Urinalysis, Routine w reflex microscopic, CBC  Osteoarthritis of both knees, unspecified osteoarthritis type  Essential hypertension - Plan: Comprehensive metabolic panel Stop naproxen and avoid Aspirin or other similar medication at least 7 days prior to the surgery. You can continue your other medications. Keep your appointment with anesthesiologist. Complete dental treatment.  Due to the risk of blood clots I recommend oral anticoagulation after surgery.  Please be sure medication list is accurate. If a new problem present, please set up appointment sooner than planned today.

## 2017-11-25 NOTE — Progress Notes (Signed)
HPI:  Chief Complaint  Patient presents with  . Surgical Clearance    [name] is a 67 y.o. female, who is here today for pre op evaluation requested by Dr Neville Route.  Hx of knee OA,getting worse and interfering with her daily activities. Intra articular injections not longer helping, R>L  She takes Naproxen 220 mg bid.  Planning on having right TKR on 12/23/17.  She had dental exam yesterday,which was recommended before having surgery. She needs to have a root canal done before surgery and some dental extractions. Dental appt on 11/26/17.  She has had surgeries before , last one hysterectomy. She also had cholecystectomy and tonsillectomy. She denies complications during or right after procedures.  She thinks she may stay overnight.  Her sister is going to help her during recovery time.   Hx of asthma, she is on Advair 250-50 mcg bid and Albuterol inh prn. She denies dyspnea,wheezing,copugh,or chest pain. No Hx of tobacco use. She follows with Dr Annamaria Boots.  Anxiety on Cymbalta 60 mg and Valium 5 mg daily prn.  HTN: She is on Benicar 20 mg daily.  She denies any chest pain,palpitation,dizziness,or diaphoresis upon walking a hill or carrying heavy groceries. She can not climb stairs fast doe to knee pain.  HLD on non pharmacologic treatment.  No Hx of CAD,DM,or CKD.    Review of Systems  Constitutional: Negative for activity change, appetite change, fatigue and fever.  HENT: Negative for mouth sores, nosebleeds and trouble swallowing.   Eyes: Negative for redness and visual disturbance.  Respiratory: Negative for cough, shortness of breath and wheezing.   Cardiovascular: Negative for chest pain, palpitations and leg swelling.  Gastrointestinal: Negative for abdominal pain, nausea and vomiting.       Negative for changes in bowel habits.  Genitourinary: Negative for decreased urine volume, dysuria and hematuria.  Musculoskeletal: Positive for  arthralgias and gait problem. Negative for joint swelling.  Skin: Negative for rash.  Allergic/Immunologic: Positive for environmental allergies.  Neurological: Negative for syncope, weakness and headaches.      Current Outpatient Medications on File Prior to Visit  Medication Sig Dispense Refill  . albuterol (PROAIR HFA) 108 (90 Base) MCG/ACT inhaler Inhale 2 puffs into the lungs every 4 (four) hours as needed. 1 Inhaler 11  . CALCIUM-VITAMIN D PO Take 1 capsule by mouth 2 (two) times daily.      . cetirizine (ZYRTEC) 10 MG tablet Take 10 mg by mouth daily.    . diazepam (VALIUM) 5 MG tablet Take 1 tablet (5 mg total) by mouth daily as needed for anxiety. 30 tablet 1  . DULoxetine (CYMBALTA) 60 MG capsule Take 1 capsule (60 mg total) by mouth daily. 90 capsule 3  . Fluticasone-Salmeterol (ADVAIR DISKUS) 250-50 MCG/DOSE AEPB INHALE 1 PUFF INTO LUNGS TWICE A DAY RINSE AFTER USE 3 each 3  . latanoprost (XALATAN) 0.005 % ophthalmic solution Place 1 drop into both eyes at bedtime.  11  . montelukast (SINGULAIR) 10 MG tablet TAKE 1 TABLET BY MOUTH EVERY DAY 90 tablet 2  . naproxen sodium (ANAPROX) 220 MG tablet Take 220 mg by mouth 2 (two) times daily with a meal.    . olmesartan (BENICAR) 20 MG tablet Take 1 tablet (20 mg total) by mouth daily. 90 tablet 2  . vitamin B-12 (CYANOCOBALAMIN) 1000 MCG tablet Take 1,000 mcg by mouth daily.      . Vitamin D, Ergocalciferol, (DRISDOL) 50000 units CAPS capsule TAKE ONE CAPSULE  BY MOUTH EVERY 5 DAYS 18 capsule 2  . azelastine (ASTELIN) 0.1 % nasal spray Place into the nose.     No current facility-administered medications on file prior to visit.      Past Medical History:  Diagnosis Date  . Allergic rhinitis   . Asthma   . Headache(784.0)   . Hypertension    No Known Allergies  Past Surgical History:  Procedure Laterality Date  . CHOLECYSTECTOMY    . TONSILLECTOMY    . TOTAL ABDOMINAL HYSTERECTOMY      Social History   Socioeconomic  History  . Marital status: Married    Spouse name: Teresa Murphy  . Number of children: 0  . Years of education: College  . Highest education level: Not on file  Occupational History  . Occupation: retired  Scientific laboratory technician  . Financial resource strain: Not on file  . Food insecurity:    Worry: Not on file    Inability: Not on file  . Transportation needs:    Medical: Not on file    Non-medical: Not on file  Tobacco Use  . Smoking status: Never Smoker  . Smokeless tobacco: Never Used  Substance and Sexual Activity  . Alcohol use: No    Alcohol/week: 0.0 standard drinks  . Drug use: No  . Sexual activity: Yes    Birth control/protection: Surgical  Lifestyle  . Physical activity:    Days per week: Not on file    Minutes per session: Not on file  . Stress: Not on file  Relationships  . Social connections:    Talks on phone: Not on file    Gets together: Not on file    Attends religious service: Not on file    Active member of club or organization: Not on file    Attends meetings of clubs or organizations: Not on file    Relationship status: Not on file  Other Topics Concern  . Not on file  Social History Narrative   Patient lives at home with spouse.   Caffeine Use:     Vitals:   11/25/17 1351  BP: 124/82  Pulse: 79  Resp: 16  Temp: 98.4 F (36.9 C)  SpO2: 97%   Body mass index is 41.04 kg/m.   Physical Exam  Nursing note and vitals reviewed. Constitutional: She is oriented to person, place, and time. She appears well-developed. No distress.  HENT:  Head: Normocephalic and atraumatic.  Mouth/Throat: Oropharynx is clear and moist and mucous membranes are normal.  Eyes: Pupils are equal, round, and reactive to light. Conjunctivae are normal.  Cardiovascular: Normal rate and regular rhythm.  No murmur heard. Pulses:      Dorsalis pedis pulses are 2+ on the right side, and 2+ on the left side.  Respiratory: Effort normal and breath sounds normal. No respiratory  distress.  GI: Soft. She exhibits no mass. There is no hepatomegaly. There is no tenderness.  Musculoskeletal: She exhibits no edema.  Right knee limitation of flexion, crepitus, pain elicited with movement.  Lymphadenopathy:    She has no cervical adenopathy.  Neurological: She is alert and oriented to person, place, and time. She has normal strength. No cranial nerve deficit. Gait abnormal.  Antalgic gait with no assistance.  Skin: Skin is warm. No rash noted. No erythema.  Psychiatric: She has a normal mood and affect.  Well groomed, good eye contact.    ASSESSMENT AND PLAN:   Ms. Tineka was seen today for surgical clearance.  Orders Placed This Encounter  Procedures  . Comprehensive metabolic panel  . Hemoglobin A1c  . Urinalysis, Routine w reflex microscopic  . CBC  . EKG 12-Lead   Lab Results  Component Value Date   CREATININE 0.67 11/25/2017   BUN 17 11/25/2017   NA 142 11/25/2017   K 4.1 11/25/2017   CL 104 11/25/2017   CO2 33 (H) 11/25/2017   Lab Results  Component Value Date   ALT 18 11/25/2017   AST 17 11/25/2017   ALKPHOS 94 11/25/2017   BILITOT 0.4 11/25/2017   Lab Results  Component Value Date   WBC 8.5 11/25/2017   HGB 13.8 11/25/2017   HCT 40.3 11/25/2017   MCV 89.9 11/25/2017   PLT 252.0 11/25/2017   Lab Results  Component Value Date   HGBA1C 5.5 11/25/2017     Preop examination  Based on chronic problems she has low risk for complications. She is hoping that surgery improves her quality of life,so wishes to go ahead with TKR.  Chronic problem well controlled. Instructed to stop NSAID > 7 days before surgery. She can continue rest of her meds. She is going to meet with anesthesiologist a few days before procedure. Dr Neville Route has requested some lab work before surgery,labs and EKG ordered. EKG today: SR, normal axis and intervals.No other EKG available for comparison.  DVT prophylaxis: Early ambulation and oral anticoagulation  recommended.  I am not anticipating any major lab abnormality,so she is cleared to proceed with surgery. Copy of note will be faxed to Dr Neville Route once lab results are back.  -     EKG 12-Lead -     Comprehensive metabolic panel -     Hemoglobin A1c -     Urinalysis, Routine w reflex microscopic -     CBC  Osteoarthritis of both knees, unspecified osteoarthritis type  Getting worse, R>L. Continue following with ortho. After right TKR she is planning on having left one done.  Essential hypertension  Adequately controlled. No changes in current management.  -     Comprehensive metabolic panel  Asthma with bronchitis  Well controlled. No changes in current management.  Hyperlipidemia, unspecified hyperlipidemia type  Not on statin. Continue non pharmacologic treatment.    Return for Keep next appt.      Jasmine Maceachern G. Martinique, MD  [facility]. [place] office.

## 2017-11-25 NOTE — Assessment & Plan Note (Addendum)
Right knee pain getting worse, limiting daily activities. She feels like she will greatly benefit from right total knee replacement. Requested preop labs were ordered. We will fax copy of visit today as well as lab results.

## 2017-11-26 ENCOUNTER — Encounter (HOSPITAL_COMMUNITY): Payer: Self-pay | Admitting: *Deleted

## 2017-11-26 LAB — URINALYSIS, ROUTINE W REFLEX MICROSCOPIC
BILIRUBIN URINE: NEGATIVE
Hgb urine dipstick: NEGATIVE
Ketones, ur: NEGATIVE
LEUKOCYTES UA: NEGATIVE
Nitrite: NEGATIVE
PH: 6 (ref 5.0–8.0)
RBC / HPF: NONE SEEN (ref 0–?)
SPECIFIC GRAVITY, URINE: 1.02 (ref 1.000–1.030)
TOTAL PROTEIN, URINE-UPE24: NEGATIVE
Urine Glucose: NEGATIVE
Urobilinogen, UA: 0.2 (ref 0.0–1.0)

## 2017-11-27 ENCOUNTER — Encounter: Payer: Self-pay | Admitting: Family Medicine

## 2017-12-15 DIAGNOSIS — M1711 Unilateral primary osteoarthritis, right knee: Secondary | ICD-10-CM | POA: Diagnosis not present

## 2017-12-15 NOTE — Progress Notes (Signed)
11-28-17 Surgical clearance from Dr. Junie Spencer on chart  11-25-17 (Epic) EKG, CMP, CBC, HGA1C

## 2017-12-15 NOTE — Patient Instructions (Signed)
Teresa Murphy  12/15/2017   Your procedure is scheduled on: 12-23-17     Report to [facility] Main  Entrance    Report to Admitting at 5:30 AM    Call this number if you have problems the morning of surgery [phone]    Remember: Do not eat food or drink liquids :After Midnight.     BRUSH YOUR TEETH MORNING OF SURGERY AND RINSE YOUR MOUTH OUT, NO CHEWING GUM CANDY OR MINTS.   Take these medicines the morning of surgery with A SIP OF WATER: Duloxetine (Cymbalta). You may bring and use your nasal spray as needed.                                You may not have any metal on your body including hair pins and              piercings  Do not wear jewelry, make-up, lotions, powders or perfumes, deodorant             Do not wear nail polish.  Do not shave  48 hours prior to surgery.                Do not bring valuables to the hospital. [place].  Contacts, dentures or bridgework may not be worn into surgery.  Leave suitcase in the car. After surgery it may be brought to your room.     Special Instructions: N/A              Please read over the following fact sheets you were given: _____________________________________________________________________             [facility] - Preparing for Surgery Before surgery, you can play an important role.  Because skin is not sterile, your skin needs to be as free of germs as possible.  You can reduce the number of germs on your skin by washing with CHG (chlorahexidine gluconate) soap before surgery.  CHG is an antiseptic cleaner which kills germs and bonds with the skin to continue killing germs even after washing. Please DO NOT use if you have an allergy to CHG or antibacterial soaps.  If your skin becomes reddened/irritated stop using the CHG and inform your nurse when you arrive at Short Stay. Do not shave (including legs and underarms) for at least 48 hours  prior to the first CHG shower.  You may shave your face/neck. Please follow these instructions carefully:  1.  Shower with CHG Soap the night before surgery and the  morning of Surgery.  2.  If you choose to wash your hair, wash your hair first as usual with your  normal  shampoo.  3.  After you shampoo, rinse your hair and body thoroughly to remove the  shampoo.                           4.  Use CHG as you would any other liquid soap.  You can apply chg directly  to the skin and wash                       Gently with a scrungie or clean  washcloth.  5.  Apply the CHG Soap to your body ONLY FROM THE NECK DOWN.   Do not use on face/ open                           Wound or open sores. Avoid contact with eyes, ears mouth and genitals (private parts).                       Wash face,  Genitals (private parts) with your normal soap.             6.  Wash thoroughly, paying special attention to the area where your surgery  will be performed.  7.  Thoroughly rinse your body with warm water from the neck down.  8.  DO NOT shower/wash with your normal soap after using and rinsing off  the CHG Soap.                9.  Pat yourself dry with a clean towel.            10.  Wear clean pajamas.            11.  Place clean sheets on your bed the night of your first shower and do not  sleep with pets. Day of Surgery : Do not apply any lotions/deodorants the morning of surgery.  Please wear clean clothes to the hospital/surgery center.  FAILURE TO FOLLOW THESE INSTRUCTIONS MAY RESULT IN THE CANCELLATION OF YOUR SURGERY PATIENT SIGNATURE_________________________________  NURSE SIGNATURE__________________________________  ________________________________________________________________________   Adam Phenix  An incentive spirometer is a tool that can help keep your lungs clear and active. This tool measures how well you are filling your lungs with each breath. Taking long deep breaths may help reverse  or decrease the chance of developing breathing (pulmonary) problems (especially infection) following:  A long period of time when you are unable to move or be active. BEFORE THE PROCEDURE   If the spirometer includes an indicator to show your best effort, your nurse or respiratory therapist will set it to a desired goal.  If possible, sit up straight or lean slightly forward. Try not to slouch.  Hold the incentive spirometer in an upright position. INSTRUCTIONS FOR USE  1. Sit on the edge of your bed if possible, or sit up as far as you can in bed or on a chair. 2. Hold the incentive spirometer in an upright position. 3. Breathe out normally. 4. Place the mouthpiece in your mouth and seal your lips tightly around it. 5. Breathe in slowly and as deeply as possible, raising the piston or the ball toward the top of the column. 6. Hold your breath for 3-5 seconds or for as long as possible. Allow the piston or ball to fall to the bottom of the column. 7. Remove the mouthpiece from your mouth and breathe out normally. 8. Rest for a few seconds and repeat Steps 1 through 7 at least 10 times every 1-2 hours when you are awake. Take your time and take a few normal breaths between deep breaths. 9. The spirometer may include an indicator to show your best effort. Use the indicator as a goal to work toward during each repetition. 10. After each set of 10 deep breaths, practice coughing to be sure your lungs are clear. If you have an incision (the cut made at the time of surgery), support your incision when coughing by  placing a pillow or rolled up towels firmly against it. Once you are able to get out of bed, walk around indoors and cough well. You may stop using the incentive spirometer when instructed by your caregiver.  RISKS AND COMPLICATIONS  Take your time so you do not get dizzy or light-headed.  If you are in pain, you may need to take or ask for pain medication before doing incentive  spirometry. It is harder to take a deep breath if you are having pain. AFTER USE  Rest and breathe slowly and easily.  It can be helpful to keep track of a log of your progress. Your caregiver can provide you with a simple table to help with this. If you are using the spirometer at home, follow these instructions: [place] IF:   You are having difficultly using the spirometer.  You have trouble using the spirometer as often as instructed.  Your pain medication is not giving enough relief while using the spirometer.  You develop fever of 100.5 F (38.1 C) or higher. SEEK IMMEDIATE MEDICAL CARE IF:   You cough up bloody sputum that had not been present before.  You develop fever of 102 F (38.9 C) or greater.  You develop worsening pain at or near the incision site. MAKE SURE YOU:   Understand these instructions.  Will watch your condition.  Will get help right away if you are not doing well or get worse. Document Released: 06/03/2006 Document Revised: 04/15/2011 Document Reviewed: 08/04/2006 ExitCare Patient Information 2014 Exit[employer], [place].   ________________________________________________________________________  WHAT IS A BLOOD TRANSFUSION? Blood Transfusion Information  A transfusion is the replacement of blood or some of its parts. Blood is made up of multiple cells which provide different functions.  Red blood cells carry oxygen and are used for blood loss replacement.  White blood cells fight against infection.  Platelets control bleeding.  Plasma helps clot blood.  Other blood products are available for specialized needs, such as hemophilia or other clotting disorders. BEFORE THE TRANSFUSION  Who gives blood for transfusions?   Healthy volunteers who are fully evaluated to make sure their blood is safe. This is blood bank blood. Transfusion therapy is the safest it has ever been in the practice of medicine. Before blood is taken from a donor, a  complete history is taken to make sure that person has no history of diseases nor engages in risky social behavior (examples are intravenous drug use or sexual activity with multiple partners). The donor's travel history is screened to minimize risk of transmitting infections, such as malaria. The donated blood is tested for signs of infectious diseases, such as HIV and hepatitis. The blood is then tested to be sure it is compatible with you in order to minimize the chance of a transfusion reaction. If you or a relative donates blood, this is often done in anticipation of surgery and is not appropriate for emergency situations. It takes many days to process the donated blood. RISKS AND COMPLICATIONS Although transfusion therapy is very safe and saves many lives, the main dangers of transfusion include:   Getting an infectious disease.  Developing a transfusion reaction. This is an allergic reaction to something in the blood you were given. Every precaution is taken to prevent this. The decision to have a blood transfusion has been considered carefully by your caregiver before blood is given. Blood is not given unless the benefits outweigh the risks. AFTER THE TRANSFUSION  Right after receiving a blood  transfusion, you will usually feel much better and more energetic. This is especially true if your red blood cells have gotten low (anemic). The transfusion raises the level of the red blood cells which carry oxygen, and this usually causes an energy increase.  The nurse administering the transfusion will monitor you carefully for complications. HOME CARE INSTRUCTIONS  No special instructions are needed after a transfusion. You may find your energy is better. Speak with your caregiver about any limitations on activity for underlying diseases you may have. SEEK MEDICAL CARE IF:   Your condition is not improving after your transfusion.  You develop redness or irritation at the intravenous (IV)  site. SEEK IMMEDIATE MEDICAL CARE IF:  Any of the following symptoms occur over the next 12 hours:  Shaking chills.  You have a temperature by mouth above 102 F (38.9 C), not controlled by medicine.  Chest, back, or muscle pain.  People around you feel you are not acting correctly or are confused.  Shortness of breath or difficulty breathing.  Dizziness and fainting.  You get a rash or develop hives.  You have a decrease in urine output.  Your urine turns a dark color or changes to pink, red, or brown. Any of the following symptoms occur over the next 10 days:  You have a temperature by mouth above 102 F (38.9 C), not controlled by medicine.  Shortness of breath.  Weakness after normal activity.  The white part of the eye turns yellow (jaundice).  You have a decrease in the amount of urine or are urinating less often.  Your urine turns a dark color or changes to pink, red, or brown. Document Released: 01/19/2000 Document Revised: 04/15/2011 Document Reviewed: 09/07/2007 [facility] Patient Information 2014 [place], [place].  _______________________________________________________________________

## 2017-12-16 ENCOUNTER — Encounter (HOSPITAL_COMMUNITY)
Admission: RE | Admit: 2017-12-16 | Discharge: 2017-12-16 | Disposition: A | Discharge status: Home / Self Care | Payer: Medicare Other | Source: Ambulatory Visit | Attending: Family Medicine | Admitting: Family Medicine

## 2017-12-23 ENCOUNTER — Encounter (HOSPITAL_COMMUNITY): Admission: RE | Payer: Self-pay | Source: Other Acute Inpatient Hospital

## 2017-12-23 ENCOUNTER — Inpatient Hospital Stay (HOSPITAL_COMMUNITY)
Admission: RE | Admit: 2017-12-23 | Payer: Medicare Other | Source: Other Acute Inpatient Hospital | Admitting: Orthopedic Surgery

## 2017-12-23 SURGERY — ARTHROPLASTY, KNEE, TOTAL
Anesthesia: Spinal | Site: Knee | Laterality: Right

## 2017-12-25 ENCOUNTER — Other Ambulatory Visit: Payer: Self-pay

## 2018-02-03 ENCOUNTER — Other Ambulatory Visit: Payer: Self-pay | Admitting: Internal Medicine

## 2018-02-03 MED ORDER — MONTELUKAST SODIUM 10 MG PO TABS: 10.0000 mg | ORAL_TABLET | Freq: Every day | ORAL | 0 refills | Status: DC

## 2018-02-07 DIAGNOSIS — Z23 Encounter for immunization: Secondary | ICD-10-CM | POA: Diagnosis not present

## 2018-03-25 IMAGING — DX DG CHEST 2V
2 series · 2 of 2 positions shown · non-contrast
Comparison: 03/08/2016.

CLINICAL DATA: Cough.  Wheezing .

EXAM:
CHEST  2 VIEW

[chest pa]
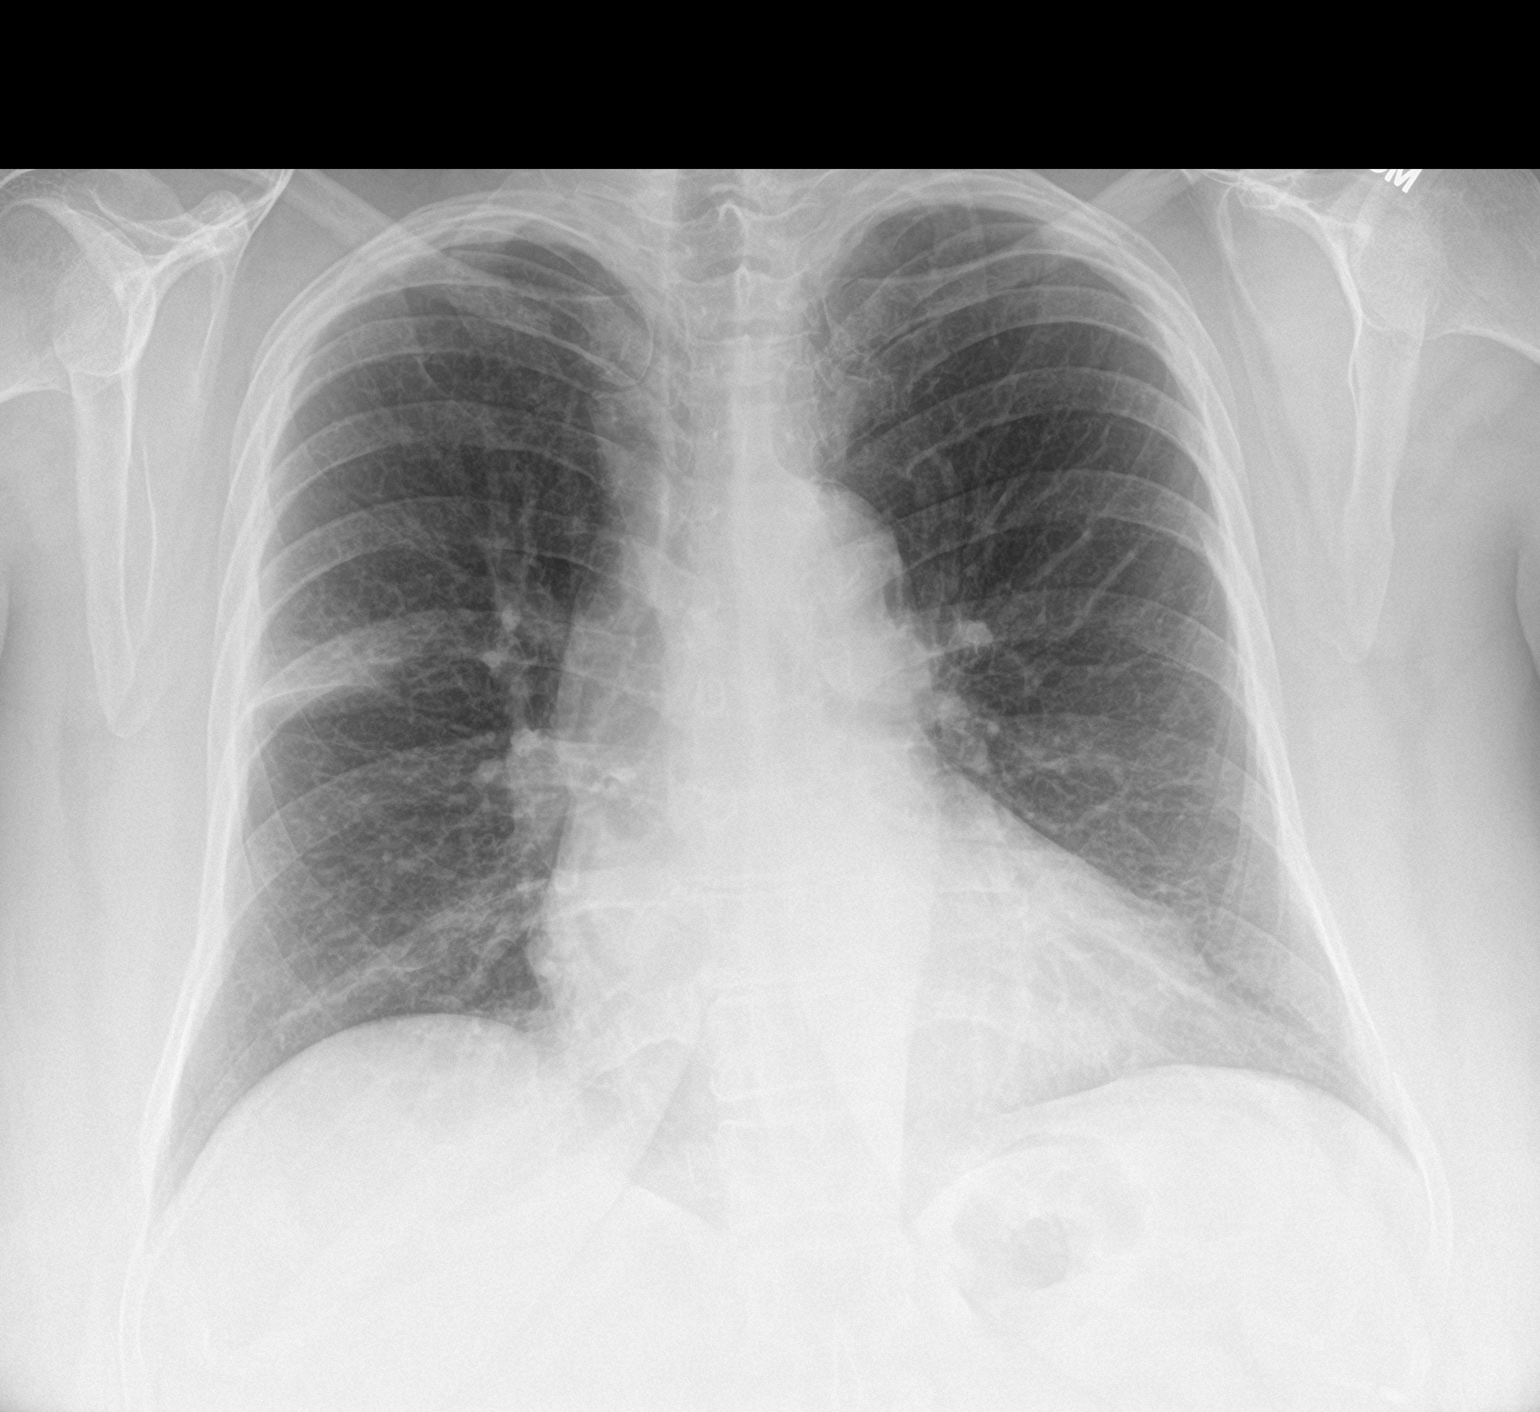

[chest lat]
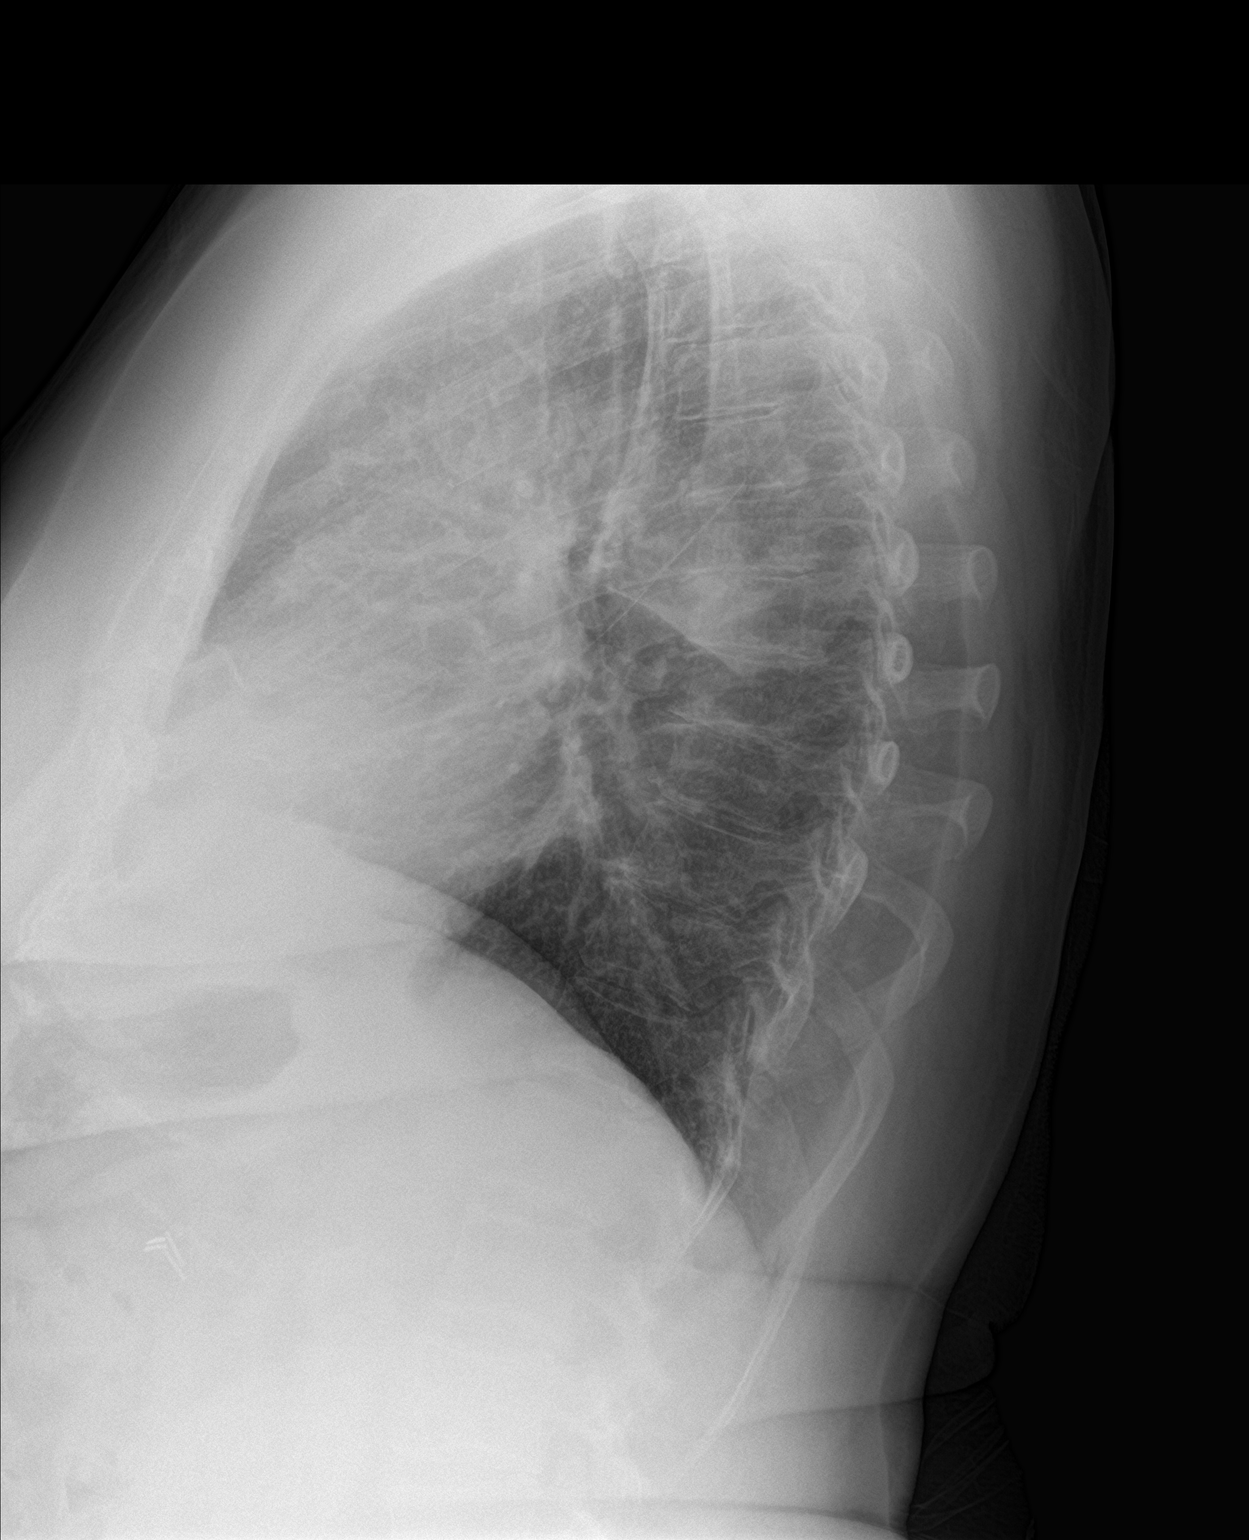

[2 of 2 positions shown; findings below may reference images not displayed]

FINDINGS: Right mid lung field infiltrate noted consistent pneumonia. No
pleural effusion or pneumothorax. Heart size stable with borderline
cardiomegaly. No pulmonary venous congestion. No acute bony
abnormality .
IMPRESSION: Right mid lung field infiltrate consistent pneumonia. Follow-up
chest x-rays to demonstrate clearing suggested.

## 2018-04-30 IMAGING — DX DG CHEST 2V
2 series · 2 of 2 positions shown · non-contrast
Comparison: 03/20/2016

CLINICAL DATA: Pneumonia

EXAM:
CHEST  2 VIEW

[chest pa]
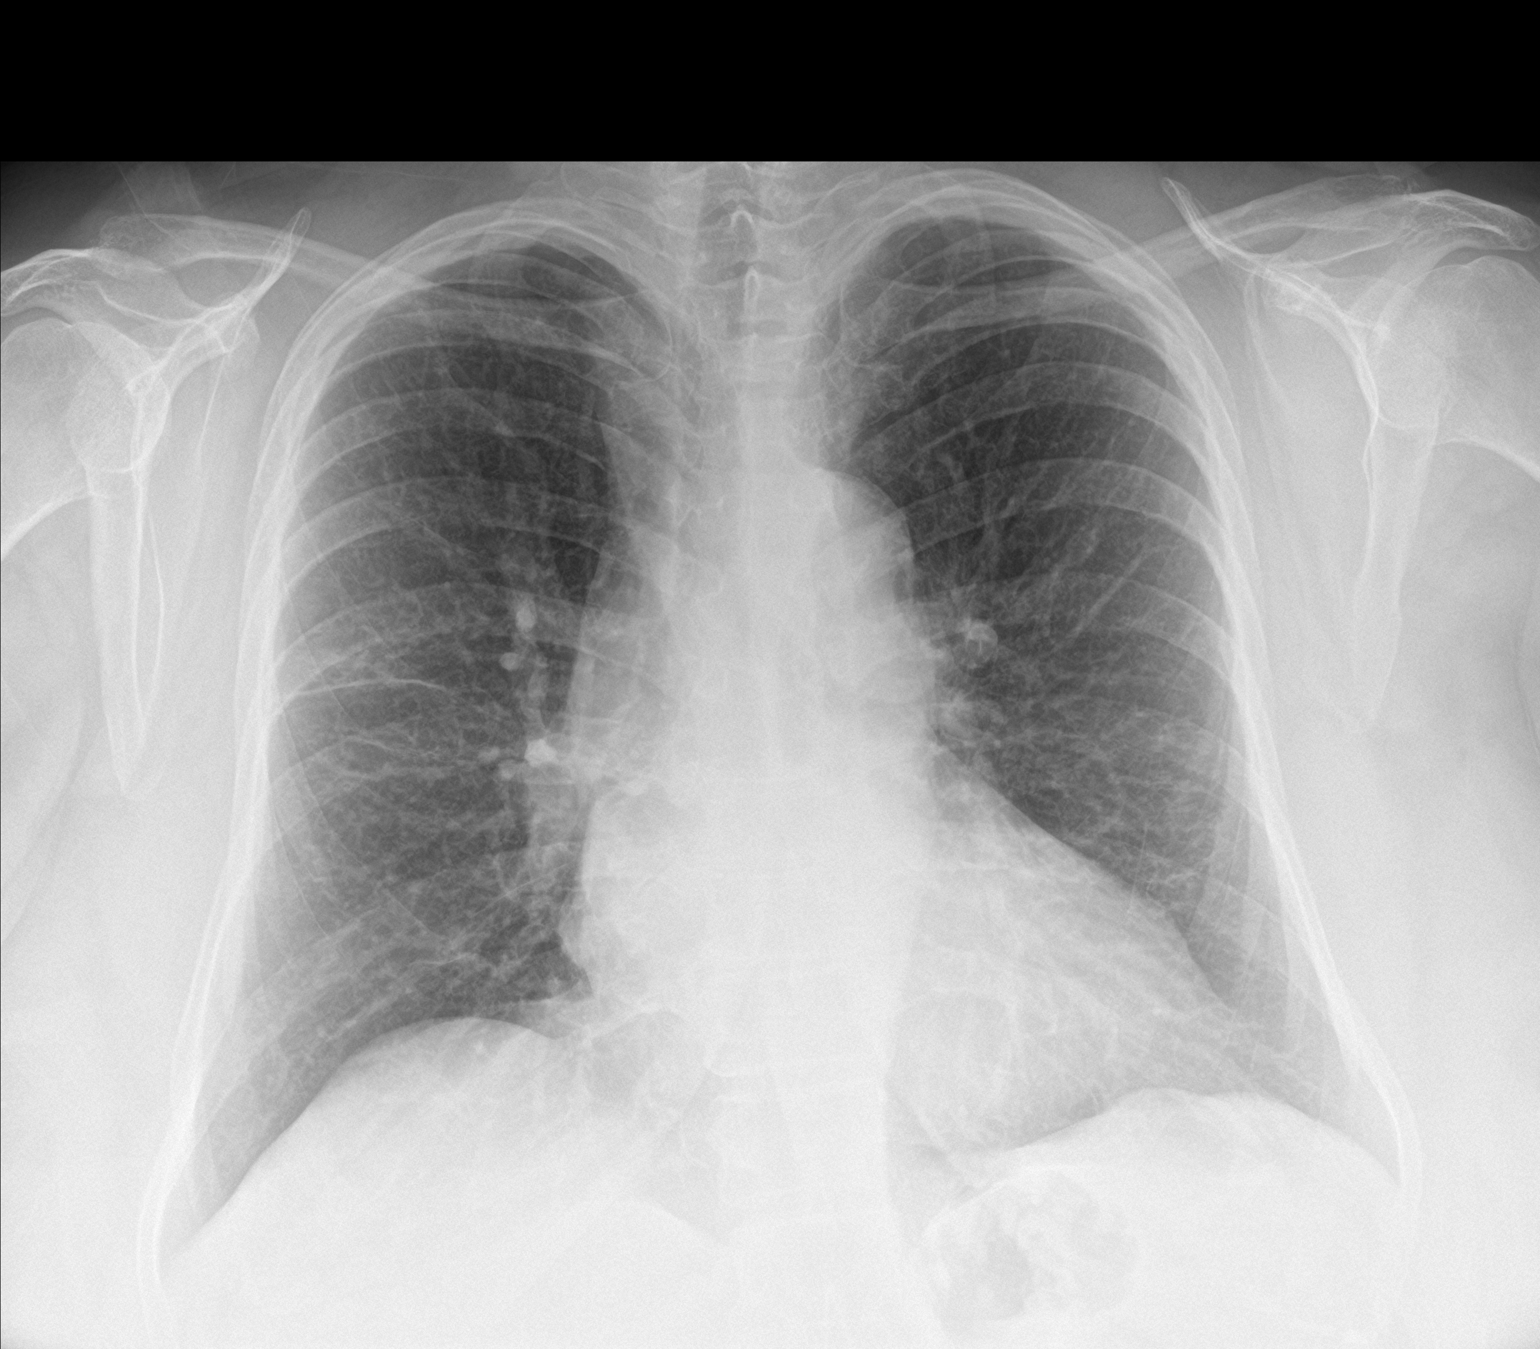

[chest lat]
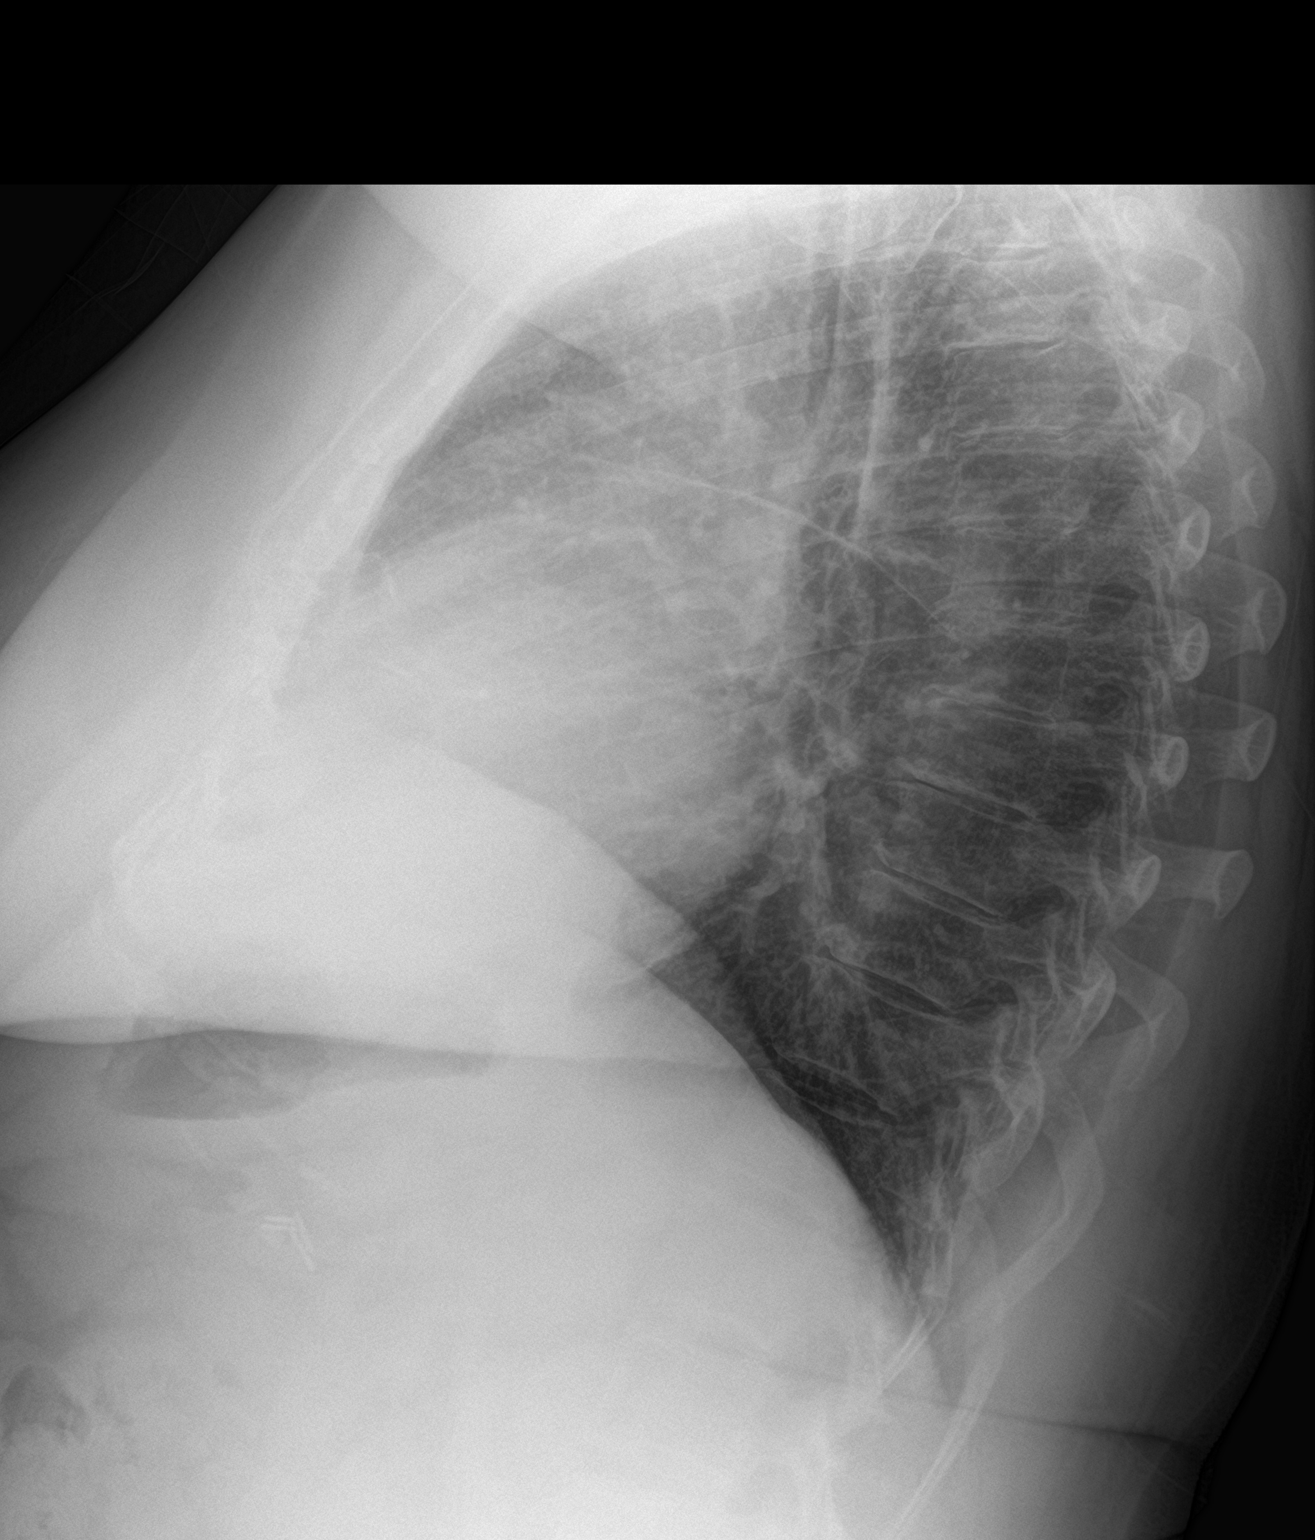

[2 of 2 positions shown; findings below may reference images not displayed]

FINDINGS: Cardiac shadow is within normal limits. Previously seen right upper
lobe infiltrate has nearly completely resolved when compared with
the prior exam. No new focal infiltrate is seen. No sizable effusion
is noted. No bony abnormality is seen.
IMPRESSION: Near complete resolution of right upper lobe infiltrate when
compared with the prior exam.

## 2018-05-01 ENCOUNTER — Other Ambulatory Visit: Payer: Self-pay | Admitting: Internal Medicine

## 2018-05-04 ENCOUNTER — Ambulatory Visit: Payer: Medicare Other | Admitting: Internal Medicine

## 2018-05-08 ENCOUNTER — Telehealth: Payer: Self-pay

## 2018-05-08 NOTE — Telephone Encounter (Signed)
Author phoned pt. to assess interest in scheduling virtual awv. No answer, author left detailed VM asking for return call.   

## 2018-05-18 ENCOUNTER — Ambulatory Visit (INDEPENDENT_AMBULATORY_CARE_PROVIDER_SITE_OTHER): Payer: Medicare Other | Admitting: Family Medicine

## 2018-05-18 ENCOUNTER — Other Ambulatory Visit: Payer: Self-pay

## 2018-05-18 ENCOUNTER — Encounter: Payer: Self-pay | Admitting: Family Medicine

## 2018-05-18 VITALS — BP 140/75 | HR 72 | Resp 12

## 2018-05-18 DIAGNOSIS — M170 Bilateral primary osteoarthritis of knee: Secondary | ICD-10-CM

## 2018-05-18 DIAGNOSIS — J45909 Unspecified asthma, uncomplicated: Secondary | ICD-10-CM

## 2018-05-18 DIAGNOSIS — F411 Generalized anxiety disorder: Secondary | ICD-10-CM

## 2018-05-18 DIAGNOSIS — I10 Essential (primary) hypertension: Secondary | ICD-10-CM | POA: Diagnosis not present

## 2018-05-18 DIAGNOSIS — E559 Vitamin D deficiency, unspecified: Secondary | ICD-10-CM

## 2018-05-18 MED ORDER — VITAMIN D (ERGOCALCIFEROL) 1.25 MG (50000 UNIT) PO CAPS: ORAL_CAPSULE | ORAL | 2 refills | Status: DC

## 2018-05-18 MED ORDER — DULOXETINE HCL 60 MG PO CPEP: 60.0000 mg | ORAL_CAPSULE | Freq: Every day | ORAL | 2 refills | Status: DC

## 2018-05-18 MED ORDER — FLUTICASONE-SALMETEROL 250-50 MCG/DOSE IN AEPB: INHALATION_SPRAY | RESPIRATORY_TRACT | 0 refills | Status: AC

## 2018-05-18 MED ORDER — DIAZEPAM 5 MG PO TABS: 5.0000 mg | ORAL_TABLET | Freq: Every day | ORAL | 1 refills | Status: DC | PRN

## 2018-05-18 MED ORDER — ALBUTEROL SULFATE HFA 108 (90 BASE) MCG/ACT IN AERS: 2.0000 | INHALATION_SPRAY | RESPIRATORY_TRACT | 3 refills | Status: DC | PRN

## 2018-05-18 MED ORDER — MONTELUKAST SODIUM 10 MG PO TABS: ORAL_TABLET | ORAL | 0 refills | Status: DC

## 2018-05-18 MED ORDER — OLMESARTAN MEDOXOMIL 20 MG PO TABS: 20.0000 mg | ORAL_TABLET | Freq: Every day | ORAL | 2 refills | Status: DC

## 2018-05-18 NOTE — Assessment & Plan Note (Signed)
Problem is well controlled. No changes in current management. Prescriptions sent to her pharmacy as requested. Continue following with pulmonologist.

## 2018-05-18 NOTE — Assessment & Plan Note (Signed)
We discussed some side effects of chronic NSAID use, recommend decreasing dose of naproxen from 2 tablets twice daily to 1 tablet twice daily. Tylenol 500 mg 4 times per day. Low impact exercise.

## 2018-05-18 NOTE — Assessment & Plan Note (Signed)
Last 25 OH vitamin D in normal range. No changes in ergocalciferol dose. We will plan on having lab work next visit.

## 2018-05-18 NOTE — Assessment & Plan Note (Signed)
She has already lost some weight. We discussed benefits of wt loss as well as adverse effects of obesity. Consistency with healthy diet and low impact physical activity recommended.

## 2018-05-18 NOTE — Assessment & Plan Note (Signed)
Problem is well controlled. No changes in Cymbalta or diazepam. Some side effects of medication discussed. Follow-up in 6 months, before if needed.

## 2018-05-18 NOTE — Progress Notes (Signed)
Virtual Visit via Video Note   I connected with Teresa Murphy on 05/18/18 at 10:15 AM EDT by a video enabled telemedicine application and verified that I am speaking with the correct person using two identifiers.  Location patient: home Location provider:work or home office Persons participating in the virtual visit: patient, provider  I discussed the limitations of evaluation and management by telemedicine and the availability of in person appointments. The patient expressed understanding and agreed to proceed.   HPI: 68 yo female seeing today for chronic disease management. She also needs refills on her asthma meds. She follows with pulmonologist,states that f/u appt was changed.  She is on Singulair 10 mg daily, Advair 250-50 mcg twice daily, and albuterol inhaler as needed. She has not used albuterol in a while. Negative for cough, wheezing, dyspnea.  Hypertension:  Currently on Benicar 20 mg daily. She taking medications as instructed, no side effects reported. He has not noted unusual headache, visual changes, exertional chest pain, dyspnea,  focal weakness, or edema. BP readings at home are "good."  Lab Results  Component Value Date   CREATININE 0.67 11/25/2017   BUN 17 11/25/2017   NA 142 11/25/2017   K 4.1 11/25/2017   CL 104 11/25/2017   CO2 33 (H) 11/25/2017   Knee pain, bilateral. It is worse after sitting for a few min or any activity that involves bending knees. Prolonged walking and standing.  Burning like pain,severe. Knee surgery was postpone, she was told she needs to loose about 40-45 Lb before total knee replacement. She has tried to eat healthier. She has not been able to exercise due to pain.  She was planning on doing aquatic exercises but due to current public health concerns about coronavirus, she could not do so. She has already lost about 13 pounds.  Cymbalta 60 mg,she is not sure if it is helping with knee pain.  Medication is helping with  anxiety. She is also on diazepam 5 mg daily as needed. Tolerating medication well.  Vitamin D deficiency, currently she is on ergocalciferol 50,000 units every 5 days. Last 25 OH vitamin D was in 09/2017, normal at 40.6.   ROS: See pertinent positives and negatives per HPI.  Past Medical History:  Diagnosis Date  . Allergic rhinitis   . Asthma   . Headache(784.0)   . Hypertension     Past Surgical History:  Procedure Laterality Date  . CHOLECYSTECTOMY    . TONSILLECTOMY    . TOTAL ABDOMINAL HYSTERECTOMY      Family History  Problem Relation Age of Onset  . Dementia Mother   . Multiple myeloma Mother   . Lung cancer Father   . Asthma Sister   . Sleep apnea Sister   . Heart disease Unknown   . Allergic rhinitis Unknown   . Cancer Unknown     Social History   Socioeconomic History  . Marital status: Married    Spouse name: [name]  . Number of children: 0  . Years of education: College  . Highest education level: Not on file  Occupational History  . Occupation: retired  Scientific laboratory technician  . Financial resource strain: Not on file  . Food insecurity:    Worry: Not on file    Inability: Not on file  . Transportation needs:    Medical: Not on file    Non-medical: Not on file  Tobacco Use  . Smoking status: Never Smoker  . Smokeless tobacco: Never Used  Substance and Sexual  Activity  . Alcohol use: No    Alcohol/week: 0.0 standard drinks  . Drug use: No  . Sexual activity: Yes    Birth control/protection: Surgical  Lifestyle  . Physical activity:    Days per week: Not on file    Minutes per session: Not on file  . Stress: Not on file  Relationships  . Social connections:    Talks on phone: Not on file    Gets together: Not on file    Attends religious service: Not on file    Active member of club or organization: Not on file    Attends meetings of clubs or organizations: Not on file    Relationship status: Not on file  . Intimate partner violence:     Fear of current or ex partner: Not on file    Emotionally abused: Not on file    Physically abused: Not on file    Forced sexual activity: Not on file  Other Topics Concern  . Not on file  Social History Narrative   Patient lives at home with spouse.   Caffeine Use:       Current Outpatient Medications:  .  albuterol (PROAIR HFA) 108 (90 Base) MCG/ACT inhaler, Inhale 2 puffs into the lungs every 4 (four) hours as needed., Disp: 1 Inhaler, Rfl: 3 .  azelastine (ASTELIN) 0.1 % nasal spray, Place 1 spray into the nose 2 (two) times daily as needed for rhinitis or allergies. , Disp: , Rfl:  .  cetirizine (ZYRTEC) 10 MG tablet, Take 10 mg by mouth at bedtime. , Disp: , Rfl:  .  diazepam (VALIUM) 5 MG tablet, Take 1 tablet (5 mg total) by mouth daily as needed for anxiety., Disp: 30 tablet, Rfl: 1 .  DULoxetine (CYMBALTA) 60 MG capsule, Take 1 capsule (60 mg total) by mouth daily., Disp: 90 capsule, Rfl: 2 .  Fluticasone-Salmeterol (ADVAIR DISKUS) 250-50 MCG/DOSE AEPB, INHALE 1 PUFF INTO LUNGS TWICE A DAY RINSE AFTER USE, Disp: 3 each, Rfl: 0 .  latanoprost (XALATAN) 0.005 % ophthalmic solution, Place 1 drop into both eyes at bedtime., Disp: , Rfl: 11 .  Magnesium 100 MG CAPS, Take 100 mg by mouth daily., Disp: , Rfl:  .  montelukast (SINGULAIR) 10 MG tablet, TAKE 1 TABLET BY MOUTH EVERYDAY AT BEDTIME, Disp: 90 tablet, Rfl: 0 .  Multiple Vitamins-Minerals (MULTIVITAMIN WOMEN 50+) TABS, Take 1 tablet by mouth daily., Disp: , Rfl:  .  naproxen sodium (ANAPROX) 220 MG tablet, Take 220 mg by mouth 2 (two) times daily with a meal., Disp: , Rfl:  .  olmesartan (BENICAR) 20 MG tablet, Take 1 tablet (20 mg total) by mouth daily., Disp: 90 tablet, Rfl: 2 .  Vitamin D, Ergocalciferol, (DRISDOL) 1.25 MG (50000 UT) CAPS capsule, TAKE ONE CAPSULE BY MOUTH EVERY 5 DAYS, Disp: 18 capsule, Rfl: 2  EXAM:  VITALS per patient if applicable:BP 536/14   Pulse 72   Resp 12   GENERAL: alert, oriented, appears  well and in no acute distress  HEENT: atraumatic, conjunttiva clear, no obvious abnormalities on inspection of face.  NECK: Otherwise normal movements of the head and neck  LUNGS: on inspection no signs of respiratory distress, breathing rate appears normal, no obvious gross SOB, gasping or wheezing  CV: no obvious cyanosis  Teresa: moves all visible extremities without noticeable abnormality  PSYCH/NEURO: pleasant and cooperative, no obvious depression or anxiety, speech and thought processing grossly intact  ASSESSMENT AND PLAN:  Discussed the following  assessment and plan:  Asthma with bronchitis Problem is well controlled. No changes in current management. Prescriptions sent to her pharmacy as requested. Continue following with pulmonologist.  Osteoarthritis of both knees We discussed some side effects of chronic NSAID use, recommend decreasing dose of naproxen from 2 tablets twice daily to 1 tablet twice daily. Tylenol 500 mg 4 times per day. Low impact exercise.  Vitamin D deficiency Last 2 OH vitamin D in normal range. No changes in ergocalciferol dose. We will plan on having lab work next visit.  Severe obesity (BMI 35.0-39.9) with comorbidity ([place]) She has already lost some weight. We discussed benefits of wt loss as well as adverse effects of obesity. Consistency with healthy diet and low impact physical activity recommended.    Essential hypertension BP today mildly elevated, she took it after she ran to get BP monitor. Continue monitoring BP regularly. Continue low-salt diet. No changes in Benicar 20 mg daily.  Anxiety disorder Problem is well controlled. No changes in Cymbalta or diazepam. Some side effects of medication discussed. Follow-up in 6 months, before if needed.     I discussed the assessment and treatment plan with the patient. The patient was provided an opportunity to ask questions and all were answered. The patient agreed with the plan  and demonstrated an understanding of the instructions.    Return in about 6 months (around 11/17/2018).     [place], MD

## 2018-05-18 NOTE — Assessment & Plan Note (Signed)
BP today mildly elevated, she took it after she ran to get BP monitor. Continue monitoring BP regularly. Continue low-salt diet. No changes in Benicar 20 mg daily.

## 2018-07-22 ENCOUNTER — Other Ambulatory Visit: Payer: Self-pay | Admitting: Family Medicine

## 2018-07-22 DIAGNOSIS — I10 Essential (primary) hypertension: Secondary | ICD-10-CM

## 2018-08-18 ENCOUNTER — Ambulatory Visit (INDEPENDENT_AMBULATORY_CARE_PROVIDER_SITE_OTHER): Payer: Medicare Other | Admitting: Internal Medicine

## 2018-08-18 ENCOUNTER — Other Ambulatory Visit: Payer: Self-pay

## 2018-08-18 ENCOUNTER — Encounter: Payer: Self-pay | Admitting: Internal Medicine

## 2018-08-18 DIAGNOSIS — J45909 Unspecified asthma, uncomplicated: Secondary | ICD-10-CM

## 2018-08-18 MED ORDER — MONTELUKAST SODIUM 10 MG PO TABS: ORAL_TABLET | ORAL | 3 refills | Status: DC

## 2018-08-18 NOTE — Progress Notes (Signed)
Patient ID: Teresa Murphy, female    DOB: 01-Jun-1950, 68 y.o.   MRN: [id number]  HPI female nonsmoker followed for Asthma, Allergic rhinitis, complicated by HBP Allergy profile 04/19/10- neg, IGe 3.8 She has new HVAC at home and new moisture barrier.  PFT-09/18/10- mild obstruction with response to BD. FEV1 1.95/ 92%; FEV1/FVC 0.76; FEF25-75 improved 48% w/ bronchodilator Skin test: Allergy skin tests show significant positives particularly for grass, tree and dust mite.  ---------------------------------------------------------------------------------------  05/01/2017- 68 year old female nonsmoker followed for Asthma, Allergic rhinitis, complicated by HBP ----Asthma: Pt states her breathing is doing well; denies any wheezing, cough or congestion at this time.  Singulair, Advair 250, ProAir hfa Had some mold and pollen exposure where [place] were [place] the beach but no major problems.  Medications work well.  No major exacerbations.  Occasional use of rescue inhaler with no sleep disturbance. Spring pollen has been heavy but rhinitis symptoms have also been controlled. Pending TKR CXR 04/25/2016- Near complete resolution of right upper lobe infiltrate when compared with the prior exam  08/18/2018- 68 year old female nonsmoker followed for Asthma, Allergic rhinitis, complicated by HBP Singulair, Advair 250, ProAir hfa -----pt states her breathing is at baseline; takes Advair & Singulair as directed, states she has not had to use her rescue inhaler for some time No exacerbations. Discussed Covid precautions, masking and summer heat. Daily Zyrtec for rhinitis. Can try others and leave off if not needed.   Review of Systems- see HPI   + = positive Constitutional:   No weight loss, night sweats,  Fevers, chills, fatigue, lassitude. HEENT:   No- headaches,  Difficulty swallowing,  Tooth/dental problems,  Sore throat,  CV:  No chest pain,  Orthopnea, PND, swelling in lower extremities,  anasarca, dizziness, palpitations GI  No heartburn, indigestion, abdominal pain, nausea, vomiting,  Resp: No shortness of breath with exertion or at rest.  No excess mucus, no productive cough,  No non-productive cough,  No coughing up of blood.  No change in color of mucus. wheezing-none.  Skin: no rash or lesions. GU:  MS:  No joint pain or swelling.  No decreased range of motion.  No back pain. Psych:  No change in mood or affect. No depression or anxiety.  No memory loss.  Objective:   Physical Exam General- Alert, Oriented, Affect-appropriate, Distress- none acute.  + Overweight Skin- rash-none, lesions- none, excoriation- none Lymphadenopathy- none Head- atraumatic            Eyes- Gross vision intact, PERRLA, conjunctivae clear secretions            Ears- Hearing, canals normal            Nose- nose clear, no-Septal dev, no-polyps, erosion, perforation             Throat- Mallampati III , mucosa clear , drainage- none, tonsils- atrophic,  Neck- flexible , trachea midline, no stridor , thyroid nl, carotid no bruit Chest - symmetrical excursion , unlabored           Heart/CV- RRR , no murmur , no gallop  , no rub, nl s1 s2                           - JVD- none , edema- none, stasis changes- none, varices- none           Lung- clear to P&A, wheeze- none,  cough-none, dullness-none, rub- none  Chest wall-  Abd-  Br/ Gen/ Rectal- Not done, not indicated Extrem- cyanosis- none, clubbing, none, atrophy- none, strength- nl Neuro- grossly intact to observation

## 2018-08-18 NOTE — Assessment & Plan Note (Signed)
She is trying to lose weight to meet requirement by Ortho before she can get knee surgery done.

## 2018-08-18 NOTE — Assessment & Plan Note (Signed)
Mild intermittent uncomplicated. Has done very well with no exacerbations. Asks montelukast refill. Very comfortable with current meds.

## 2018-08-18 NOTE — Patient Instructions (Signed)
Montelukast refilled  We can continue the other meds as before  Please call if we can help

## 2018-09-10 ENCOUNTER — Other Ambulatory Visit: Payer: Self-pay | Admitting: Family Medicine

## 2018-09-10 DIAGNOSIS — M170 Bilateral primary osteoarthritis of knee: Secondary | ICD-10-CM

## 2018-09-10 DIAGNOSIS — F411 Generalized anxiety disorder: Secondary | ICD-10-CM

## 2018-10-27 DIAGNOSIS — H52203 Unspecified astigmatism, bilateral: Secondary | ICD-10-CM | POA: Diagnosis not present

## 2018-10-27 DIAGNOSIS — H401131 Primary open-angle glaucoma, bilateral, mild stage: Secondary | ICD-10-CM | POA: Diagnosis not present

## 2018-10-27 DIAGNOSIS — Z961 Presence of intraocular lens: Secondary | ICD-10-CM | POA: Diagnosis not present

## 2018-11-02 DIAGNOSIS — Z1159 Encounter for screening for other viral diseases: Secondary | ICD-10-CM | POA: Diagnosis not present

## 2018-11-05 ENCOUNTER — Encounter: Payer: Self-pay | Admitting: Family Medicine

## 2018-11-05 DIAGNOSIS — K5730 Diverticulosis of large intestine without perforation or abscess without bleeding: Secondary | ICD-10-CM | POA: Diagnosis not present

## 2018-11-05 DIAGNOSIS — D123 Benign neoplasm of transverse colon: Secondary | ICD-10-CM | POA: Diagnosis not present

## 2018-11-05 DIAGNOSIS — K648 Other hemorrhoids: Secondary | ICD-10-CM | POA: Diagnosis not present

## 2018-11-05 DIAGNOSIS — Z1211 Encounter for screening for malignant neoplasm of colon: Secondary | ICD-10-CM | POA: Diagnosis not present

## 2018-11-05 DIAGNOSIS — D122 Benign neoplasm of ascending colon: Secondary | ICD-10-CM | POA: Diagnosis not present

## 2018-11-11 DIAGNOSIS — D122 Benign neoplasm of ascending colon: Secondary | ICD-10-CM | POA: Diagnosis not present

## 2018-11-11 DIAGNOSIS — D123 Benign neoplasm of transverse colon: Secondary | ICD-10-CM | POA: Diagnosis not present

## 2018-11-24 ENCOUNTER — Other Ambulatory Visit (HOSPITAL_BASED_OUTPATIENT_CLINIC_OR_DEPARTMENT_OTHER): Payer: Self-pay | Admitting: Family Medicine

## 2018-11-24 DIAGNOSIS — Z1231 Encounter for screening mammogram for malignant neoplasm of breast: Secondary | ICD-10-CM

## 2018-11-27 ENCOUNTER — Ambulatory Visit (HOSPITAL_BASED_OUTPATIENT_CLINIC_OR_DEPARTMENT_OTHER): Payer: Medicare Other

## 2018-11-30 ENCOUNTER — Other Ambulatory Visit: Payer: Self-pay

## 2018-11-30 ENCOUNTER — Ambulatory Visit (HOSPITAL_BASED_OUTPATIENT_CLINIC_OR_DEPARTMENT_OTHER)
Admission: RE | Admit: 2018-11-30 | Discharge: 2018-11-30 | Disposition: A | Discharge status: Home / Self Care | Payer: Medicare Other | Source: Ambulatory Visit | Attending: Family Medicine | Admitting: Family Medicine

## 2018-11-30 ENCOUNTER — Encounter (HOSPITAL_BASED_OUTPATIENT_CLINIC_OR_DEPARTMENT_OTHER): Payer: Self-pay

## 2018-11-30 DIAGNOSIS — Z1231 Encounter for screening mammogram for malignant neoplasm of breast: Secondary | ICD-10-CM | POA: Insufficient documentation

## 2018-12-21 DIAGNOSIS — Z23 Encounter for immunization: Secondary | ICD-10-CM | POA: Diagnosis not present

## 2018-12-30 ENCOUNTER — Other Ambulatory Visit: Payer: Self-pay

## 2019-02-15 ENCOUNTER — Encounter: Payer: Self-pay | Admitting: Family Medicine

## 2019-03-16 ENCOUNTER — Other Ambulatory Visit: Payer: Self-pay | Admitting: Family Medicine

## 2019-03-16 DIAGNOSIS — F411 Generalized anxiety disorder: Secondary | ICD-10-CM

## 2019-03-16 DIAGNOSIS — M170 Bilateral primary osteoarthritis of knee: Secondary | ICD-10-CM

## 2019-07-20 ENCOUNTER — Other Ambulatory Visit: Payer: Self-pay | Admitting: Family Medicine

## 2019-07-20 DIAGNOSIS — E559 Vitamin D deficiency, unspecified: Secondary | ICD-10-CM

## 2019-08-18 ENCOUNTER — Ambulatory Visit (INDEPENDENT_AMBULATORY_CARE_PROVIDER_SITE_OTHER): Payer: Medicare Other | Admitting: Internal Medicine

## 2019-08-18 ENCOUNTER — Other Ambulatory Visit: Payer: Self-pay

## 2019-08-18 ENCOUNTER — Encounter: Payer: Self-pay | Admitting: Internal Medicine

## 2019-08-18 DIAGNOSIS — J45909 Unspecified asthma, uncomplicated: Secondary | ICD-10-CM | POA: Diagnosis not present

## 2019-08-18 NOTE — Patient Instructions (Signed)
We can continue current meds  Please cal if we can help 

## 2019-08-18 NOTE — Progress Notes (Signed)
Patient ID: Teresa Murphy, female    DOB: 1950/05/23, 69 y.o.   MRN: [id number]  HPI female nonsmoker followed for Asthma, Allergic rhinitis, complicated by HBP Allergy profile 04/19/10- neg, IGe 3.8 She has new HVAC at home and new moisture barrier.  PFT-09/18/10- mild obstruction with response to BD. FEV1 1.95/ 92%; FEV1/FVC 0.76; FEF25-75 improved 48% w/ bronchodilator Skin test: Allergy skin tests show significant positives particularly for grass, tree and dust mite.  ---------------------------------------------------------------------------------------   08/18/2018- 69 year old female nonsmoker followed for Asthma, Allergic rhinitis, complicated by HBP Singulair, Advair 250, ProAir hfa -----pt states her breathing is at baseline; takes Advair & Singulair as directed, states she has not had to use her rescue inhaler for some time No exacerbations. Discussed Covid precautions, masking and summer heat. Daily Zyrtec for rhinitis. Can try others and leave off if not needed.   08/18/19- 69 year old female never smoker followed for Asthma, Allergic rhinitis, complicated by HBP Singulair, Advair 250, ProAir hfa -----Asthma/Bronchitis, no new issues. Uses rescue inhaler not more than once most days.  Had 2 Phizer Covax. No infection.  Talked about family stress- husband on peritoneal dialysis Pending TKR. She is stable, but dealing with husband who is on peritoneal dialysis.  Review of Systems- see HPI   + = positive Constitutional:   No weight loss, night sweats,  Fevers, chills, fatigue, lassitude. HEENT:   No- headaches,  Difficulty swallowing,  Tooth/dental problems,  Sore throat,  CV:  No chest pain,  Orthopnea, PND, swelling in lower extremities, anasarca, dizziness, palpitations GI  No heartburn, indigestion, abdominal pain, nausea, vomiting,  Resp: No shortness of breath with exertion or at rest.  No excess mucus, no productive cough,  No non-productive cough,  No coughing up of  blood.  No change in color of mucus. wheezing-none.  Skin: no rash or lesions. GU:  MS:  No joint pain or swelling.  No decreased range of motion.  No back pain. Psych:  No change in mood or affect. No depression or anxiety.  No memory loss.  Objective:   Physical Exam General- Alert, Oriented, Affect-appropriate, Distress- none acute. + Overweight Skin- rash-none, lesions- none, excoriation- none, + lipoma L shoulder Lymphadenopathy- none Head- atraumatic            Eyes- Gross vision intact, PERRLA, conjunctivae clear secretions            Ears- Hearing, canals normal            Nose- nose clear, no-Septal dev, no-polyps, erosion, perforation             Throat- Mallampati III , mucosa clear , drainage- none, tonsils- atrophic,  Neck- flexible , trachea midline, no stridor , thyroid nl, carotid no bruit Chest - symmetrical excursion , unlabored           Heart/CV- RRR , no murmur , no gallop  , no rub, nl s1 s2                           - JVD- none , edema- none, stasis changes- none, varices- none           Lung- clear to P&A, wheeze- none,  cough-none, dullness-none, rub- none           Chest wall- + lipoma L shoulder Abd-  Br/ Gen/ Rectal- Not done, not indicated Extrem- cyanosis- none, clubbing, none, atrophy- none, strength- nl Neuro- grossly intact to observation

## 2019-08-19 ENCOUNTER — Other Ambulatory Visit: Payer: Self-pay | Admitting: Family Medicine

## 2019-08-19 ENCOUNTER — Other Ambulatory Visit: Payer: Self-pay | Admitting: Internal Medicine

## 2019-08-19 DIAGNOSIS — E559 Vitamin D deficiency, unspecified: Secondary | ICD-10-CM

## 2019-08-19 DIAGNOSIS — I10 Essential (primary) hypertension: Secondary | ICD-10-CM

## 2019-08-30 ENCOUNTER — Other Ambulatory Visit: Payer: Self-pay | Admitting: Family Medicine

## 2019-08-30 DIAGNOSIS — M170 Bilateral primary osteoarthritis of knee: Secondary | ICD-10-CM

## 2019-08-30 DIAGNOSIS — F411 Generalized anxiety disorder: Secondary | ICD-10-CM

## 2019-10-23 NOTE — Assessment & Plan Note (Signed)
Encourage diet and exercise. Real change would need commitment to lifestyle change.

## 2019-10-23 NOTE — Assessment & Plan Note (Signed)
Moderate persistent uncomplicated. Doing well. Plan- continue present meds

## 2019-11-02 DIAGNOSIS — H401131 Primary open-angle glaucoma, bilateral, mild stage: Secondary | ICD-10-CM | POA: Diagnosis not present

## 2019-11-20 ENCOUNTER — Ambulatory Visit: Payer: Medicare Other | Attending: Internal Medicine

## 2019-11-20 DIAGNOSIS — Z23 Encounter for immunization: Secondary | ICD-10-CM

## 2019-11-20 NOTE — Progress Notes (Signed)
   Covid-19 Vaccination Clinic  Name:  Teresa Murphy    MRN: [id number] DOB: [birth year]  11/20/2019  [name] was observed post Covid-19 immunization for 15 minutes without incident. She was provided with Vaccine Information Sheet and instruction to access the V-Safe system.   [name] was instructed to call 911 with any severe reactions post vaccine: Marland Kitchen Difficulty breathing  . Swelling of face and throat  . A fast heartbeat  . A bad rash all over body  . Dizziness and weakness

## 2019-12-03 DIAGNOSIS — H5340 Unspecified visual field defects: Secondary | ICD-10-CM | POA: Diagnosis not present

## 2019-12-03 DIAGNOSIS — H401131 Primary open-angle glaucoma, bilateral, mild stage: Secondary | ICD-10-CM | POA: Diagnosis not present

## 2019-12-03 DIAGNOSIS — H52203 Unspecified astigmatism, bilateral: Secondary | ICD-10-CM | POA: Diagnosis not present

## 2019-12-03 DIAGNOSIS — Z961 Presence of intraocular lens: Secondary | ICD-10-CM | POA: Diagnosis not present

## 2019-12-14 ENCOUNTER — Encounter: Payer: Medicare Other | Admitting: Family Medicine

## 2019-12-21 ENCOUNTER — Encounter: Payer: Self-pay | Admitting: Family Medicine

## 2019-12-21 ENCOUNTER — Other Ambulatory Visit: Payer: Self-pay

## 2019-12-21 ENCOUNTER — Ambulatory Visit (INDEPENDENT_AMBULATORY_CARE_PROVIDER_SITE_OTHER): Payer: Medicare Other | Admitting: Family Medicine

## 2019-12-21 VITALS — BP 128/70 | HR 77 | Temp 98.6°F | Resp 16 | Ht 63.0 in | Wt 217.8 lb

## 2019-12-21 DIAGNOSIS — M170 Bilateral primary osteoarthritis of knee: Secondary | ICD-10-CM | POA: Diagnosis not present

## 2019-12-21 DIAGNOSIS — F411 Generalized anxiety disorder: Secondary | ICD-10-CM

## 2019-12-21 DIAGNOSIS — Z23 Encounter for immunization: Secondary | ICD-10-CM | POA: Diagnosis not present

## 2019-12-21 DIAGNOSIS — E559 Vitamin D deficiency, unspecified: Secondary | ICD-10-CM | POA: Diagnosis not present

## 2019-12-21 DIAGNOSIS — E785 Hyperlipidemia, unspecified: Secondary | ICD-10-CM | POA: Diagnosis not present

## 2019-12-21 DIAGNOSIS — I10 Essential (primary) hypertension: Secondary | ICD-10-CM | POA: Diagnosis not present

## 2019-12-21 DIAGNOSIS — R222 Localized swelling, mass and lump, trunk: Secondary | ICD-10-CM | POA: Diagnosis not present

## 2019-12-21 DIAGNOSIS — Z780 Asymptomatic menopausal state: Secondary | ICD-10-CM | POA: Diagnosis not present

## 2019-12-21 DIAGNOSIS — Z0000 Encounter for general adult medical examination without abnormal findings: Secondary | ICD-10-CM

## 2019-12-21 MED ORDER — DIAZEPAM 5 MG PO TABS: 5.0000 mg | ORAL_TABLET | Freq: Every day | ORAL | 1 refills | Status: DC | PRN

## 2019-12-21 MED ORDER — OLMESARTAN MEDOXOMIL 20 MG PO TABS: 20.0000 mg | ORAL_TABLET | Freq: Every day | ORAL | 2 refills | Status: DC

## 2019-12-21 MED ORDER — DULOXETINE HCL 60 MG PO CPEP: 60.0000 mg | ORAL_CAPSULE | Freq: Every day | ORAL | 2 refills | Status: DC

## 2019-12-21 NOTE — Progress Notes (Signed)
HPI: Teresa Murphy is a 69 y.o. female, who is here today for AWV and follow up.   She was last seen on 05/18/18. No new problems since her last visit.  She lives with her husband.. Independent ADL's and IADL's.  Functional Status Survey: Is the patient deaf or have difficulty hearing?: No Does the patient have difficulty seeing, even when wearing glasses/contacts?: No Does the patient have difficulty concentrating, remembering, or making decisions?: No Does the patient have difficulty walking or climbing stairs?: No Does the patient have difficulty dressing or bathing?: No Does the patient have difficulty doing errands alone such as visiting a doctor's office or shopping?: No  Fall Risk  12/21/2019 12/30/2018 12/25/2017  Falls in the past year? 0 0 0  Comment - Emmi Telephone Survey: data to providers prior to load Emmi Telephone Survey: data to providers prior to load  Number falls in past yr: 0 - -  Injury with Fall? 0 - -  Risk for fall due to : Orthopedic patient - -   Providers she sees regularly: Pulmonologist: Dr Annamaria Boots, last seen on 08/18/19. Eye care provider: Dr Harlin Heys eye exam 11/2018. Ortho: Dr Alvan Dame.  Depression screen [facility] 2/9 12/21/2019  Decreased Interest 0  Down, Depressed, Hopeless 0  PHQ - 2 Score 0    Mini-Cog - 12/21/19 2214    Normal clock drawing test? yes    How many words correct? 3           Hearing Screening   125Hz  250Hz  500Hz  1000Hz  2000Hz  3000Hz  4000Hz  6000Hz  8000Hz   Right ear:           Left ear:           Comments: Gross hearing intact,bilateral.   Visual Acuity Screening   Right eye Left eye Both eyes  Without correction: 20/25 20/25 20/25   With correction:      Knee OA: Problem getting worse. She has not had knee replacement yet.  She had some dental issues and surgery was postponed then because cancelled elective surgeries due to COVID 19 pandemia.  HLD: She is on non pharmacologic treatment.  Lab Results    Component Value Date   CHOL 224 (H) 01/11/2016   HDL 39.50 01/11/2016   LDLCALC 152 (H) 01/11/2016   TRIG 163.0 (H) 01/11/2016   CHOLHDL 6 01/11/2016   HTN: She is on Benicar 20 mg daily. Negative for severe/frequent headache, visual changes, chest pain, dyspnea, palpitation, claudication, focal weakness, or edema.  Lab Results  Component Value Date   CREATININE 0.67 11/25/2017   BUN 17 11/25/2017   NA 142 11/25/2017   K 4.1 11/25/2017   CL 104 11/25/2017   CO2 33 (H) 11/25/2017   Vit D deficiency: 25 OH vit D 40 in 09/2017. She is on Ergocalciferol 50,000 U q 5 days.  Anxiety: She is on Cymbalta 60 mg daily and Valium 5 mg daily as needed. She has been under a lot of stress, her husband has had some health problems.  She is a "stress eater", so has not been consistent with following a healthful diet but has lost some wt when she does. Due to knee pain, it is difficult for her to exercise regularly.  Review of Systems  Constitutional: Negative for activity change, appetite change, fatigue and fever.  HENT: Negative for mouth sores, nosebleeds and sore throat.   Respiratory: Negative for cough and wheezing.   Gastrointestinal: Negative for abdominal pain, nausea and vomiting.  Negative for changes in bowel habits.  Endocrine: Negative for cold intolerance, heat intolerance, polydipsia, polyphagia and polyuria.  Genitourinary: Negative for decreased urine volume, dysuria and hematuria.  Musculoskeletal: Positive for arthralgias. Negative for myalgias.  Skin: Negative for pallor and rash.  Allergic/Immunologic: Positive for environmental allergies.  Neurological: Negative for syncope, facial asymmetry and weakness.  Psychiatric/Behavioral: Negative for confusion. The patient is nervous/anxious.   Rest of ROS, see pertinent positives sand negatives in HPI  Current Outpatient Medications on File Prior to Visit  Medication Sig Dispense Refill  . albuterol (PROAIR HFA) 108  (90 Base) MCG/ACT inhaler Inhale 2 puffs into the lungs every 4 (four) hours as needed. 1 Inhaler 3  . azelastine (ASTELIN) 0.1 % nasal spray Place 1 spray into the nose 2 (two) times daily as needed for rhinitis or allergies.     . cetirizine (ZYRTEC) 10 MG tablet Take 10 mg by mouth at bedtime.     . Fluticasone-Salmeterol (ADVAIR DISKUS) 250-50 MCG/DOSE AEPB INHALE 1 PUFF INTO LUNGS TWICE A DAY RINSE AFTER USE 3 each 0  . latanoprost (XALATAN) 0.005 % ophthalmic solution Place 1 drop into both eyes at bedtime.  11  . Magnesium 100 MG CAPS Take 100 mg by mouth daily.    . montelukast (SINGULAIR) 10 MG tablet TAKE 1 TABLET BY MOUTH EVERYDAY AT BEDTIME 90 tablet 3  . Multiple Vitamins-Minerals (MULTIVITAMIN WOMEN 50+) TABS Take 1 tablet by mouth daily.    . naproxen sodium (ANAPROX) 220 MG tablet Take 220 mg by mouth 2 (two) times daily with a meal.    . Vitamin D, Ergocalciferol, (DRISDOL) 1.25 MG (50000 UNIT) CAPS capsule TAKE ONE CAPSULE BY MOUTH EVERY 5 DAYS 18 capsule 2   No current facility-administered medications on file prior to visit.   Past Medical History:  Diagnosis Date  . Allergic rhinitis   . Asthma   . Headache(784.0)   . Hypertension    No Known Allergies  Social History   Socioeconomic History  . Marital status: Married    Spouse name: [name]  . Number of children: 0  . Years of education: College  . Highest education level: Not on file  Occupational History  . Occupation: retired  Tobacco Use  . Smoking status: Never Smoker  . Smokeless tobacco: Never Used  Substance and Sexual Activity  . Alcohol use: No    Alcohol/week: 0.0 standard drinks  . Drug use: No  . Sexual activity: Yes    Birth control/protection: Surgical  Other Topics Concern  . Not on file  Social History Narrative   Patient lives at home with spouse.   Caffeine Use:    Social Determinants of Health   Financial Resource Strain:   . Difficulty of Paying Living Expenses: Not on file    Food Insecurity:   . Worried About Charity fundraiser in the Last Year: Not on file  . Ran Out of Food in the Last Year: Not on file  Transportation Needs:   . Lack of Transportation (Medical): Not on file  . Lack of Transportation (Non-Medical): Not on file  Physical Activity:   . Days of Exercise per Week: Not on file  . Minutes of Exercise per Session: Not on file  Stress:   . Feeling of Stress : Not on file  Social Connections:   . Frequency of Communication with Friends and Family: Not on file  . Frequency of Social Gatherings with Friends and Family: Not on file  .  Attends Religious Services: Not on file  . Active Member of Clubs or Organizations: Not on file  . Attends Archivist Meetings: Not on file  . Marital Status: Not on file   Vitals:   12/21/19 1030  BP: 128/70  Pulse: 77  Resp: 16  Temp: 98.6 F (37 C)  SpO2: 98%   Body mass index is 38.58 kg/m.  Physical Exam Vitals and nursing note reviewed.  Constitutional:      General: She is not in acute distress.    Appearance: She is well-developed.  HENT:     Head: Normocephalic and atraumatic.     Mouth/Throat:     Mouth: Mucous membranes are moist.     Pharynx: Oropharynx is clear.  Eyes:     Conjunctiva/sclera: Conjunctivae normal.     Pupils: Pupils are equal, round, and reactive to light.  Cardiovascular:     Rate and Rhythm: Normal rate and regular rhythm.     Pulses:          Dorsalis pedis pulses are 2+ on the right side and 2+ on the left side.     Heart sounds: No murmur heard.   Pulmonary:     Effort: Pulmonary effort is normal. No respiratory distress.     Breath sounds: Normal breath sounds.  Abdominal:     Palpations: Abdomen is soft. There is no hepatomegaly or mass.     Tenderness: There is no abdominal tenderness.  Lymphadenopathy:     Cervical: No cervical adenopathy.     Upper Body:     Right upper body: No supraclavicular adenopathy.     Comments: Left  supraclavicular fossa fullness, ? Mass.  Skin:    General: Skin is warm.     Findings: No erythema or rash.  Neurological:     Mental Status: She is alert and oriented to person, place, and time.     Cranial Nerves: No cranial nerve deficit.     Comments: Antalgic gait.  Psychiatric:     Comments: Well groomed, good eye contact.   ASSESSMENT AND PLAN:  Teresa Murphy was seen today for AWV and follow-up.  Orders Placed This Encounter  Procedures  . DG Bone Density  . US Soft Tissue Head/Neck (NON-THYROID)  . Flu Vaccine QUAD High Dose(Fluad)  . COMPLETE METABOLIC PANEL WITH GFR  . Lipid panel  . VITAMIN D 25 Hydroxy (Vit-D Deficiency, Fractures)   Lab Results  Component Value Date   CHOL 222 (H) 12/21/2019   HDL 42 (L) 12/21/2019   LDLCALC 142 (H) 12/21/2019   TRIG 248 (H) 12/21/2019   CHOLHDL 5.3 (H) 12/21/2019   Lab Results  Component Value Date   CREATININE 0.69 12/21/2019   BUN 15 12/21/2019   NA 145 12/21/2019   K 4.6 12/21/2019   CL 105 12/21/2019   CO2 29 12/21/2019   Lab Results  Component Value Date   ALT 16 12/21/2019   AST 22 12/21/2019   ALKPHOS 94 11/25/2017   BILITOT 0.6 12/21/2019   Medicare annual wellness visit, subsequent We discussed the importance of staying active, physically and mentally, as well as the benefits of a healthy/balance diet. Low impact exercise that involve stretching and strengthing are ideal. Vaccines: Due for pneumovax. We discussed preventive screening for the next 5-10 years, summery of recommendations given in AVS: Colonoscopy on 11/05/18. Mammogram: 11/30/18. DEXA 09/18/2013. Fall prevention.  Advance directives and end of life discussed, she has POA and living will.  Vitamin D deficiency Continue Ergocalciferol 50,000 U q 5 days. We will adjust treatment according to 25 OH vit D result.  Hyperlipidemia, unspecified hyperlipidemia type Continue non pharmacologic treatment. Further recommendations  according to [place] result/10 year CVD risk.  The 10-year ASCVD risk score Mikey Bussing DC Brooke Bonito., et al., 2013) is: 14.9%   Values used to calculate the score:     Age: 58 years     Sex: Female     Is Non-Hispanic African American: No     Diabetic: No     Tobacco smoker: No     Systolic Blood Pressure: 299 mmHg     Is BP treated: Yes     HDL Cholesterol: 42 mg/dL     Total Cholesterol: 222 mg/dL  Essential hypertension BP adequately controlled. Continue Olmesartan 20 mg daily. Low salt diet.  -     olmesartan (BENICAR) 20 MG tablet; Take 1 tablet (20 mg total) by mouth daily.  Generalized anxiety disorder Stable overall. No changes in Duloxetine or valium dose.  -     DULoxetine (CYMBALTA) 60 MG capsule; Take 1 capsule (60 mg total) by mouth daily. -     diazepam (VALIUM) 5 MG tablet; Take 1 tablet (5 mg total) by mouth daily as needed for anxiety.  Osteoarthritis of both knees, unspecified osteoarthritis type Continue Duloxetine 60 mg daily. Wt loss will help. She is going to try to see Dr Wynelle Link to discuss knee arthoplasty.  -     DULoxetine (CYMBALTA) 60 MG capsule; Take 1 capsule (60 mg total) by mouth daily.  Supraclavicular fossa fullness ? Lipoma. Soft tissue US will be arranged.  Need for influenza vaccination -     Flu Vaccine QUAD High Dose(Fluad)  Asymptomatic postmenopausal estrogen deficiency - DG Bone Density; Future   Return in about 6 months (around 06/19/2020).  Amaree Leeper G. [place], MD  [facility]. [place] office.  Ms. Gilchrest , Thank you for taking time to come for your Medicare Wellness Visit. I appreciate your ongoing commitment to your health goals. Please review the following plan we discussed and let me know if I can assist you in the future.   These are the goals we discussed: Goals    . DIET - REDUCE CALORIE INTAKE     1800 cal/day.    . Exercise 3x per week (30 min per time)     10-15 min of walking at least 5 times per week.        This is a list of the screening recommended for you and due dates:  Health Maintenance  Topic Date Due  .  Hepatitis C: One time screening is recommended by Center for Disease Control  (CDC) for  adults born from [birth year] through 1967.   Never done  . Pneumonia vaccines (2 of 2 - PPSV23) 02/04/2014  . Flu Shot  09/05/2019  . Tetanus Vaccine  02/10/2020*  . Mammogram  11/29/2020  . Colon Cancer Screening  11/04/2028  . DEXA scan (bone density measurement)  Completed  . COVID-19 Vaccine  Completed  *Topic was postponed. The date shown is not the original due date.   A few tips:  -As we age balance is not as good as it was, so there is a higher risks for falls. Please remove small rugs and furniture that is "in your way" and could increase the risk of falls. Stretching exercises may help with fall prevention: Yoga and Tai Chi are some examples. Low impact exercise is  better, so you are not very achy the next day.  -Sun screen and avoidance of direct sun light recommended. Caution with dehydration, if working outdoors be sure to drink enough fluids.  - Some medications are not safe as we age, increases the risk of side effects and can potentially interact with other medication you are also taken;  including some of over the counter medications. Be sure to let me know when you start a new medication even if it is a dietary/vitamin supplement.   -Healthy diet low in red meet/animal fat and sugar + regular physical activity is recommended.    A few things to remember from today's visit:  Vitamin D deficiency - Plan: VITAMIN D 25 Hydroxy (Vit-D Deficiency, Fractures)  Hyperlipidemia, unspecified hyperlipidemia type - Plan: COMPLETE METABOLIC PANEL WITH GFR, Lipid panel  Essential hypertension - Plan: COMPLETE METABOLIC PANEL WITH GFR, olmesartan (BENICAR) 20 MG tablet  Generalized anxiety disorder - Plan: DULoxetine (CYMBALTA) 60 MG capsule, diazepam (VALIUM) 5 MG tablet  Osteoarthritis of  both knees, unspecified osteoarthritis type - Plan: DULoxetine (CYMBALTA) 60 MG capsule  If you need refills please call your pharmacy. Do not use My Chart to request refills or for acute issues that need immediate attention.    Please be sure medication list is accurate. If a new problem present, please set up appointment sooner than planned today.

## 2019-12-21 NOTE — Patient Instructions (Addendum)
  Ms. Gessel , Thank you for taking time to come for your Medicare Wellness Visit. I appreciate your ongoing commitment to your health goals. Please review the following plan we discussed and let me know if I can assist you in the future.   These are the goals we discussed: Goals    . DIET - REDUCE CALORIE INTAKE     1800 cal/day.    . Exercise 3x per week (30 min per time)     10-15 min of walking at least 5 times per week.       This is a list of the screening recommended for you and due dates:  Health Maintenance  Topic Date Due  .  Hepatitis C: One time screening is recommended by Center for Disease Control  (CDC) for  adults born from [birth year] through 1967.   Never done  . Pneumonia vaccines (2 of 2 - PPSV23) 02/04/2014  . Flu Shot  09/05/2019  . Tetanus Vaccine  02/10/2020*  . Mammogram  11/29/2020  . Colon Cancer Screening  11/04/2028  . DEXA scan (bone density measurement)  Completed  . COVID-19 Vaccine  Completed  *Topic was postponed. The date shown is not the original due date.   A few tips:  -As we age balance is not as good as it was, so there is a higher risks for falls. Please remove small rugs and furniture that is "in your way" and could increase the risk of falls. Stretching exercises may help with fall prevention: Yoga and Tai Chi are some examples. Low impact exercise is better, so you are not very achy the next day.  -Sun screen and avoidance of direct sun light recommended. Caution with dehydration, if working outdoors be sure to drink enough fluids.  - Some medications are not safe as we age, increases the risk of side effects and can potentially interact with other medication you are also taken;  including some of over the counter medications. Be sure to let me know when you start a new medication even if it is a dietary/vitamin supplement.   -Healthy diet low in red meet/animal fat and sugar + regular physical activity is recommended.    A few things to  remember from today's visit:  Vitamin D deficiency - Plan: VITAMIN D 25 Hydroxy (Vit-D Deficiency, Fractures)  Hyperlipidemia, unspecified hyperlipidemia type - Plan: COMPLETE METABOLIC PANEL WITH GFR, Lipid panel  Essential hypertension - Plan: COMPLETE METABOLIC PANEL WITH GFR, olmesartan (BENICAR) 20 MG tablet  Generalized anxiety disorder - Plan: DULoxetine (CYMBALTA) 60 MG capsule, diazepam (VALIUM) 5 MG tablet  Osteoarthritis of both knees, unspecified osteoarthritis type - Plan: DULoxetine (CYMBALTA) 60 MG capsule  If you need refills please call your pharmacy. Do not use My Chart to request refills or for acute issues that need immediate attention.    Please be sure medication list is accurate. If a new problem present, please set up appointment sooner than planned today.

## 2019-12-22 LAB — COMPLETE METABOLIC PANEL WITH GFR
AG Ratio: 1.8 (calc) (ref 1.0–2.5)
ALT: 16 U/L (ref 6–29)
AST: 22 U/L (ref 10–35)
Albumin: 4.7 g/dL (ref 3.6–5.1)
Alkaline phosphatase (APISO): 82 U/L (ref 37–153)
BUN: 15 mg/dL (ref 7–25)
CO2: 29 mmol/L (ref 20–32)
Calcium: 9.7 mg/dL (ref 8.6–10.4)
Chloride: 105 mmol/L (ref 98–110)
Creat: 0.69 mg/dL (ref 0.60–0.93)
GFR, Est African American: 102 mL/min/{1.73_m2} (ref >=60)
GFR, Est Non African American: 88 mL/min/{1.73_m2} (ref >=60)
Globulin: 2.6 g/dL (calc) (ref 1.9–3.7)
Glucose, Bld: 93 mg/dL (ref 65–99)
Potassium: 4.6 mmol/L (ref 3.5–5.3)
Sodium: 145 mmol/L (ref 135–146)
Total Bilirubin: 0.6 mg/dL (ref 0.2–1.2)
Total Protein: 7.3 g/dL (ref 6.1–8.1)

## 2019-12-22 LAB — VITAMIN D 25 HYDROXY (VIT D DEFICIENCY, FRACTURES): Vit D, 25-Hydroxy: 79 ng/mL (ref 30–100)

## 2019-12-22 LAB — LIPID PANEL
Cholesterol: 222 mg/dL — ABNORMAL HIGH (ref <200)
HDL: 42 mg/dL — ABNORMAL LOW (ref >=50)
LDL Cholesterol (Calc): 142 mg/dL (calc) — ABNORMAL HIGH
Non-HDL Cholesterol (Calc): 180 mg/dL (calc) — ABNORMAL HIGH (ref <130)
Total CHOL/HDL Ratio: 5.3 (calc) — ABNORMAL HIGH (ref <5.0)
Triglycerides: 248 mg/dL — ABNORMAL HIGH (ref <150)

## 2019-12-27 ENCOUNTER — Other Ambulatory Visit: Payer: Self-pay | Admitting: Family Medicine

## 2019-12-27 DIAGNOSIS — Z780 Asymptomatic menopausal state: Secondary | ICD-10-CM

## 2019-12-29 DIAGNOSIS — M1711 Unilateral primary osteoarthritis, right knee: Secondary | ICD-10-CM | POA: Diagnosis not present

## 2020-01-05 ENCOUNTER — Telehealth: Payer: Self-pay | Admitting: Family Medicine

## 2020-01-05 DIAGNOSIS — R9431 Abnormal electrocardiogram [ECG] [EKG]: Secondary | ICD-10-CM

## 2020-01-05 NOTE — Telephone Encounter (Signed)
Can you schedule pt for a nurse visit just for an Ekg? It just needs to be on a day that both Dr. Martinique and I are here. I can do it on Friday if she's available.

## 2020-01-05 NOTE — Telephone Encounter (Signed)
Do I need to have her come in just for an EKG?

## 2020-01-05 NOTE — Telephone Encounter (Signed)
Patient wanted to let Dr. Martinique know that [facility] is faxing a Medical Clearance form to have completed and faxed back.  The patient is having knee surgery on 12/14 by Dr. Alvan Dame  The patient was last seen by Dr. Martinique on 11/16

## 2020-01-05 NOTE — Telephone Encounter (Signed)
Patient is scheduled for nurse visit for an EKG on Friday 12/03 ay 2:15 PM

## 2020-01-07 ENCOUNTER — Other Ambulatory Visit: Payer: Self-pay

## 2020-01-07 ENCOUNTER — Ambulatory Visit (INDEPENDENT_AMBULATORY_CARE_PROVIDER_SITE_OTHER): Payer: Medicare Other

## 2020-01-07 DIAGNOSIS — Z01818 Encounter for other preprocedural examination: Secondary | ICD-10-CM

## 2020-01-07 NOTE — Progress Notes (Signed)
Pt came in for her EKG for her surgery clearance. Copy of EKG sent to scan.

## 2020-01-07 NOTE — Telephone Encounter (Signed)
Reviewed EKG done today and noted some changes when compared with EKG done in 11/2017. Poor R progression on precordial leads, QS in V3 and V6 artefact. I am recommending cardiologist evaluation before surgery due to abnormal EKG. Referral placed. Thanks, BJ

## 2020-01-07 NOTE — Addendum Note (Signed)
Addended by: Martinique, Margaretann Abate G on: 01/07/2020 04:43 PM   Modules accepted: Orders

## 2020-01-10 NOTE — Progress Notes (Signed)
Need orders in epic. LVMM for [name].

## 2020-01-11 ENCOUNTER — Telehealth: Payer: Self-pay | Admitting: Family Medicine

## 2020-01-11 ENCOUNTER — Other Ambulatory Visit: Payer: Self-pay

## 2020-01-11 ENCOUNTER — Encounter (HOSPITAL_COMMUNITY)
Admission: RE | Admit: 2020-01-11 | Discharge: 2020-01-11 | Disposition: A | Discharge status: Home / Self Care | Payer: Medicare Other | Source: Ambulatory Visit | Attending: Orthopedic Surgery | Admitting: Orthopedic Surgery

## 2020-01-11 ENCOUNTER — Encounter (HOSPITAL_COMMUNITY): Payer: Self-pay

## 2020-01-11 DIAGNOSIS — Z01812 Encounter for preprocedural laboratory examination: Secondary | ICD-10-CM | POA: Diagnosis not present

## 2020-01-11 HISTORY — DX: Unspecified osteoarthritis, unspecified site: M19.90

## 2020-01-11 HISTORY — DX: Pneumonia, unspecified organism: J18.9

## 2020-01-11 HISTORY — DX: Anxiety disorder, unspecified: F41.9

## 2020-01-11 HISTORY — DX: Personal history of urinary calculi: Z87.442

## 2020-01-11 HISTORY — DX: Depression, unspecified: F32.A

## 2020-01-11 LAB — CBC
HCT: 41.7 % (ref 36.0–46.0)
Hemoglobin: 14 g/dL (ref 12.0–15.0)
MCH: 31.3 pg (ref 26.0–34.0)
MCHC: 33.6 g/dL (ref 30.0–36.0)
MCV: 93.3 fL (ref 80.0–100.0)
Platelets: 239 10*3/uL (ref 150–400)
RBC: 4.47 MIL/uL (ref 3.87–5.11)
RDW: 12.2 % (ref 11.5–15.5)
WBC: 7 10*3/uL (ref 4.0–10.5)
nRBC: 0 % (ref 0.0–0.2)

## 2020-01-11 LAB — BASIC METABOLIC PANEL
Anion gap: 8 (ref 5–15)
BUN: 22 mg/dL (ref 8–23)
CO2: 28 mmol/L (ref 22–32)
Calcium: 9.1 mg/dL (ref 8.9–10.3)
Chloride: 103 mmol/L (ref 98–111)
Creatinine, Ser: 0.65 mg/dL (ref 0.44–1.00)
GFR, Estimated: >60 mL/min (ref >60)
Glucose, Bld: 98 mg/dL (ref 70–99)
Potassium: 4.6 mmol/L (ref 3.5–5.1)
Sodium: 139 mmol/L (ref 135–145)

## 2020-01-11 LAB — SURGICAL PCR SCREEN
MRSA, PCR: NEGATIVE
Staphylococcus aureus: NEGATIVE

## 2020-01-11 NOTE — Telephone Encounter (Signed)
Teresa Murphy with precert at [facility] is calling to see if they can get a copy of the last EKG that was done when the pt was in the office last.  It can be faxed to [phone] pt is having surgery on 01/18/2020.

## 2020-01-11 NOTE — Progress Notes (Addendum)
Anesthesia Review:  PCP: DR Betty Martinique - [facility] 01/07/20- performed EKG reported as abnormal and referred to cardiologoy To see  Cardiologist : DR Shirlee More 01/17/2020 at 1540pm for cardiac clearance.   DR Baird Lyons- pulmonary- [facility] 08/18/2019  Chest x-ray : EKG :01/07/2020 - have requested copy of ekg done 01/07/2020 from DR Betty Martinique office - on chart  Echo : Stress test: Cardiac Cath :  Activity level: cannot do a flight of stairs without difficulty  Sleep Study/ CPAP : no  Fasting Blood Sugar :      / Checks Blood Sugar -- times a day:   Blood Thinner/ Instructions /Last Dose: ASA / Instructions/ Last Dose :

## 2020-01-11 NOTE — Progress Notes (Signed)
DUE TO COVID-19 ONLY ONE VISITOR IS ALLOWED TO COME WITH YOU AND STAY IN THE WAITING ROOM ONLY DURING PRE OP AND PROCEDURE DAY OF SURGERY. THE 1 VISITOR  MAY VISIT WITH YOU AFTER SURGERY IN YOUR PRIVATE ROOM DURING VISITING HOURS ONLY!  YOU NEED TO HAVE A COVID 19 TEST ON__12/11/2019 _____ @_______ , THIS TEST MUST BE DONE BEFORE SURGERY,  COVID TESTING SITE [address] [place] [address], IT IS ON THE RIGHT GOING OUT [address] APPROXIMATELY  2 MINUTES PAST [employer] ON THE RIGHT. ONCE YOUR COVID TEST IS COMPLETED,  PLEASE BEGIN THE QUARANTINE INSTRUCTIONS AS OUTLINED IN YOUR HANDOUT.                Teresa Murphy  01/11/2020   Your procedure is scheduled on: 01/18/2020    Report to [facility] Main  Entrance   Report to admitting at     1010 AM     Call this number if you have problems the morning of surgery [phone]    REMEMBER: NO  SOLID FOOD CANDY OR GUM AFTER MIDNIGHT. CLEAR LIQUIDS UNTIL0950am          . NOTHING BY MOUTH EXCEPT CLEAR LIQUIDS UNTIL    . PLEASE FINISH ENSURE DRINK PER SURGEON ORDER  WHICH NEEDS TO BE COMPLETED AT    0950am     CLEAR LIQUID DIET   Foods Allowed                                                                    Coffee and tea, regular and decaf                            Fruit ices (not with fruit pulp)                                      Iced Popsicles                                    Carbonated beverages, regular and diet                                    Cranberry, grape and apple juices Sports drinks like Gatorade Lightly seasoned clear broth or consume(fat free) Sugar, honey syrup ___________________________________________________________________      BRUSH YOUR TEETH MORNING OF SURGERY AND RINSE YOUR MOUTH OUT, NO CHEWING GUM CANDY OR MINTS.     Take these medicines the morning of surgery with A SIP OF WATER: Inhalers as usual and bring, nasal spray if needed, cymbalta   DO NOT TAKE ANY  DIABETIC MEDICATIONS DAY OF YOUR SURGERY                               You may not have any metal on your body including hair pins and  piercings  Do not wear jewelry, make-up, lotions, powders or perfumes, deodorant             Do not wear nail polish on your fingernails.  Do not shave  48 hours prior to surgery.              Men may shave face and neck.   Do not bring valuables to the hospital. [place].  Contacts, dentures or bridgework may not be worn into surgery.  Leave suitcase in the car. After surgery it may be brought to your room.     Patients discharged the day of surgery will not be allowed to drive home. IF YOU ARE HAVING SURGERY AND GOING HOME THE SAME DAY, YOU MUST HAVE AN ADULT TO DRIVE YOU HOME AND BE WITH YOU FOR 24 HOURS. YOU MAY GO HOME BY TAXI OR UBER OR ORTHERWISE, BUT AN ADULT MUST ACCOMPANY YOU HOME AND STAY WITH YOU FOR 24 HOURS.  Name and phone number of your driver:  Special Instructions: N/A              Please read over the following fact sheets you were given: _____________________________________________________________________  [facility] - Preparing for Surgery Before surgery, you can play an important role.  Because skin is not sterile, your skin needs to be as free of germs as possible.  You can reduce the number of germs on your skin by washing with CHG (chlorahexidine gluconate) soap before surgery.  CHG is an antiseptic cleaner which kills germs and bonds with the skin to continue killing germs even after washing. Please DO NOT use if you have an allergy to CHG or antibacterial soaps.  If your skin becomes reddened/irritated stop using the CHG and inform your nurse when you arrive at Short Stay. Do not shave (including legs and underarms) for at least 48 hours prior to the first CHG shower.  You may shave your face/neck. Please follow these instructions carefully:  1.  Shower with CHG Soap  the night before surgery and the  morning of Surgery.  2.  If you choose to wash your hair, wash your hair first as usual with your  normal  shampoo.  3.  After you shampoo, rinse your hair and body thoroughly to remove the  shampoo.                           4.  Use CHG as you would any other liquid soap.  You can apply chg directly  to the skin and wash                       Gently with a scrungie or clean washcloth.  5.  Apply the CHG Soap to your body ONLY FROM THE NECK DOWN.   Do not use on face/ open                           Wound or open sores. Avoid contact with eyes, ears mouth and genitals (private parts).                       Wash face,  Genitals (private parts) with your normal soap.             6.  Wash thoroughly, paying special attention to the area where your surgery  will be performed.  7.  Thoroughly rinse your body with warm water from the neck down.  8.  DO NOT shower/wash with your normal soap after using and rinsing off  the CHG Soap.                9.  Pat yourself dry with a clean towel.            10.  Wear clean pajamas.            11.  Place clean sheets on your bed the night of your first shower and do not  sleep with pets. Day of Surgery : Do not apply any lotions/deodorants the morning of surgery.  Please wear clean clothes to the hospital/surgery center.  FAILURE TO FOLLOW THESE INSTRUCTIONS MAY RESULT IN THE CANCELLATION OF YOUR SURGERY PATIENT SIGNATURE_________________________________  NURSE SIGNATURE__________________________________  ________________________________________________________________________

## 2020-01-11 NOTE — Telephone Encounter (Signed)
Patient is calling and requesting a call back regarding her EKG results, please advise. CB [phone]

## 2020-01-11 NOTE — Telephone Encounter (Signed)
Copy of EKG faxed. Received confirmation it went through.

## 2020-01-11 NOTE — Telephone Encounter (Signed)
I spoke with pt. She is aware that she needs cardiac clearance due to EKG changes. I gave pt the number to [facility], I told her to call me back if we need to try and find another office where she can be seen sooner.

## 2020-01-12 ENCOUNTER — Ambulatory Visit
Admission: RE | Admit: 2020-01-12 | Discharge: 2020-01-12 | Disposition: A | Discharge status: Home / Self Care | Payer: Medicare Other | Source: Ambulatory Visit | Attending: Family Medicine | Admitting: Family Medicine

## 2020-01-12 DIAGNOSIS — R221 Localized swelling, mass and lump, neck: Secondary | ICD-10-CM | POA: Diagnosis not present

## 2020-01-12 DIAGNOSIS — Z87442 Personal history of urinary calculi: Secondary | ICD-10-CM | POA: Insufficient documentation

## 2020-01-12 DIAGNOSIS — F32A Depression, unspecified: Secondary | ICD-10-CM | POA: Insufficient documentation

## 2020-01-12 DIAGNOSIS — R222 Localized swelling, mass and lump, trunk: Secondary | ICD-10-CM

## 2020-01-12 DIAGNOSIS — M1990 Unspecified osteoarthritis, unspecified site: Secondary | ICD-10-CM | POA: Insufficient documentation

## 2020-01-12 DIAGNOSIS — I10 Essential (primary) hypertension: Secondary | ICD-10-CM | POA: Insufficient documentation

## 2020-01-12 DIAGNOSIS — F419 Anxiety disorder, unspecified: Secondary | ICD-10-CM | POA: Insufficient documentation

## 2020-01-12 DIAGNOSIS — J189 Pneumonia, unspecified organism: Secondary | ICD-10-CM | POA: Insufficient documentation

## 2020-01-14 ENCOUNTER — Other Ambulatory Visit (HOSPITAL_COMMUNITY)
Admission: RE | Admit: 2020-01-14 | Discharge: 2020-01-14 | Disposition: A | Discharge status: Home / Self Care | Payer: Medicare Other | Source: Ambulatory Visit | Attending: Orthopedic Surgery | Admitting: Orthopedic Surgery

## 2020-01-14 DIAGNOSIS — Z01812 Encounter for preprocedural laboratory examination: Secondary | ICD-10-CM | POA: Insufficient documentation

## 2020-01-14 DIAGNOSIS — Z20822 Contact with and (suspected) exposure to covid-19: Secondary | ICD-10-CM | POA: Diagnosis not present

## 2020-01-14 LAB — SARS CORONAVIRUS 2 (TAT 6-24 HRS): SARS Coronavirus 2: NEGATIVE

## 2020-01-16 NOTE — Progress Notes (Deleted)
Cardiology Office Note:    Date:  01/16/2020   ID:  Teresa SMYSER, DOB 1951/01/16, MRN [id number]  PCP:  Martinique, [name], MD  Cardiologist:  Shirlee More, MD   Referring MD: Martinique, [name], MD  ASSESSMENT:    No diagnosis found. PLAN:    In order of problems listed above:  1. ***  Next appointment   Medication Adjustments/Labs and Tests Ordered: Current medicines are reviewed at length with the patient today.  Concerns regarding medicines are outlined above.  No orders of the defined types were placed in this encounter.  No orders of the defined types were placed in this encounter.    No chief complaint on file. ***  History of Present Illness:    Teresa Murphy is a 69 y.o. female who is being seen today for evaluation at the request of Martinique, [name], MD. Operative EKG performed 01/07/2020 interpreted as abnormal.  Previous EKG 11/26/2017 independently reviewed and was normal.  [facility] pulmonary for asthma. She is scheduled for total knee arthroplasty, right lower extremity 01/18/2020. Her cardiovascular history is noteworthy for hypertension.  Chart review shows no cardiology records or cardiovascular evaluation.  Revised cardiac surgical risk score is 0, class I. Past Medical History:  Diagnosis Date  . Allergic rhinitis   . Anxiety   . Anxiety disorder 07/10/2015  . Arthritis   . Asthma   . Asthma with bronchitis 04/19/2010   PFT 09/18/2010-FEV1  1.95/92; FEV1/FVC 0.76; FEF 25-75% improved 48% with bronchodilator; normal lung volumes; DLCO 78%    . CAP (community acquired pneumonia) 03/20/2016  . Depression   . Essential hypertension 04/21/2010   Qualifier: Diagnosis of  By: Annamaria Boots MD, Clinton D   . History of kidney stones   . Hyperlipidemia 11/25/2017  . Hypertension   . Osteoarthritis of both knees 07/10/2015  . Pneumonia    hx of   . Seasonal and perennial allergic rhinitis 04/19/2010   Allergy Profile 04/19/2010-negative, IgE  3.8 Allergy skin  test 11/07/2010-positive for grass, weed, tree pollens, dust mite   . Severe obesity (BMI 35.0-39.9) with comorbidity ([place]) 01/11/2016  . Vitamin D deficiency 07/10/2015    Past Surgical History:  Procedure Laterality Date  . CHOLECYSTECTOMY    . TONSILLECTOMY    . TOTAL ABDOMINAL HYSTERECTOMY      Current Medications: No outpatient medications have been marked as taking for the 01/17/20 encounter (Appointment) with Richardo Priest, MD.     Allergies:   Patient has no known allergies.   Social History   Socioeconomic History  . Marital status: Married    Spouse name: Teresa Murphy  . Number of children: 0  . Years of education: College  . Highest education level: Not on file  Occupational History  . Occupation: retired  Tobacco Use  . Smoking status: Never Smoker  . Smokeless tobacco: Never Used  Vaping Use  . Vaping Use: Never used  Substance and Sexual Activity  . Alcohol use: No    Alcohol/week: 0.0 standard drinks  . Drug use: No  . Sexual activity: Yes    Birth control/protection: Surgical  Other Topics Concern  . Not on file  Social History Narrative   Patient lives at home with spouse.   Caffeine Use:    Social Determinants of Health   Financial Resource Strain: Not on file  Food Insecurity: Not on file  Transportation Needs: Not on file  Physical Activity: Not on file  Stress: Not on file  Social Connections: Not on file     Family History: The patient's ***family history includes Allergic rhinitis in an other family member; Asthma in her sister; Cancer in an other family member; Dementia in her mother; Heart disease in an other family member; Lung cancer in her father; Multiple myeloma in her mother; Sleep apnea in her sister.  ROS:   ROS Please see the history of present illness.    *** All other systems reviewed and are negative.  EKGs/Labs/Other Studies Reviewed:    The following studies were reviewed today: ***  EKG:  EKG is *** ordered today.   The ekg ordered today is personally reviewed and demonstrates ***  Recent Labs: 12/21/2019: ALT 16 01/11/2020: BUN 22; Creatinine, Ser 0.65; Hemoglobin 14.0; Platelets 239; Potassium 4.6; Sodium 139  Recent Lipid Panel    Component Value Date/Time   CHOL 222 (H) 12/21/2019 1130   TRIG 248 (H) 12/21/2019 1130   HDL 42 (L) 12/21/2019 1130   CHOLHDL 5.3 (H) 12/21/2019 1130   VLDL 32.6 01/11/2016 0927   LDLCALC 142 (H) 12/21/2019 1130    Physical Exam:    VS:  There were no vitals taken for this visit.    Wt Readings from Last 3 Encounters:  01/11/20 213 lb 1 oz (96.6 kg)  12/21/19 217 lb 12.8 oz (98.8 kg)  08/18/19 221 lb 6.4 oz (100.4 kg)     GEN: *** Well nourished, well developed in no acute distress HEENT: Normal NECK: No JVD; No carotid bruits LYMPHATICS: No lymphadenopathy CARDIAC: ***RRR, no murmurs, rubs, gallops RESPIRATORY:  Clear to auscultation without rales, wheezing or rhonchi  ABDOMEN: Soft, non-tender, non-distended MUSCULOSKELETAL:  No edema; No deformity  SKIN: Warm and dry NEUROLOGIC:  Alert and oriented x 3 PSYCHIATRIC:  Normal affect     Signed, Shirlee More, MD  01/16/2020 1:38 PM    [facility]

## 2020-01-16 NOTE — H&P (Signed)
TOTAL KNEE ADMISSION H&P  Patient is being admitted for right total knee arthroplasty.  Subjective:  Chief Complaint:  Right knee OA / pain  HPI: Teresa Murphy, 69 y.o. female, has a history of pain and functional disability in the right knee due to arthritis and has failed non-surgical conservative treatments for greater than 12 weeks to include NSAID's and/or analgesics, corticosteriod injections, viscosupplementation injections and activity modification.  Onset of symptoms was gradual, starting 5+ years ago with gradually worsening course since that time. The patient noted no past surgery on the right knee(s).  Patient currently rates pain in the right knee(s) at 10 out of 10 with activity. Patient has night pain, worsening of pain with activity and weight bearing, pain that interferes with activities of daily living, pain with passive range of motion, crepitus and joint swelling.  Patient has evidence of periarticular osteophytes and joint space narrowing by imaging studies.  There is no active infection.  Risks, benefits and expectations were discussed with the patient.  Risks including but not limited to the risk of anesthesia, blood clots, nerve damage, blood vessel damage, failure of the prosthesis, infection and up to and including death.  Patient understand the risks, benefits and expectations and wishes to proceed with surgery.   D/C Plans:       Home   Post-op Meds:       No Rx given   Tranexamic Acid:      To be given - IV   Decadron:      Is to be given  FYI:      ASA  [place]  DME:   Pt equipment arranged  PT:   [place]: [place], [place]   Patient Active Problem List   Diagnosis Date Noted  . Pneumonia   . Hypertension   . History of kidney stones   . Depression   . Arthritis   . Anxiety   . Hyperlipidemia 11/25/2017  . CAP (community acquired pneumonia) 03/20/2016  . Severe obesity (BMI 35.0-39.9) with comorbidity ([place]) 01/11/2016  .  Vitamin D deficiency 07/10/2015  . Anxiety disorder 07/10/2015  . Osteoarthritis of both knees 07/10/2015  . Essential hypertension 04/21/2010  . Seasonal and perennial allergic rhinitis 04/19/2010  . Asthma with bronchitis 04/19/2010   Past Medical History:  Diagnosis Date  . Allergic rhinitis   . Anxiety   . Anxiety disorder 07/10/2015  . Arthritis   . Asthma   . Asthma with bronchitis 04/19/2010   PFT 09/18/2010-FEV1  1.95/92; FEV1/FVC 0.76; FEF 25-75% improved 48% with bronchodilator; normal lung volumes; DLCO 78%    . CAP (community acquired pneumonia) 03/20/2016  . Depression   . Essential hypertension 04/21/2010   Qualifier: Diagnosis of  By: Annamaria Boots MD, Clinton D   . History of kidney stones   . Hyperlipidemia 11/25/2017  . Hypertension   . Osteoarthritis of both knees 07/10/2015  . Pneumonia    hx of   . Seasonal and perennial allergic rhinitis 04/19/2010   Allergy Profile 04/19/2010-negative, IgE  3.8 Allergy skin test 11/07/2010-positive for grass, weed, tree pollens, dust mite   . Severe obesity (BMI 35.0-39.9) with comorbidity ([place]) 01/11/2016  . Vitamin D deficiency 07/10/2015    Past Surgical History:  Procedure Laterality Date  . CHOLECYSTECTOMY    . TONSILLECTOMY    . TOTAL ABDOMINAL HYSTERECTOMY      No current facility-administered medications for this encounter.   Current Outpatient Medications  Medication Sig Dispense  Refill Last Dose  . albuterol (PROAIR HFA) 108 (90 Base) MCG/ACT inhaler Inhale 2 puffs into the lungs every 4 (four) hours as needed. (Patient taking differently: Inhale 2 puffs into the lungs every 4 (four) hours as needed for wheezing or shortness of breath. ) 1 Inhaler 3   . cetirizine (ZYRTEC) 10 MG tablet Take 10 mg by mouth at bedtime.      . Cinnamon 500 MG capsule Take 500 mg by mouth daily.     . diazepam (VALIUM) 5 MG tablet Take 1 tablet (5 mg total) by mouth daily as needed for anxiety. 30 tablet 1   . DULoxetine (CYMBALTA) 60 MG capsule  Take 1 capsule (60 mg total) by mouth daily. 90 capsule 2   . Fluticasone-Salmeterol (ADVAIR DISKUS) 250-50 MCG/DOSE AEPB INHALE 1 PUFF INTO LUNGS TWICE A DAY RINSE AFTER USE (Patient taking differently: Inhale 1 puff into the lungs daily as needed (asthma). ) 3 each 0   . Glucos-Chond-Hyal Ac-Ca Fructo (MOVE FREE JOINT HEALTH ADVANCE PO) Take 1 tablet by mouth daily.     Marland Kitchen latanoprost (XALATAN) 0.005 % ophthalmic solution Place 1 drop into both eyes at bedtime.  11   . montelukast (SINGULAIR) 10 MG tablet TAKE 1 TABLET BY MOUTH EVERYDAY AT BEDTIME (Patient taking differently: Take 10 mg by mouth at bedtime. TAKE 1 TABLET BY MOUTH EVERYDAY AT BEDTIME) 90 tablet 3   . Multiple Vitamins-Minerals (MULTIVITAMIN WOMEN 50+) TABS Take 1 tablet by mouth daily.     . naproxen sodium (ANAPROX) 220 MG tablet Take 440 mg by mouth 2 (two) times daily with a meal.      . olmesartan (BENICAR) 20 MG tablet Take 1 tablet (20 mg total) by mouth daily. 90 tablet 2   . vitamin B-12 (CYANOCOBALAMIN) 500 MCG tablet Take 500 mcg by mouth daily.     . Vitamin D, Ergocalciferol, (DRISDOL) 1.25 MG (50000 UNIT) CAPS capsule TAKE ONE CAPSULE BY MOUTH EVERY 5 DAYS (Patient taking differently: Take 50,000 Units by mouth See admin instructions. TAKE ONE CAPSULE BY MOUTH EVERY 10 DAYS) 18 capsule 2    No Known Allergies  Social History   Tobacco Use  . Smoking status: Never Smoker  . Smokeless tobacco: Never Used  Substance Use Topics  . Alcohol use: No    Alcohol/week: 0.0 standard drinks    Family History  Problem Relation Age of Onset  . Dementia Mother   . Multiple myeloma Mother   . Lung cancer Father   . Asthma Sister   . Sleep apnea Sister   . Heart disease Other   . Allergic rhinitis Other   . Cancer Other      Review of Systems  Constitutional: Negative.   HENT: Negative.   Eyes: Negative.   Respiratory: Negative.   Cardiovascular: Negative.   Gastrointestinal: Negative.   Genitourinary: Negative.    Musculoskeletal: Positive for joint pain.  Skin: Negative.   Neurological: Negative.   Endo/Heme/Allergies: Positive for environmental allergies.  Psychiatric/Behavioral: Positive for depression. The patient is nervous/anxious.      Objective:  Physical Exam Constitutional:      Appearance: She is well-developed.  HENT:     Head: Normocephalic.  Eyes:     Pupils: Pupils are equal, round, and reactive to light.  Neck:     Thyroid: No thyromegaly.     Vascular: No JVD.     Trachea: No tracheal deviation.  Cardiovascular:     Rate and Rhythm: Normal  rate and regular rhythm.     Pulses: Intact distal pulses.  Pulmonary:     Effort: Pulmonary effort is normal. No respiratory distress.     Breath sounds: Normal breath sounds. No wheezing.  Abdominal:     Palpations: Abdomen is soft.     Tenderness: There is no abdominal tenderness. There is no guarding.  Musculoskeletal:     Cervical back: Neck supple.     Right knee: Swelling and bony tenderness present. No erythema or ecchymosis. Decreased range of motion. Tenderness present.  Lymphadenopathy:     Cervical: No cervical adenopathy.  Skin:    General: Skin is warm and dry.  Neurological:     Mental Status: She is alert and oriented to person, place, and time.  Psychiatric:        Mood and Affect: Mood and affect normal.       Labs:  Estimated body mass index is 37.74 kg/m as calculated from the following:   Height as of 01/11/20: $RemoveBef'5\' 3"'HSBgZePwHa$  (1.6 m).   Weight as of 01/11/20: 96.6 kg.   Imaging Review Plain radiographs demonstrate severe degenerative joint disease of the right knee.  The bone quality appears to be good for age and reported activity level.      Assessment/Plan:  End stage arthritis, right knee   The patient history, physical examination, clinical judgment of the provider and imaging studies are consistent with end stage degenerative joint disease of the right knee(s) and total knee arthroplasty is  deemed medically necessary. The treatment options including medical management, injection therapy arthroscopy and arthroplasty were discussed at length. The risks and benefits of total knee arthroplasty were presented and reviewed. The risks due to aseptic loosening, infection, stiffness, patella tracking problems, thromboembolic complications and other imponderables were discussed. The patient acknowledged the explanation, agreed to proceed with the plan and consent was signed. Patient is being admitted for treatment for surgery, pain control, PT, OT, prophylactic antibiotics, VTE prophylaxis, progressive ambulation and ADL's and discharge planning. The patient is planning to be discharged home.    Patient's anticipated LOS is less than 2 midnights, meeting these requirements: - Lives within 1 hour of care - Has a competent adult at home to recover with post-op recover - NO history of  - Chronic pain requiring opiods  - Diabetes  - Coronary Artery Disease  - Heart failure  - Heart attack  - Stroke  - DVT/VTE  - Cardiac arrhythmia  - Respiratory Failure/COPD  - Renal failure  - Anemia  - Advanced Liver disease

## 2020-01-17 ENCOUNTER — Encounter: Payer: Self-pay | Admitting: Cardiology

## 2020-01-17 ENCOUNTER — Ambulatory Visit (INDEPENDENT_AMBULATORY_CARE_PROVIDER_SITE_OTHER): Payer: Medicare Other | Admitting: Cardiology

## 2020-01-17 ENCOUNTER — Telehealth: Payer: Self-pay | Admitting: Family Medicine

## 2020-01-17 ENCOUNTER — Ambulatory Visit: Payer: Medicare Other | Admitting: Cardiology

## 2020-01-17 ENCOUNTER — Other Ambulatory Visit: Payer: Self-pay

## 2020-01-17 VITALS — BP 130/84 | HR 90 | Ht 63.0 in | Wt 211.1 lb

## 2020-01-17 DIAGNOSIS — I10 Essential (primary) hypertension: Secondary | ICD-10-CM

## 2020-01-17 DIAGNOSIS — Z01810 Encounter for preprocedural cardiovascular examination: Secondary | ICD-10-CM | POA: Diagnosis not present

## 2020-01-17 DIAGNOSIS — R9431 Abnormal electrocardiogram [ECG] [EKG]: Secondary | ICD-10-CM

## 2020-01-17 NOTE — Progress Notes (Signed)
Cardiology Office Note:    Date:  01/17/2020   ID:  Teresa Murphy, DOB 1950/11/10, MRN [id number]  PCP:  [place], [name], MD  Cardiologist:  Norman Herrlich, MD   Referring MD: [place], [name], MD  ASSESSMENT:    1. Nonspecific abnormal electrocardiogram (ECG) (EKG)   2. Essential hypertension   3. Primary hypertension   4. Preoperative cardiovascular examination    PLAN:    In order of problems listed above:  1. EKG pattern is a variant of normal and stable there is no clinical suspicion of underlying heart disease in my opinion she is optimized for planned surgical procedure does not require further preoperative cardiac evaluation 2. Stable BP at target continue current treatment ARB  Next appointment as needed   Medication Adjustments/Labs and Tests Ordered: Current medicines are reviewed at length with the patient today.  Concerns regarding medicines are outlined above.  Orders Placed This Encounter  Procedures  . EKG 12-Lead   No orders of the defined types were placed in this encounter.    Chief Complaint  Patient presents with  . Pre-op Exam    History of Present Illness:    Teresa Murphy is a 69 y.o. female who is being seen today for evaluation at the request of [place], [name], MD. Operative EKG performed 01/07/2020 interpreted as abnormal.  Previous EKG 11/26/2017 independently reviewed and was normal.  [facility] pulmonary for asthma. She is scheduled for total knee arthroplasty, right lower extremity 01/18/2020. Her cardiovascular history is noteworthy for hypertension.  Chart review shows no cardiology records or cardiovascular evaluation.   Revised cardiac surgical risk score is 0, class I.  She has no known history of heart disease congenital rheumatic or atrial fibrillation. Preoperative screening EKG shows what was interpreted as possible anterior MI but it is a classic pattern of poor R wave progression is seen on the office EKG today.  Her  planned surgery is elective intermediate risk and she has no known history of heart disease Her exercise tolerance despite knee pain is normal greater than 7 METS. In my opinion she is optimized for planned surgical procedure tomorrow please continue her antihypertensive agent. I do not think she needs any particular postoperative cardiology testing.  Past Medical History:  Diagnosis Date  . Allergic rhinitis   . Anxiety   . Anxiety disorder 07/10/2015  . Arthritis   . Asthma   . Asthma with bronchitis 04/19/2010   PFT 09/18/2010-FEV1  1.95/92; FEV1/FVC 0.76; FEF 25-75% improved 48% with bronchodilator; normal lung volumes; DLCO 78%    . CAP (community acquired pneumonia) 03/20/2016  . Depression   . Essential hypertension 04/21/2010   Qualifier: Diagnosis of  By: Maple Hudson MD, Clinton D   . History of kidney stones   . Hyperlipidemia 11/25/2017  . Hypertension   . Osteoarthritis of both knees 07/10/2015  . Pneumonia    hx of   . Seasonal and perennial allergic rhinitis 04/19/2010   Allergy Profile 04/19/2010-negative, IgE  3.8 Allergy skin test 11/07/2010-positive for grass, weed, tree pollens, dust mite   . Severe obesity (BMI 35.0-39.9) with comorbidity ([facility]) 01/11/2016  . Vitamin D deficiency 07/10/2015    Past Surgical History:  Procedure Laterality Date  . CHOLECYSTECTOMY    . TONSILLECTOMY    . TOTAL ABDOMINAL HYSTERECTOMY      Current Medications: Current Meds  Medication Sig  . albuterol (PROAIR HFA) 108 (90 Base) MCG/ACT inhaler Inhale 2 puffs into the lungs every  4 (four) hours as needed. (Patient taking differently: Inhale 2 puffs into the lungs every 4 (four) hours as needed for wheezing or shortness of breath.)  . cetirizine (ZYRTEC) 10 MG tablet Take 10 mg by mouth at bedtime.   . Cinnamon 500 MG capsule Take 500 mg by mouth daily.  . diazepam (VALIUM) 5 MG tablet Take 1 tablet (5 mg total) by mouth daily as needed for anxiety.  . DULoxetine (CYMBALTA) 60 MG capsule Take 1  capsule (60 mg total) by mouth daily.  . Fluticasone-Salmeterol (ADVAIR DISKUS) 250-50 MCG/DOSE AEPB INHALE 1 PUFF INTO LUNGS TWICE A DAY RINSE AFTER USE (Patient taking differently: Inhale 1 puff into the lungs daily as needed (asthma).)  . Glucos-Chond-Hyal Ac-Ca Fructo (MOVE FREE JOINT HEALTH ADVANCE PO) Take 1 tablet by mouth daily.  Marland Kitchen latanoprost (XALATAN) 0.005 % ophthalmic solution Place 1 drop into both eyes at bedtime.  . montelukast (SINGULAIR) 10 MG tablet TAKE 1 TABLET BY MOUTH EVERYDAY AT BEDTIME (Patient taking differently: Take 10 mg by mouth at bedtime. TAKE 1 TABLET BY MOUTH EVERYDAY AT BEDTIME)  . Multiple Vitamins-Minerals (MULTIVITAMIN WOMEN 50+) TABS Take 1 tablet by mouth daily.  . naproxen sodium (ANAPROX) 220 MG tablet Take 440 mg by mouth 2 (two) times daily with a meal.   . olmesartan (BENICAR) 20 MG tablet Take 1 tablet (20 mg total) by mouth daily.  . vitamin B-12 (CYANOCOBALAMIN) 500 MCG tablet Take 500 mcg by mouth daily.  . Vitamin D, Ergocalciferol, (DRISDOL) 1.25 MG (50000 UNIT) CAPS capsule TAKE ONE CAPSULE BY MOUTH EVERY 5 DAYS (Patient taking differently: Take 50,000 Units by mouth See admin instructions. TAKE ONE CAPSULE BY MOUTH EVERY 10 DAYS)     Allergies:   Patient has no known allergies.   Social History   Socioeconomic History  . Marital status: Married    Spouse name: [name]  . Number of children: 0  . Years of education: College  . Highest education level: Not on file  Occupational History  . Occupation: retired  Tobacco Use  . Smoking status: Never Smoker  . Smokeless tobacco: Never Used  Vaping Use  . Vaping Use: Never used  Substance and Sexual Activity  . Alcohol use: No    Alcohol/week: 0.0 standard drinks  . Drug use: No  . Sexual activity: Yes    Birth control/protection: Surgical  Other Topics Concern  . Not on file  Social History Narrative   Patient lives at home with spouse.   Caffeine Use:    Social Determinants of  Radio broadcast assistant Strain: Not on file  Food Insecurity: Not on file  Transportation Needs: Not on file  Physical Activity: Not on file  Stress: Not on file  Social Connections: Not on file     Family History: The patient's family history includes Allergic rhinitis in an other family member; Asthma in her sister; Cancer in an other family member; Dementia in her mother; Heart disease in an other family member; Lung cancer in her father; Multiple myeloma in her mother; Sleep apnea in her sister.  ROS:   ROS Please see the history of present illness.     All other systems reviewed and are negative.  EKGs/Labs/Other Studies Reviewed:    The following studies were reviewed today:   EKG:  EKG is  ordered today.  The ekg ordered today is personally reviewed and demonstrates sinus rhythm poor R wave progression  Recent Labs: 12/21/2019: ALT 16 01/11/2020: BUN  22; Creatinine, Ser 0.65; Hemoglobin 14.0; Platelets 239; Potassium 4.6; Sodium 139  Recent Lipid Panel    Component Value Date/Time   CHOL 222 (H) 12/21/2019 1130   TRIG 248 (H) 12/21/2019 1130   HDL 42 (L) 12/21/2019 1130   CHOLHDL 5.3 (H) 12/21/2019 1130   VLDL 32.6 01/11/2016 0927   LDLCALC 142 (H) 12/21/2019 1130    Physical Exam:    VS:  BP 130/84 (BP Location: Right Arm, Patient Position: Sitting, Cuff Size: Normal)   Pulse 90   Ht $R'5\' 3"'Er$  (1.6 m)   Wt 211 lb 1.9 oz (95.8 kg)   SpO2 96%   BMI 37.40 kg/m     Wt Readings from Last 3 Encounters:  01/17/20 211 lb 1.9 oz (95.8 kg)  01/11/20 213 lb 1 oz (96.6 kg)  12/21/19 217 lb 12.8 oz (98.8 kg)     GEN:  Well nourished, well developed in no acute distress HEENT: Normal NECK: No JVD; No carotid bruits LYMPHATICS: No lymphadenopathy CARDIAC: Normal exam RRR, no murmurs, rubs, gallops RESPIRATORY:  Clear to auscultation without rales, wheezing or rhonchi  ABDOMEN: Soft, non-tender, non-distended MUSCULOSKELETAL:  No edema; No deformity  SKIN: Warm  and dry NEUROLOGIC:  Alert and oriented x 3 PSYCHIATRIC:  Normal affect     Signed, Shirlee More, MD  01/17/2020 3:33 PM     [facility]

## 2020-01-17 NOTE — Telephone Encounter (Signed)
EKG faxed 

## 2020-01-17 NOTE — Telephone Encounter (Signed)
Teresa Murphy with [facility] would like to have the last EKG sent to fax # [phone].

## 2020-01-17 NOTE — Patient Instructions (Addendum)

## 2020-01-18 ENCOUNTER — Encounter (HOSPITAL_COMMUNITY): Payer: Self-pay | Admitting: Orthopedic Surgery

## 2020-01-18 ENCOUNTER — Observation Stay (HOSPITAL_COMMUNITY)
Admission: RE | Admit: 2020-01-18 | Discharge: 2020-01-19 | Disposition: A | Discharge status: Home / Self Care | Payer: Medicare Other | Attending: Orthopedic Surgery | Admitting: Orthopedic Surgery

## 2020-01-18 ENCOUNTER — Ambulatory Visit (HOSPITAL_COMMUNITY): Payer: Medicare Other | Admitting: Anesthesiology

## 2020-01-18 ENCOUNTER — Encounter (HOSPITAL_COMMUNITY)
Admission: RE | Disposition: A | Discharge status: Home / Self Care | Payer: Self-pay | Source: Home / Self Care | Attending: Orthopedic Surgery

## 2020-01-18 ENCOUNTER — Ambulatory Visit (HOSPITAL_COMMUNITY): Payer: Medicare Other | Admitting: Physician Assistant

## 2020-01-18 DIAGNOSIS — I10 Essential (primary) hypertension: Secondary | ICD-10-CM | POA: Diagnosis not present

## 2020-01-18 DIAGNOSIS — E559 Vitamin D deficiency, unspecified: Secondary | ICD-10-CM | POA: Diagnosis not present

## 2020-01-18 DIAGNOSIS — J45909 Unspecified asthma, uncomplicated: Secondary | ICD-10-CM | POA: Diagnosis not present

## 2020-01-18 DIAGNOSIS — M1711 Unilateral primary osteoarthritis, right knee: Secondary | ICD-10-CM | POA: Diagnosis not present

## 2020-01-18 DIAGNOSIS — Z79899 Other long term (current) drug therapy: Secondary | ICD-10-CM | POA: Insufficient documentation

## 2020-01-18 DIAGNOSIS — Z96651 Presence of right artificial knee joint: Secondary | ICD-10-CM

## 2020-01-18 DIAGNOSIS — M25561 Pain in right knee: Secondary | ICD-10-CM | POA: Diagnosis present

## 2020-01-18 DIAGNOSIS — E669 Obesity, unspecified: Secondary | ICD-10-CM | POA: Diagnosis present

## 2020-01-18 DIAGNOSIS — G8918 Other acute postprocedural pain: Secondary | ICD-10-CM | POA: Diagnosis not present

## 2020-01-18 HISTORY — PX: TOTAL KNEE ARTHROPLASTY: SHX125

## 2020-01-18 LAB — ABO/RH: ABO/RH(D): O POS

## 2020-01-18 LAB — TYPE AND SCREEN
ABO/RH(D): O POS
Antibody Screen: NEGATIVE

## 2020-01-18 SURGERY — ARTHROPLASTY, KNEE, TOTAL
Anesthesia: Regional | Site: Knee | Laterality: Right

## 2020-01-18 MED ORDER — DEXAMETHASONE SODIUM PHOSPHATE 10 MG/ML IJ SOLN
10.0000 mg | Freq: Once | INTRAMUSCULAR | Status: AC
  Administered 2020-01-19: 10:00:00 10 mg via INTRAVENOUS
  Filled 2020-01-18: qty 1

## 2020-01-18 MED ORDER — CEFAZOLIN SODIUM-DEXTROSE 2-4 GM/100ML-% IV SOLN
2.0000 g | Freq: Four times a day (QID) | INTRAVENOUS | Status: AC
  Administered 2020-01-18 – 2020-01-19 (×2): 2 g via INTRAVENOUS
  Filled 2020-01-18 (×2): qty 100

## 2020-01-18 MED ORDER — HYDROMORPHONE HCL 1 MG/ML IJ SOLN: 0.5000 mg | INTRAMUSCULAR | Status: DC | PRN

## 2020-01-18 MED ORDER — SUCCINYLCHOLINE CHLORIDE 20 MG/ML IJ SOLN
INTRAMUSCULAR | Status: DC | PRN
  Administered 2020-01-18: 80 mg via INTRAVENOUS

## 2020-01-18 MED ORDER — LATANOPROST 0.005 % OP SOLN
1.0000 [drp] | Freq: Every day | OPHTHALMIC | Status: DC
  Administered 2020-01-18: 21:00:00 1 [drp] via OPHTHALMIC
  Filled 2020-01-18: qty 2.5

## 2020-01-18 MED ORDER — BUPIVACAINE-EPINEPHRINE (PF) 0.5% -1:200000 IJ SOLN
INTRAMUSCULAR | Status: DC | PRN
  Administered 2020-01-18: 30 mL via PERINEURAL

## 2020-01-18 MED ORDER — FENTANYL CITRATE (PF) 100 MCG/2ML IJ SOLN
INTRAMUSCULAR | Status: AC
  Administered 2020-01-18: 16:00:00 25 ug via INTRAVENOUS
  Filled 2020-01-18: qty 2

## 2020-01-18 MED ORDER — HYDROCODONE-ACETAMINOPHEN 7.5-325 MG PO TABS
1.0000 | ORAL_TABLET | ORAL | Status: DC | PRN
  Administered 2020-01-18 – 2020-01-19 (×2): 2 via ORAL
  Filled 2020-01-18 (×2): qty 2

## 2020-01-18 MED ORDER — SUGAMMADEX SODIUM 200 MG/2ML IV SOLN
INTRAVENOUS | Status: DC | PRN
  Administered 2020-01-18: 200 mg via INTRAVENOUS

## 2020-01-18 MED ORDER — SODIUM CHLORIDE 0.9 % IV SOLN: INTRAVENOUS | Status: DC

## 2020-01-18 MED ORDER — ONDANSETRON HCL 4 MG PO TABS: 4.0000 mg | ORAL_TABLET | Freq: Four times a day (QID) | ORAL | Status: DC | PRN

## 2020-01-18 MED ORDER — ONDANSETRON HCL 4 MG/2ML IJ SOLN: 4.0000 mg | Freq: Four times a day (QID) | INTRAMUSCULAR | Status: DC | PRN

## 2020-01-18 MED ORDER — FENTANYL CITRATE (PF) 100 MCG/2ML IJ SOLN
50.0000 ug | Freq: Once | INTRAMUSCULAR | Status: AC
  Filled 2020-01-18: qty 2

## 2020-01-18 MED ORDER — DULOXETINE HCL 60 MG PO CPEP
60.0000 mg | ORAL_CAPSULE | Freq: Every day | ORAL | Status: DC
  Administered 2020-01-18 – 2020-01-19 (×2): 60 mg via ORAL
  Filled 2020-01-18 (×2): qty 1

## 2020-01-18 MED ORDER — SODIUM CHLORIDE 0.9 % IR SOLN
Status: DC | PRN
Start: 2020-01-18 — End: 2020-01-18
  Administered 2020-01-18: 1000 mL

## 2020-01-18 MED ORDER — STERILE WATER FOR IRRIGATION IR SOLN
Status: DC | PRN
  Administered 2020-01-18: 2000 mL

## 2020-01-18 MED ORDER — IRBESARTAN 150 MG PO TABS
150.0000 mg | ORAL_TABLET | Freq: Every day | ORAL | Status: DC
  Administered 2020-01-18 – 2020-01-19 (×2): 150 mg via ORAL
  Filled 2020-01-18 (×2): qty 1

## 2020-01-18 MED ORDER — PROPOFOL 10 MG/ML IV BOLUS
INTRAVENOUS | Status: DC | PRN
Start: 2020-01-18 — End: 2020-01-18
  Administered 2020-01-18: 20 mg via INTRAVENOUS
  Administered 2020-01-18: 180 mg via INTRAVENOUS

## 2020-01-18 MED ORDER — CELECOXIB 200 MG PO CAPS
200.0000 mg | ORAL_CAPSULE | Freq: Two times a day (BID) | ORAL | Status: DC
  Administered 2020-01-18 – 2020-01-19 (×2): 200 mg via ORAL
  Filled 2020-01-18 (×2): qty 1

## 2020-01-18 MED ORDER — SUCCINYLCHOLINE CHLORIDE 200 MG/10ML IV SOSY
PREFILLED_SYRINGE | INTRAVENOUS | Status: AC
  Filled 2020-01-18: qty 10

## 2020-01-18 MED ORDER — MAGNESIUM CITRATE PO SOLN: 1.0000 | Freq: Once | ORAL | Status: DC | PRN

## 2020-01-18 MED ORDER — POVIDONE-IODINE 10 % EX SWAB
2.0000 "application " | Freq: Once | CUTANEOUS | Status: AC
  Administered 2020-01-18: 2 via TOPICAL

## 2020-01-18 MED ORDER — BISACODYL 10 MG RE SUPP: 10.0000 mg | Freq: Every day | RECTAL | Status: DC | PRN

## 2020-01-18 MED ORDER — MONTELUKAST SODIUM 10 MG PO TABS
10.0000 mg | ORAL_TABLET | Freq: Every day | ORAL | Status: DC
  Administered 2020-01-18: 21:00:00 10 mg via ORAL
  Filled 2020-01-18: qty 1

## 2020-01-18 MED ORDER — ACETAMINOPHEN 10 MG/ML IV SOLN: 1000.0000 mg | Freq: Once | INTRAVENOUS | Status: DC | PRN

## 2020-01-18 MED ORDER — LACTATED RINGERS IV SOLN: INTRAVENOUS | Status: DC

## 2020-01-18 MED ORDER — CHLORHEXIDINE GLUCONATE 0.12 % MT SOLN
15.0000 mL | Freq: Once | OROMUCOSAL | Status: AC
  Administered 2020-01-18: 12:00:00 15 mL via OROMUCOSAL

## 2020-01-18 MED ORDER — DEXAMETHASONE SODIUM PHOSPHATE 10 MG/ML IJ SOLN
INTRAMUSCULAR | Status: DC | PRN
  Administered 2020-01-18: 8 mg via INTRAVENOUS

## 2020-01-18 MED ORDER — ONDANSETRON HCL 4 MG/2ML IJ SOLN
INTRAMUSCULAR | Status: DC | PRN
  Administered 2020-01-18: 4 mg via INTRAVENOUS

## 2020-01-18 MED ORDER — MENTHOL 3 MG MT LOZG: 1.0000 | LOZENGE | OROMUCOSAL | Status: DC | PRN

## 2020-01-18 MED ORDER — ACETAMINOPHEN 325 MG PO TABS: 325.0000 mg | ORAL_TABLET | Freq: Four times a day (QID) | ORAL | Status: DC | PRN

## 2020-01-18 MED ORDER — METHOCARBAMOL 500 MG IVPB - SIMPLE MED
INTRAVENOUS | Status: AC
  Filled 2020-01-18: qty 50

## 2020-01-18 MED ORDER — EPHEDRINE 5 MG/ML INJ
INTRAVENOUS | Status: AC
  Filled 2020-01-18: qty 10

## 2020-01-18 MED ORDER — ROCURONIUM BROMIDE 100 MG/10ML IV SOLN
INTRAVENOUS | Status: DC | PRN
  Administered 2020-01-18: 40 mg via INTRAVENOUS

## 2020-01-18 MED ORDER — ALBUTEROL SULFATE HFA 108 (90 BASE) MCG/ACT IN AERS: 2.0000 | INHALATION_SPRAY | RESPIRATORY_TRACT | Status: DC | PRN

## 2020-01-18 MED ORDER — FENTANYL CITRATE (PF) 100 MCG/2ML IJ SOLN
INTRAMUSCULAR | Status: AC
  Filled 2020-01-18: qty 2

## 2020-01-18 MED ORDER — BUPIVACAINE-EPINEPHRINE (PF) 0.25% -1:200000 IJ SOLN
INTRAMUSCULAR | Status: AC
  Filled 2020-01-18: qty 30

## 2020-01-18 MED ORDER — PHENYLEPHRINE HCL-NACL 10-0.9 MG/250ML-% IV SOLN
INTRAVENOUS | Status: DC | PRN
  Administered 2020-01-18: 25 ug/min via INTRAVENOUS

## 2020-01-18 MED ORDER — METOCLOPRAMIDE HCL 5 MG PO TABS: 5.0000 mg | ORAL_TABLET | Freq: Three times a day (TID) | ORAL | Status: DC | PRN

## 2020-01-18 MED ORDER — SODIUM CHLORIDE (PF) 0.9 % IJ SOLN
INTRAMUSCULAR | Status: AC
  Filled 2020-01-18: qty 50

## 2020-01-18 MED ORDER — CEFAZOLIN SODIUM-DEXTROSE 2-4 GM/100ML-% IV SOLN
2.0000 g | INTRAVENOUS | Status: AC
  Administered 2020-01-18: 14:00:00 2 g via INTRAVENOUS
  Filled 2020-01-18: qty 100

## 2020-01-18 MED ORDER — ALUM & MAG HYDROXIDE-SIMETH 200-200-20 MG/5ML PO SUSP: 15.0000 mL | ORAL | Status: DC | PRN

## 2020-01-18 MED ORDER — HYDROCODONE-ACETAMINOPHEN 5-325 MG PO TABS
1.0000 | ORAL_TABLET | ORAL | Status: DC | PRN
  Administered 2020-01-18 – 2020-01-19 (×2): 2 via ORAL
  Filled 2020-01-18 (×2): qty 2

## 2020-01-18 MED ORDER — FERROUS SULFATE 325 (65 FE) MG PO TABS
325.0000 mg | ORAL_TABLET | Freq: Two times a day (BID) | ORAL | Status: DC
  Administered 2020-01-19: 10:00:00 325 mg via ORAL
  Filled 2020-01-18: qty 1

## 2020-01-18 MED ORDER — SODIUM CHLORIDE 0.9 % IR SOLN
Status: DC | PRN
  Administered 2020-01-18: 1000 mL

## 2020-01-18 MED ORDER — BUPIVACAINE-EPINEPHRINE (PF) 0.25% -1:200000 IJ SOLN
INTRAMUSCULAR | Status: DC | PRN
  Administered 2020-01-18: 30 mL

## 2020-01-18 MED ORDER — LORATADINE 10 MG PO TABS
10.0000 mg | ORAL_TABLET | Freq: Every day | ORAL | Status: DC
  Administered 2020-01-18: 21:00:00 10 mg via ORAL
  Filled 2020-01-18: qty 1

## 2020-01-18 MED ORDER — FENTANYL CITRATE (PF) 100 MCG/2ML IJ SOLN
INTRAMUSCULAR | Status: DC | PRN
  Administered 2020-01-18: 50 ug via INTRAVENOUS
  Administered 2020-01-18: 100 ug via INTRAVENOUS

## 2020-01-18 MED ORDER — ASPIRIN 81 MG PO CHEW
81.0000 mg | CHEWABLE_TABLET | Freq: Two times a day (BID) | ORAL | Status: DC
Start: 2020-01-18 — End: 2020-01-19
  Administered 2020-01-18 – 2020-01-19 (×2): 81 mg via ORAL
  Filled 2020-01-18 (×2): qty 1

## 2020-01-18 MED ORDER — DIPHENHYDRAMINE HCL 12.5 MG/5ML PO ELIX: 12.5000 mg | ORAL_SOLUTION | ORAL | Status: DC | PRN

## 2020-01-18 MED ORDER — ONDANSETRON HCL 4 MG/2ML IJ SOLN: 4.0000 mg | Freq: Once | INTRAMUSCULAR | Status: DC | PRN

## 2020-01-18 MED ORDER — KETOROLAC TROMETHAMINE 30 MG/ML IJ SOLN
INTRAMUSCULAR | Status: DC | PRN
  Administered 2020-01-18: 30 mg

## 2020-01-18 MED ORDER — SODIUM CHLORIDE (PF) 0.9 % IJ SOLN
INTRAMUSCULAR | Status: DC | PRN
  Administered 2020-01-18: 30 mL

## 2020-01-18 MED ORDER — ORAL CARE MOUTH RINSE: 15.0000 mL | Freq: Once | OROMUCOSAL | Status: AC

## 2020-01-18 MED ORDER — KETOROLAC TROMETHAMINE 30 MG/ML IJ SOLN
INTRAMUSCULAR | Status: AC
  Filled 2020-01-18: qty 1

## 2020-01-18 MED ORDER — TRANEXAMIC ACID-NACL 1000-0.7 MG/100ML-% IV SOLN
1000.0000 mg | INTRAVENOUS | Status: AC
  Administered 2020-01-18: 14:00:00 1000 mg via INTRAVENOUS
  Filled 2020-01-18: qty 100

## 2020-01-18 MED ORDER — LIDOCAINE 2% (20 MG/ML) 5 ML SYRINGE
INTRAMUSCULAR | Status: DC | PRN
  Administered 2020-01-18: 100 mg via INTRAVENOUS

## 2020-01-18 MED ORDER — METHOCARBAMOL 500 MG IVPB - SIMPLE MED
500.0000 mg | Freq: Four times a day (QID) | INTRAVENOUS | Status: DC | PRN
  Administered 2020-01-18: 16:00:00 500 mg via INTRAVENOUS
  Filled 2020-01-18: qty 50

## 2020-01-18 MED ORDER — TRANEXAMIC ACID-NACL 1000-0.7 MG/100ML-% IV SOLN
1000.0000 mg | Freq: Once | INTRAVENOUS | Status: AC
  Administered 2020-01-18: 18:00:00 1000 mg via INTRAVENOUS
  Filled 2020-01-18: qty 100

## 2020-01-18 MED ORDER — PROPOFOL 500 MG/50ML IV EMUL
INTRAVENOUS | Status: DC | PRN
  Administered 2020-01-18: 40 ug/kg/min via INTRAVENOUS

## 2020-01-18 MED ORDER — FENTANYL CITRATE (PF) 100 MCG/2ML IJ SOLN
25.0000 ug | INTRAMUSCULAR | Status: AC | PRN
  Administered 2020-01-18 (×4): 25 ug via INTRAVENOUS

## 2020-01-18 MED ORDER — POLYETHYLENE GLYCOL 3350 17 G PO PACK
17.0000 g | PACK | Freq: Two times a day (BID) | ORAL | Status: DC
  Administered 2020-01-18 – 2020-01-19 (×2): 17 g via ORAL
  Filled 2020-01-18 (×2): qty 1

## 2020-01-18 MED ORDER — DOCUSATE SODIUM 100 MG PO CAPS
100.0000 mg | ORAL_CAPSULE | Freq: Two times a day (BID) | ORAL | Status: DC
  Administered 2020-01-18 – 2020-01-19 (×2): 100 mg via ORAL
  Filled 2020-01-18 (×2): qty 1

## 2020-01-18 MED ORDER — METHOCARBAMOL 500 MG PO TABS
500.0000 mg | ORAL_TABLET | Freq: Four times a day (QID) | ORAL | Status: DC | PRN
  Administered 2020-01-19: 10:00:00 500 mg via ORAL
  Filled 2020-01-18: qty 1

## 2020-01-18 MED ORDER — METOCLOPRAMIDE HCL 5 MG/ML IJ SOLN: 5.0000 mg | Freq: Three times a day (TID) | INTRAMUSCULAR | Status: DC | PRN

## 2020-01-18 MED ORDER — MOMETASONE FURO-FORMOTEROL FUM 200-5 MCG/ACT IN AERO
2.0000 | INHALATION_SPRAY | Freq: Two times a day (BID) | RESPIRATORY_TRACT | Status: DC
  Administered 2020-01-19: 09:00:00 2 via RESPIRATORY_TRACT
  Filled 2020-01-18: qty 8.8

## 2020-01-18 MED ORDER — DIAZEPAM 5 MG PO TABS: 5.0000 mg | ORAL_TABLET | Freq: Every day | ORAL | Status: DC | PRN

## 2020-01-18 MED ORDER — DEXAMETHASONE SODIUM PHOSPHATE 10 MG/ML IJ SOLN: 10.0000 mg | Freq: Once | INTRAMUSCULAR | Status: AC

## 2020-01-18 MED ORDER — MIDAZOLAM HCL 2 MG/2ML IJ SOLN
1.0000 mg | Freq: Once | INTRAMUSCULAR | Status: AC
  Administered 2020-01-18: 13:00:00 1 mg via INTRAVENOUS
  Filled 2020-01-18: qty 2

## 2020-01-18 MED ORDER — PHENOL 1.4 % MT LIQD: 1.0000 | OROMUCOSAL | Status: DC | PRN

## 2020-01-18 SURGICAL SUPPLY — 63 items
ADH SKN CLS APL DERMABOND .7 (GAUZE/BANDAGES/DRESSINGS) ×1
ATTUNE MED ANAT PAT 35 KNEE (Knees) ×1 IMPLANT
ATTUNE MED ANAT PAT 35MM KNEE (Knees) ×1 IMPLANT
ATTUNE PS FEM RT SZ 4 CEM KNEE (Femur) ×2 IMPLANT
ATTUNE PSRP INSR SZ4 6 KNEE (Insert) ×1 IMPLANT
ATTUNE PSRP INSR SZ4 6MM KNEE (Insert) ×1 IMPLANT
BAG SPEC THK2 15X12 ZIP CLS (MISCELLANEOUS)
BAG ZIPLOCK 12X15 (MISCELLANEOUS) IMPLANT
BASEPLATE TIBIAL ROTATING SZ 4 (Knees) ×2 IMPLANT
BLADE SAW SGTL 11.0X1.19X90.0M (BLADE) IMPLANT
BLADE SAW SGTL 13.0X1.19X90.0M (BLADE) ×3 IMPLANT
BLADE SURG SZ10 CARB STEEL (BLADE) ×6 IMPLANT
BNDG ELASTIC 6X5.8 VLCR STR LF (GAUZE/BANDAGES/DRESSINGS) ×3 IMPLANT
BOWL SMART MIX CTS (DISPOSABLE) ×3 IMPLANT
BSPLAT TIB 4 CMNT ROT PLAT STR (Knees) ×1 IMPLANT
CEMENT HV SMART SET (Cement) ×4 IMPLANT
COVER SURGICAL LIGHT HANDLE (MISCELLANEOUS) ×3 IMPLANT
COVER WAND RF STERILE (DRAPES) IMPLANT
CUFF TOURN SGL QUICK 34 (TOURNIQUET CUFF) ×3
CUFF TRNQT CYL 34X4.125X (TOURNIQUET CUFF) ×1 IMPLANT
DECANTER SPIKE VIAL GLASS SM (MISCELLANEOUS) ×6 IMPLANT
DERMABOND ADVANCED (GAUZE/BANDAGES/DRESSINGS) ×2
DERMABOND ADVANCED .7 DNX12 (GAUZE/BANDAGES/DRESSINGS) ×1 IMPLANT
DRAPE U-SHAPE 47X51 STRL (DRAPES) ×3 IMPLANT
DRESSING AQUACEL AG SP 3.5X10 (GAUZE/BANDAGES/DRESSINGS) ×1 IMPLANT
DRSG AQUACEL AG SP 3.5X10 (GAUZE/BANDAGES/DRESSINGS) ×3
DURAPREP 26ML APPLICATOR (WOUND CARE) ×6 IMPLANT
ELECT REM PT RETURN 15FT ADLT (MISCELLANEOUS) ×3 IMPLANT
GLOVE BIO SURGEON STRL SZ 6 (GLOVE) ×3 IMPLANT
GLOVE BIOGEL PI IND STRL 6.5 (GLOVE) ×1 IMPLANT
GLOVE BIOGEL PI IND STRL 7.5 (GLOVE) ×1 IMPLANT
GLOVE BIOGEL PI IND STRL 8.5 (GLOVE) ×1 IMPLANT
GLOVE BIOGEL PI INDICATOR 6.5 (GLOVE) ×2
GLOVE BIOGEL PI INDICATOR 7.5 (GLOVE) ×2
GLOVE BIOGEL PI INDICATOR 8.5 (GLOVE) ×2
GLOVE ECLIPSE 8.0 STRL XLNG CF (GLOVE) ×3 IMPLANT
GLOVE ORTHO TXT STRL SZ7.5 (GLOVE) ×3 IMPLANT
GOWN STRL REUS W/ TWL LRG LVL3 (GOWN DISPOSABLE) ×1 IMPLANT
GOWN STRL REUS W/TWL 2XL LVL3 (GOWN DISPOSABLE) ×3 IMPLANT
GOWN STRL REUS W/TWL LRG LVL3 (GOWN DISPOSABLE) ×6 IMPLANT
HANDPIECE INTERPULSE COAX TIP (DISPOSABLE) ×3
HOLDER FOLEY CATH W/STRAP (MISCELLANEOUS) IMPLANT
KIT TURNOVER KIT A (KITS) IMPLANT
MANIFOLD NEPTUNE II (INSTRUMENTS) ×3 IMPLANT
NDL SAFETY ECLIPSE 18X1.5 (NEEDLE) IMPLANT
NEEDLE HYPO 18GX1.5 SHARP (NEEDLE)
NS IRRIG 1000ML POUR BTL (IV SOLUTION) ×3 IMPLANT
PACK TOTAL KNEE CUSTOM (KITS) ×3 IMPLANT
PENCIL SMOKE EVACUATOR (MISCELLANEOUS) IMPLANT
PIN DRILL FIX HALF THREAD (BIT) ×2 IMPLANT
PIN FIX SIGMA LCS THRD HI (PIN) ×2 IMPLANT
PROTECTOR NERVE ULNAR (MISCELLANEOUS) ×3 IMPLANT
SET HNDPC FAN SPRY TIP SCT (DISPOSABLE) ×1 IMPLANT
SET PAD KNEE POSITIONER (MISCELLANEOUS) ×3 IMPLANT
SUT MNCRL AB 4-0 PS2 18 (SUTURE) ×3 IMPLANT
SUT STRATAFIX PDS+ 0 24IN (SUTURE) ×3 IMPLANT
SUT VIC AB 1 CT1 36 (SUTURE) ×3 IMPLANT
SUT VIC AB 2-0 CT1 27 (SUTURE) ×9
SUT VIC AB 2-0 CT1 TAPERPNT 27 (SUTURE) ×3 IMPLANT
SYR 3ML LL SCALE MARK (SYRINGE) ×3 IMPLANT
TRAY FOLEY MTR SLVR 16FR STAT (SET/KITS/TRAYS/PACK) ×3 IMPLANT
WATER STERILE IRR 1000ML POUR (IV SOLUTION) ×6 IMPLANT
WRAP KNEE MAXI GEL POST OP (GAUZE/BANDAGES/DRESSINGS) ×3 IMPLANT

## 2020-01-18 NOTE — Anesthesia Postprocedure Evaluation (Signed)
Anesthesia Post Note  Patient: Teresa Murphy  Procedure(s) Performed: TOTAL KNEE ARTHROPLASTY (Right Knee)     Patient location during evaluation: PACU Anesthesia Type: Regional and General Level of consciousness: awake and alert and oriented Pain management: pain level controlled Vital Signs Assessment: post-procedure vital signs reviewed and stable Respiratory status: spontaneous breathing, nonlabored ventilation and respiratory function stable Cardiovascular status: blood pressure returned to baseline and stable Postop Assessment: no apparent nausea or vomiting Anesthetic complications: no   No complications documented.  Last Vitals:  Vitals:   01/18/20 1620 01/18/20 1639  BP:    Pulse:  71  Resp:  15  Temp:  36.6 C  SpO2: 100% 100%    Last Pain:  Vitals:   01/18/20 1639  TempSrc:   PainSc: 4         RLE Motor Response: Purposeful movement (01/18/20 1639) RLE Sensation: Full sensation (01/18/20 1639)      Bellamarie Pflug A.

## 2020-01-18 NOTE — Anesthesia Preprocedure Evaluation (Addendum)
Anesthesia Evaluation  Patient identified by MRN, date of birth, ID band Patient awake    Reviewed: Allergy & Precautions, NPO status , Patient's Chart, lab work & pertinent test results  Airway Mallampati: II  TM Distance: >3 FB Neck ROM: Full    Dental no notable dental hx.    Pulmonary asthma ,    Pulmonary exam normal breath sounds clear to auscultation       Cardiovascular hypertension, Pt. on medications Normal cardiovascular exam Rhythm:Regular Rate:Normal  ECG: NSR, rate 90   Neuro/Psych PSYCHIATRIC DISORDERS Anxiety Depression negative neurological ROS     GI/Hepatic negative GI ROS, Neg liver ROS,   Endo/Other  negative endocrine ROS  Renal/GU negative Renal ROS     Musculoskeletal  (+) Arthritis ,   Abdominal (+) + obese,   Peds  Hematology negative hematology ROS (+)   Anesthesia Other Findings Right knee osteoarthritis  Reproductive/Obstetrics                            Anesthesia Physical Anesthesia Plan  ASA: II  Anesthesia Plan: Spinal and Regional   Post-op Pain Management:  Regional for Post-op pain   Induction: Intravenous  PONV Risk Score and Plan: 2 and Ondansetron, Dexamethasone, Propofol infusion, Treatment may vary due to age or medical condition and Midazolam  Airway Management Planned: Simple Face Mask  Additional Equipment:   Intra-op Plan:   Post-operative Plan:   Informed Consent: I have reviewed the patients History and Physical, chart, labs and discussed the procedure including the risks, benefits and alternatives for the proposed anesthesia with the patient or authorized representative who has indicated his/her understanding and acceptance.     Dental advisory given  Plan Discussed with: CRNA  Anesthesia Plan Comments:         Anesthesia Quick Evaluation

## 2020-01-18 NOTE — Discharge Instructions (Signed)

## 2020-01-18 NOTE — Interval H&P Note (Signed)
History and Physical Interval Note:  01/18/2020 12:36 PM  Teresa Murphy  has presented today for surgery, with the diagnosis of Right knee osteoarthritis.  The various methods of treatment have been discussed with the patient and family. After consideration of risks, benefits and other options for treatment, the patient has consented to  Procedure(s) with comments: TOTAL KNEE ARTHROPLASTY (Right) - 70 mins as a surgical intervention.  The patient's history has been reviewed, patient examined, no change in status, stable for surgery.  I have reviewed the patient's chart and labs.  Questions were answered to the patient's satisfaction.     Mauri Pole

## 2020-01-18 NOTE — Care Plan (Signed)
Ortho Bundle Case Management Note  Patient Details  Name: Teresa Murphy MRN: [id number] Date of Birth: [birth year]                  R TKA on 01/18/20. DCP: Home with sister. 2 story home with 1 step. DME: No needs. Has RW & 3in1. PT: EO 12/17   DME Arranged:  N/A DME Agency:     HH Arranged:    [place] Agency:     Additional Comments: Please contact me with any questions of if this plan should need to change.  Marianne Sofia, RN,CCM [facility]  [phone] 01/18/2020, 12:06 PM

## 2020-01-18 NOTE — Op Note (Signed)
NAME:  Teresa Murphy                      MEDICAL RECORD NO.:  [id number]                             FACILITY:  [facility]      PHYSICIAN:  Pietro Cassis. Alvan Dame, M.D.  DATE OF BIRTH:  02-06-1950      DATE OF PROCEDURE:  01/18/2020                                     OPERATIVE REPORT         PREOPERATIVE DIAGNOSIS:  Right knee osteoarthritis.      POSTOPERATIVE DIAGNOSIS:  Right knee osteoarthritis.      FINDINGS:  The patient was noted to have complete loss of cartilage and   bone-on-bone arthritis with associated osteophytes in the medial and patellofemoral compartments of   the knee with a significant synovitis and associated effusion.  The patient had failed months of conservative treatment including medications, injection therapy, activity modification.     PROCEDURE:  Right total knee replacement.      COMPONENTS USED:  DePuy Attune rotating platform posterior stabilized knee   system, a size 4 femur, 4 tibia, size 6 mm PS AOX insert, and 35 anatomic patellar   button.      SURGEON:  Pietro Cassis. Alvan Dame, M.D.      ASSISTANT:  Griffith Citron, PA-C.      ANESTHESIA:  Regional and Spinal.      SPECIMENS:  None.      COMPLICATION:  None.      DRAINS:  None.  EBL: <100cc      TOURNIQUET TIME:  27 min at 250 mmHg .      The patient was stable to the recovery room.      INDICATION FOR PROCEDURE:  Teresa Murphy is a 69 y.o. female patient of   mine.  The patient had been seen, evaluated, and treated for months conservatively in the   office with medication, activity modification, and injections.  The patient had   radiographic changes of bone-on-bone arthritis with endplate sclerosis and osteophytes noted.  Based on the radiographic changes and failed conservative measures, the patient   decided to proceed with definitive treatment, total knee replacement.  Risks of infection, DVT, component failure, need for revision surgery, neurovascular injury were reviewed in the office  setting.  The postop course was reviewed stressing the efforts to maximize post-operative satisfaction and function.  Consent was obtained for benefit of pain   relief.      PROCEDURE IN DETAIL:  The patient was brought to the operative theater.   Once adequate anesthesia, preoperative antibiotics, 2 gm of Ancef,1 gm of Tranexamic Acid, and 10 mg of Decadron administered, the patient was positioned supine with a right thigh tourniquet placed.  The  right lower extremity was prepped and draped in sterile fashion.  A time-   out was performed identifying the patient, planned procedure, and the appropriate extremity.      The right lower extremity was placed in the [facility] leg holder.  The leg was   exsanguinated, tourniquet elevated to 250 mmHg.  A midline incision was   made followed by median parapatellar arthrotomy.  Following initial   exposure, attention  was first directed to the patella.  Precut   measurement was noted to be 21 mm.  I resected down to 14 mm and used a   35 anatomic patellar button to restore patellar height as well as cover the cut surface.      The lug holes were drilled and a metal shim was placed to protect the   patella from retractors and saw blade during the procedure.      At this point, attention was now directed to the femur.  The femoral   canal was opened with a drill, irrigated to try to prevent fat emboli.  An   intramedullary rod was passed at 3 degrees valgus, 9 mm of bone was   resected off the distal femur.  Following this resection, the tibia was   subluxated anteriorly.  Using the extramedullary guide, 2 mm of bone was resected off   the proximal medial tibia.  We confirmed the gap would be   stable medially and laterally with a size 5 spacer block as well as confirmed that the tibial cut was perpendicular in the coronal plane, checking with an alignment rod.      Once this was done, I sized the femur to be a size 4 in the anterior-   posterior  dimension, chose a standard component based on medial and   lateral dimension.  The size 4 rotation block was then pinned in   position anterior referenced using the C-clamp to set rotation.  The   anterior, posterior, and  chamfer cuts were made without difficulty nor   notching making certain that I was along the anterior cortex to help   with flexion gap stability.      The final box cut was made off the lateral aspect of distal femur.      At this point, the tibia was sized to be a size 4.  The size 4 tray was   then pinned in position through the medial third of the tubercle,   drilled, and keel punched.  Trial reduction was now carried with a 4 femur,  4 tibia, a size 6 mm PS insert, and the 35 anatomic patella botton.  The knee was brought to full extension with good flexion stability with the patella   tracking through the trochlea without application of pressure.  Given   all these findings the trial components removed.  Final components were   opened and cement was mixed.  The knee was irrigated with normal saline solution and pulse lavage.  The synovial lining was   then injected with 30 cc of 0.25% Marcaine with epinephrine, 1 cc of Toradol and 30 cc of NS for a total of 61 cc.     Final implants were then cemented onto cleaned and dried cut surfaces of bone with the knee brought to extension with a size 6 mm PS trial insert.      Once the cement had fully cured, excess cement was removed   throughout the knee.  I confirmed that I was satisfied with the range of   motion and stability, and the final size 6 mm PS AOX insert was chosen.  It was   placed into the knee.      The tourniquet had been let down at 27 minutes.  No significant   hemostasis was required.  The extensor mechanism was then reapproximated using #1 Vicryl and #1 Stratafix sutures with the knee   in flexion.  The  remaining wound was closed with 2-0 Vicryl and running 4-0 Monocryl.   The knee was cleaned,  dried, dressed sterilely using Dermabond and   Aquacel dressing.  The patient was then   brought to recovery room in stable condition, tolerating the procedure   well.   Please note that Physician Assistant, Griffith Citron, PA-C was present for the entirety of the case, and was utilized for pre-operative positioning, peri-operative retractor management, general facilitation of the procedure and for primary wound closure at the end of the case.              Pietro Cassis Alvan Dame, M.D.    01/18/2020 2:01 PM

## 2020-01-18 NOTE — Anesthesia Procedure Notes (Signed)
Anesthesia Regional Block: Adductor canal block   Pre-Anesthetic Checklist: ,, timeout performed, Correct Patient, Correct Site, Correct Laterality, Correct Procedure,, site marked, risks and benefits discussed, Surgical consent,  Pre-op evaluation,  At surgeon's request and post-op pain management  Laterality: Right  Prep: chloraprep       Needles:  Injection technique: Single-shot  Needle Type: Echogenic Stimulator Needle     Needle Length: 10cm  Needle Gauge: 20     Additional Needles:   Procedures:,,,, ultrasound used (permanent image in chart),,,,  Narrative:  Start time: 01/18/2020 12:40 PM End time: 01/18/2020 12:50 PM Injection made incrementally with aspirations every 5 mL.  Performed by: Personally  Anesthesiologist: Murvin Natal, MD  Additional Notes: Functioning IV was confirmed and monitors were applied. A time-out was performed. Hand hygiene and sterile gloves were used. The thigh was placed in a frog-leg position and prepped in a sterile fashion. A 159mm 20ga BBraun echogenic stimulator needle was placed using ultrasound guidance.  Negative aspiration and negative test dose prior to incremental administration of local anesthetic. The patient tolerated the procedure well.

## 2020-01-18 NOTE — Anesthesia Procedure Notes (Signed)
Procedure Name: Intubation Date/Time: 01/18/2020 2:13 PM Performed by: Lavina Hamman, CRNA Pre-anesthesia Checklist: Patient identified, Emergency Drugs available, Suction available, Patient being monitored and Timeout performed Patient Re-evaluated:Patient Re-evaluated prior to induction Oxygen Delivery Method: Circle system utilized Preoxygenation: Pre-oxygenation with 100% oxygen Induction Type: IV induction Ventilation: Mask ventilation without difficulty Laryngoscope Size: Mac and 3 Grade View: Grade I Tube type: Oral Tube size: 7.5 mm Number of attempts: 1 Airway Equipment and Method: Stylet Placement Confirmation: ETT inserted through vocal cords under direct vision,  positive ETCO2,  CO2 detector and breath sounds checked- equal and bilateral Secured at: 22 cm Tube secured with: Tape Dental Injury: Teeth and Oropharynx as per pre-operative assessment

## 2020-01-18 NOTE — Progress Notes (Signed)
AssistedDr. Ellender with right, ultrasound guided, adductor canal block. Side rails up, monitors on throughout procedure. See vital signs in flow sheet. Tolerated Procedure well.  

## 2020-01-18 NOTE — Transfer of Care (Signed)
Immediate Anesthesia Transfer of Care Note  Patient: Teresa Murphy  Procedure(s) Performed: TOTAL KNEE ARTHROPLASTY (Right Knee)  Patient Location: PACU  Anesthesia Type:General  Level of Consciousness: awake, alert  and oriented  Airway & Oxygen Therapy: Patient Spontanous Breathing and Patient connected to face mask oxygen  Post-op Assessment: Report given to RN, Post -op Vital signs reviewed and stable and Patient moving all extremities X 4  Post vital signs: Reviewed and stable  Last Vitals:  Vitals Value Taken Time  BP 161/90 01/18/20 1530  Temp    Pulse 74 01/18/20 1531  Resp 17 01/18/20 1531  SpO2 100 % 01/18/20 1531  Vitals shown include unvalidated device data.  Last Pain:  Vitals:   01/18/20 1250  TempSrc:   PainSc: 0-No pain      Patients Stated Pain Goal: 4 (67/73/73 6681)  Complications: No complications documented.

## 2020-01-19 DIAGNOSIS — E669 Obesity, unspecified: Secondary | ICD-10-CM | POA: Diagnosis present

## 2020-01-19 DIAGNOSIS — J45909 Unspecified asthma, uncomplicated: Secondary | ICD-10-CM | POA: Diagnosis not present

## 2020-01-19 DIAGNOSIS — M1711 Unilateral primary osteoarthritis, right knee: Secondary | ICD-10-CM | POA: Diagnosis not present

## 2020-01-19 DIAGNOSIS — Z79899 Other long term (current) drug therapy: Secondary | ICD-10-CM | POA: Diagnosis not present

## 2020-01-19 DIAGNOSIS — I10 Essential (primary) hypertension: Secondary | ICD-10-CM | POA: Diagnosis not present

## 2020-01-19 LAB — BASIC METABOLIC PANEL
Anion gap: 9 (ref 5–15)
BUN: 17 mg/dL (ref 8–23)
CO2: 26 mmol/L (ref 22–32)
Calcium: 8 mg/dL — ABNORMAL LOW (ref 8.9–10.3)
Chloride: 103 mmol/L (ref 98–111)
Creatinine, Ser: 0.81 mg/dL (ref 0.44–1.00)
GFR, Estimated: >60 mL/min (ref >60)
Glucose, Bld: 153 mg/dL — ABNORMAL HIGH (ref 70–99)
Potassium: 3.7 mmol/L (ref 3.5–5.1)
Sodium: 138 mmol/L (ref 135–145)

## 2020-01-19 LAB — CBC
HCT: 35.6 % — ABNORMAL LOW (ref 36.0–46.0)
Hemoglobin: 11.8 g/dL — ABNORMAL LOW (ref 12.0–15.0)
MCH: 31.1 pg (ref 26.0–34.0)
MCHC: 33.1 g/dL (ref 30.0–36.0)
MCV: 93.7 fL (ref 80.0–100.0)
Platelets: 251 10*3/uL (ref 150–400)
RBC: 3.8 MIL/uL — ABNORMAL LOW (ref 3.87–5.11)
RDW: 12.4 % (ref 11.5–15.5)
WBC: 13.8 10*3/uL — ABNORMAL HIGH (ref 4.0–10.5)
nRBC: 0 % (ref 0.0–0.2)

## 2020-01-19 MED ORDER — HYDROCODONE-ACETAMINOPHEN 7.5-325 MG PO TABS: 1.0000 | ORAL_TABLET | ORAL | 0 refills | Status: DC | PRN

## 2020-01-19 MED ORDER — FERROUS SULFATE 325 (65 FE) MG PO TABS: 325.0000 mg | ORAL_TABLET | Freq: Three times a day (TID) | ORAL | 0 refills | Status: DC

## 2020-01-19 MED ORDER — POLYETHYLENE GLYCOL 3350 17 G PO PACK: 17.0000 g | PACK | Freq: Two times a day (BID) | ORAL | 0 refills | Status: DC

## 2020-01-19 MED ORDER — CELECOXIB 200 MG PO CAPS: 200.0000 mg | ORAL_CAPSULE | Freq: Two times a day (BID) | ORAL | 0 refills | Status: DC

## 2020-01-19 MED ORDER — ASPIRIN 81 MG PO CHEW: 81.0000 mg | CHEWABLE_TABLET | Freq: Two times a day (BID) | ORAL | 0 refills | Status: AC

## 2020-01-19 MED ORDER — DOCUSATE SODIUM 100 MG PO CAPS: 100.0000 mg | ORAL_CAPSULE | Freq: Two times a day (BID) | ORAL | 0 refills | Status: DC

## 2020-01-19 MED ORDER — METHOCARBAMOL 500 MG PO TABS: 500.0000 mg | ORAL_TABLET | Freq: Four times a day (QID) | ORAL | 0 refills | Status: DC | PRN

## 2020-01-19 NOTE — Evaluation (Signed)
Physical Therapy Evaluation Patient Details Name: Teresa Murphy MRN: [id number] DOB: March 01, 1950 Today's Date: 01/19/2020   History of Present Illness  Pt is a 69 year old female s/p Right TKA  Clinical Impression  Pt is s/p TKA resulting in the deficits listed below (see PT Problem List).  Pt will benefit from skilled PT to increase their independence and safety with mobility to allow discharge to the venue listed below.  Pt assisted with ambulating in hallway and performed a couple exercises sitting in recliner.  Pt anticipates d/c home to her mother's house and will have her sister's assistance.  Pt will need to practice steps prior to d/c.    Follow Up Recommendations Follow surgeon's recommendation for DC plan and follow-up therapies    Equipment Recommendations  None recommended by PT    Recommendations for Other Services       Precautions / Restrictions Precautions Precautions: Fall;Knee Restrictions Weight Bearing Restrictions: No      Mobility  Bed Mobility Overal bed mobility: Needs Assistance Bed Mobility: Supine to Sit     Supine to sit: Min guard;HOB elevated     General bed mobility comments: verbal cues for self assist    Transfers Overall transfer level: Needs assistance Equipment used: Rolling walker (2 wheeled) Transfers: Sit to/from Stand Sit to Stand: Min guard;Min assist         General transfer comment: verbal cues for UE and LE positioning, slight assist to rise and steady  Ambulation/Gait Ambulation/Gait assistance: Min guard Gait Distance (Feet): 160 Feet Assistive device: Rolling walker (2 wheeled) Gait Pattern/deviations: Step-to pattern;Step-through pattern;Antalgic;Decreased stance time - right     General Gait Details: verbal cues for sequence, RW positioning, step length  Stairs            Wheelchair Mobility    Modified Rankin (Stroke Patients Only)       Balance                                              Pertinent Vitals/Pain Pain Assessment: 0-10 Pain Score: 4  Pain Location: right knee Pain Descriptors / Indicators: Sore;Tightness Pain Intervention(s): Monitored during session;Repositioned    Home Living Family/patient expects to be discharged to:: Private residence Living Arrangements: Spouse/significant other   Type of Home: House Home Access: Stairs to enter Entrance Stairs-Rails: None Technical brewer of Steps: 1+1 Home Layout: One level Home Equipment: Environmental consultant - 2 wheels Additional Comments: pt lives with spouse but plans to d/c home to her mothers house as above and sister to also assist    Prior Function Level of Independence: Independent               Hand Dominance        Extremity/Trunk Assessment        Lower Extremity Assessment Lower Extremity Assessment: RLE deficits/detail RLE Deficits / Details: able to perform SLR, right knee flexion AAROM 75*       Communication   Communication: No difficulties  Cognition Arousal/Alertness: Awake/alert Behavior During Therapy: WFL for tasks assessed/performed Overall Cognitive Status: Within Functional Limits for tasks assessed                                        General Comments  Exercises Total Joint Exercises Ankle Circles/Pumps: AROM;Both;10 reps Quad Sets: AROM;Both;10 reps Heel Slides: AAROM;Right;10 reps;Supine   Assessment/Plan    PT Assessment Patient needs continued PT services  PT Problem List Decreased strength;Decreased mobility;Decreased range of motion;Decreased knowledge of use of DME;Pain;Decreased knowledge of precautions       PT Treatment Interventions Stair training;Gait training;DME instruction;Therapeutic exercise;Functional mobility training;Therapeutic activities;Patient/family education    PT Goals (Current goals can be found in the Care Plan section)  Acute Rehab PT Goals PT Goal Formulation: With patient Time For Goal  Achievement: 01/24/20 Potential to Achieve Goals: Good    Frequency 7X/week   Barriers to discharge        Co-evaluation               AM-PAC PT "6 Clicks" Mobility  Outcome Measure Help needed turning from your back to your side while in a flat bed without using bedrails?: A Little Help needed moving from lying on your back to sitting on the side of a flat bed without using bedrails?: A Little Help needed moving to and from a bed to a chair (including a wheelchair)?: A Little Help needed standing up from a chair using your arms (e.g., wheelchair or bedside chair)?: A Little Help needed to walk in hospital room?: A Little Help needed climbing 3-5 steps with a railing? : A Little 6 Click Score: 18    End of Session Equipment Utilized During Treatment: Gait belt Activity Tolerance: Patient tolerated treatment well Patient left: in chair;with chair alarm set;with call bell/phone within reach Nurse Communication: Mobility status PT Visit Diagnosis: Other abnormalities of gait and mobility (R26.89);Pain Pain - Right/Left: Right Pain - part of body: Knee    Time: 1020-1040 PT Time Calculation (min) (ACUTE ONLY): 20 min   Charges:   PT Evaluation $PT Eval Low Complexity: 1 Low     Kati PT, DPT Acute Rehabilitation Services Pager: [phone] Office: [phone]  York Ram E 01/19/2020, 12:21 PM

## 2020-01-19 NOTE — Progress Notes (Signed)
Met briefly with pt who is part of [facility].  Has all needed DME and plan for OPPT at [facility].  No TOC needs.  HOYLE, LUCY, LCSW 

## 2020-01-19 NOTE — Progress Notes (Signed)
Physical Therapy Treatment Patient Details Name: Teresa Murphy MRN: [id number] DOB: 1951-01-06 Today's Date: 01/19/2020    History of Present Illness Pt is a 69 year old female s/p Right TKA    PT Comments    Pt ambulated in hallway and practiced safe technique with one step.  Pt reports understanding and had no further questions.  Pt feels ready for d/c today.   Follow Up Recommendations  Follow surgeon's recommendation for DC plan and follow-up therapies     Equipment Recommendations  None recommended by PT    Recommendations for Other Services       Precautions / Restrictions Precautions Precautions: Fall;Knee Restrictions Weight Bearing Restrictions: No    Mobility  Bed Mobility               General bed mobility comments: pt in recliner on arrival  Transfers Overall transfer level: Needs assistance Equipment used: Rolling walker (2 wheeled) Transfers: Sit to/from Stand Sit to Stand: Min guard         General transfer comment: verbal cues for UE and LE positioning  Ambulation/Gait Ambulation/Gait assistance: Min guard Gait Distance (Feet): 240 Feet Assistive device: Rolling walker (2 wheeled) Gait Pattern/deviations: Step-through pattern;Antalgic;Decreased stance time - right Gait velocity: decr   General Gait Details: verbal cues for sequence, RW positioning, step length   Stairs Stairs: Yes Stairs assistance: Min guard Stair Management: Forwards;Backwards;Step to pattern;With walker Number of Stairs: 1 General stair comments: verbal cues for safe sequencing, pt performed forwards x3 and backwards x2, pt reports understanding   Wheelchair Mobility    Modified Rankin (Stroke Patients Only)       Balance                                            Cognition Arousal/Alertness: Awake/alert Behavior During Therapy: WFL for tasks assessed/performed Overall Cognitive Status: Within Functional Limits for tasks  assessed                                        Exercises      General Comments        Pertinent Vitals/Pain Pain Assessment: 0-10 Pain Score: 5  Pain Location: right knee Pain Descriptors / Indicators: Sore;Tightness Pain Intervention(s): Repositioned;Monitored during session    Home Living                      Prior Function            PT Goals (current goals can now be found in the care plan section) Acute Rehab PT Goals PT Goal Formulation: With patient Time For Goal Achievement: 01/24/20 Potential to Achieve Goals: Good Progress towards PT goals: Progressing toward goals    Frequency    7X/week      PT Plan Current plan remains appropriate    Co-evaluation              AM-PAC PT "6 Clicks" Mobility   Outcome Measure  Help needed turning from your back to your side while in a flat bed without using bedrails?: A Little Help needed moving from lying on your back to sitting on the side of a flat bed without using bedrails?: A Little Help needed moving to and from a bed to a  chair (including a wheelchair)?: A Little Help needed standing up from a chair using your arms (e.g., wheelchair or bedside chair)?: A Little Help needed to walk in hospital room?: A Little Help needed climbing 3-5 steps with a railing? : A Little 6 Click Score: 18    End of Session Equipment Utilized During Treatment: Gait belt Activity Tolerance: Patient tolerated treatment well Patient left: in chair;with chair alarm set;with call bell/phone within reach Nurse Communication: Mobility status PT Visit Diagnosis: Other abnormalities of gait and mobility (R26.89) Pain - Right/Left: Right Pain - part of body: Knee     Time: 1350-1410 PT Time Calculation (min) (ACUTE ONLY): 20 min  Charges:  $Gait Training: 8-22 mins                     Arlyce Dice, DPT Acute Rehabilitation Services Pager: [phone] Office: [phone]  York Ram  E 01/19/2020, 4:19 PM

## 2020-01-19 NOTE — Progress Notes (Signed)
Patient discharged to home w/ family. Given all belongings, instructions. Verbalized understanding of instructions. Escorted to pov via w/c. 

## 2020-01-19 NOTE — Progress Notes (Signed)
     Subjective: 1 Day Post-Op Procedure(s) (LRB): TOTAL KNEE ARTHROPLASTY (Right)   Seen by Dr. Alvan Dame. Patient reports pain as mild, pain controlled. No reported events throughout the night.  Dr. Alvan Dame discussed the procedure, findings and expectations moving forward.  Ready to be discharged home, if they do well with PT.  Follow up in the clinic in 2 weeks.  Knows to call with any questions or concerns.     Patient's anticipated LOS is less than 2 midnights, meeting these requirements: - Lives within 1 hour of care - Has a competent adult at home to recover with post-op recover - NO history of  - Chronic pain requiring opiods  - Diabetes  - Coronary Artery Disease  - Heart failure  - Heart attack  - Stroke  - DVT/VTE  - Cardiac arrhythmia  - Respiratory Failure/COPD  - Renal failure  - Anemia  - Advanced Liver disease       Objective:   VITALS:   Vitals:   01/19/20 0657 01/19/20 0837  BP: 116/60   Pulse: 70   Resp: 16   Temp: 98.1 F (36.7 C)   SpO2: 100% 100%    Dorsiflexion/Plantar flexion intact Incision: dressing C/D/I No cellulitis present Compartment soft  LABS Recent Labs    01/19/20 0308  HGB 11.8*  HCT 35.6*  WBC 13.8*  PLT 251    Recent Labs    01/19/20 0308  NA 138  K 3.7  BUN 17  CREATININE 0.81  GLUCOSE 153*     Assessment/Plan: 1 Day Post-Op Procedure(s) (LRB): TOTAL KNEE ARTHROPLASTY (Right) Foley cath d/c'ed Advance diet Up with therapy D/C IV fluids Discharge home Follow up in 2 weeks at [facility] Follow up with OLIN,Hazeline Charnley D in 2 weeks.  Contact information:  [facility] [address], [address] [phone]    Obese (BMI 30-39.9) Estimated body mass index is 37.4 kg/m as calculated from the following:   Height as of this encounter: 5\' 3"  (1.6 m).   Weight as of this encounter: 95.8 kg. Patient also counseled that weight may inhibit the healing process Patient  counseled that losing weight will help with future health issues       Danae Orleans PA-C  [facility]  [place] [address], [address], [place], [place] [address] Phone: [phone] [email or web address] Facebook  [employer]

## 2020-01-19 NOTE — Plan of Care (Signed)
  Problem: Health Behavior/Discharge Planning: Goal: Ability to manage health-related needs will improve Outcome: Progressing   Problem: Clinical Measurements: Goal: Ability to maintain clinical measurements within normal limits will improve Outcome: Progressing Goal: Will remain free from infection Outcome: Progressing Goal: Diagnostic test results will improve Outcome: Progressing Goal: Respiratory complications will improve Outcome: Progressing Goal: Cardiovascular complication will be avoided Outcome: Progressing   Problem: Activity: Goal: Risk for activity intolerance will decrease Outcome: Progressing   Problem: Coping: Goal: Level of anxiety will decrease Outcome: Progressing   Problem: Elimination: Goal: Will not experience complications related to bowel motility Outcome: Progressing Goal: Will not experience complications related to urinary retention Outcome: Progressing   Problem: Pain Managment: Goal: General experience of comfort will improve Outcome: Progressing   Problem: Safety: Goal: Ability to remain free from injury will improve Outcome: Progressing   Problem: Skin Integrity: Goal: Risk for impaired skin integrity will decrease Outcome: Progressing   Problem: Education: Goal: Knowledge of the prescribed therapeutic regimen will improve Outcome: Progressing   Problem: Activity: Goal: Ability to avoid complications of mobility impairment will improve Outcome: Progressing Goal: Range of joint motion will improve Outcome: Progressing   Problem: Clinical Measurements: Goal: Postoperative complications will be avoided or minimized Outcome: Progressing   Problem: Pain Management: Goal: Pain level will decrease with appropriate interventions Outcome: Progressing   Problem: Skin Integrity: Goal: Will show signs of wound healing Outcome: Progressing

## 2020-01-20 ENCOUNTER — Encounter (HOSPITAL_COMMUNITY): Payer: Self-pay | Admitting: Orthopedic Surgery

## 2020-01-20 NOTE — Discharge Summary (Signed)
Patient ID: Teresa Murphy MRN: [id number] DOB/AGE: 11/02/1950 69 y.o.  Admit date: 01/18/2020 Discharge date: 01/20/2020  Admission Diagnoses:  Principal Problem:   Osteoarthritis of right knee Active Problems:   Status post total right knee replacement   Obese   Discharge Diagnoses:  Same  Past Medical History:  Diagnosis Date  . Allergic rhinitis   . Anxiety   . Anxiety disorder 07/10/2015  . Arthritis   . Asthma   . Asthma with bronchitis 04/19/2010   PFT 09/18/2010-FEV1  1.95/92; FEV1/FVC 0.76; FEF 25-75% improved 48% with bronchodilator; normal lung volumes; DLCO 78%    . CAP (community acquired pneumonia) 03/20/2016  . Depression   . Essential hypertension 04/21/2010   Qualifier: Diagnosis of  By: Annamaria Boots MD, Clinton D   . History of kidney stones   . Hyperlipidemia 11/25/2017  . Hypertension   . Osteoarthritis of both knees 07/10/2015  . Pneumonia    hx of   . Seasonal and perennial allergic rhinitis 04/19/2010   Allergy Profile 04/19/2010-negative, IgE  3.8 Allergy skin test 11/07/2010-positive for grass, weed, tree pollens, dust mite   . Severe obesity (BMI 35.0-39.9) with comorbidity ([place]) 01/11/2016  . Vitamin D deficiency 07/10/2015    Surgeries: Procedure(s): RIGHT TOTAL KNEE ARTHROPLASTY on 01/18/2020   Consultants: N/A  Discharged Condition: Improved  Hospital Course: LARIZA COTHRON is an 69 y.o. female who was admitted 01/18/2020 for operative treatment ofOsteoarthritis of right knee. Patient has severe unremitting pain that affects sleep, daily activities, and work/hobbies. After pre-op clearance the patient was taken to the operating room on 01/18/2020 and underwent  Procedure(s):  RIGHT TOTAL KNEE ARTHROPLASTY.    Patient was given perioperative antibiotics:  Anti-infectives (From admission, onward)   Start     Dose/Rate Route Frequency Ordered Stop   01/18/20 2000  ceFAZolin (ANCEF) IVPB 2g/100 mL premix        2 g 200 mL/hr over 30 Minutes  Intravenous Every 6 hours 01/18/20 1709 01/19/20 0245   01/18/20 1130  ceFAZolin (ANCEF) IVPB 2g/100 mL premix        2 g 200 mL/hr over 30 Minutes Intravenous On call to O.R. 01/18/20 1117 01/18/20 1357       Patient was given sequential compression devices, early ambulation, and chemoprophylaxis to prevent DVT.  Patient benefited maximally from hospital stay and there were no complications.    Recent vital signs: No data found.   Recent laboratory studies:  Recent Labs    01/19/20 0308  WBC 13.8*  HGB 11.8*  HCT 35.6*  PLT 251  NA 138  K 3.7  CL 103  CO2 26  BUN 17  CREATININE 0.81  GLUCOSE 153*  CALCIUM 8.0*     Discharge Medications:   Allergies as of 01/19/2020   No Known Allergies     Medication List    STOP taking these medications   naproxen sodium 220 MG tablet Commonly known as: ALEVE     TAKE these medications   albuterol 108 (90 Base) MCG/ACT inhaler Commonly known as: ProAir HFA Inhale 2 puffs into the lungs every 4 (four) hours as needed. What changed: reasons to take this   aspirin 81 MG chewable tablet Commonly known as: Aspirin Childrens Chew 1 tablet (81 mg total) by mouth 2 (two) times daily. Take for 4 weeks, then resume regular dose.   celecoxib 200 MG capsule Commonly known as: CeleBREX Take 1 capsule (200 mg total) by mouth 2 (two) times daily.  cetirizine 10 MG tablet Commonly known as: ZYRTEC Take 10 mg by mouth at bedtime.   Cinnamon 500 MG capsule Take 500 mg by mouth daily.   diazepam 5 MG tablet Commonly known as: VALIUM Take 1 tablet (5 mg total) by mouth daily as needed for anxiety.   docusate sodium 100 MG capsule Commonly known as: Colace Take 1 capsule (100 mg total) by mouth 2 (two) times daily.   DULoxetine 60 MG capsule Commonly known as: CYMBALTA Take 1 capsule (60 mg total) by mouth daily.   ferrous sulfate 325 (65 FE) MG tablet Commonly known as: FerrouSul Take 1 tablet (325 mg total) by mouth 3  (three) times daily with meals for 14 days.   Fluticasone-Salmeterol 250-50 MCG/DOSE Aepb Commonly known as: Advair Diskus INHALE 1 PUFF INTO LUNGS TWICE A DAY RINSE AFTER USE What changed:   how much to take  how to take this  when to take this  reasons to take this  additional instructions   HYDROcodone-acetaminophen 7.5-325 MG tablet Commonly known as: Norco Take 1-2 tablets by mouth every 4 (four) hours as needed for moderate pain.   latanoprost 0.005 % ophthalmic solution Commonly known as: XALATAN Place 1 drop into both eyes at bedtime.   methocarbamol 500 MG tablet Commonly known as: Robaxin Take 1 tablet (500 mg total) by mouth every 6 (six) hours as needed for muscle spasms.   montelukast 10 MG tablet Commonly known as: SINGULAIR TAKE 1 TABLET BY MOUTH EVERYDAY AT BEDTIME What changed: See the new instructions.   MOVE FREE JOINT HEALTH ADVANCE PO Take 1 tablet by mouth daily.   Multivitamin Women 50+ Tabs Take 1 tablet by mouth daily.   olmesartan 20 MG tablet Commonly known as: BENICAR Take 1 tablet (20 mg total) by mouth daily.   polyethylene glycol 17 g packet Commonly known as: MIRALAX / GLYCOLAX Take 17 g by mouth 2 (two) times daily.   vitamin B-12 500 MCG tablet Commonly known as: CYANOCOBALAMIN Take 500 mcg by mouth daily.   Vitamin D (Ergocalciferol) 1.25 MG (50000 UNIT) Caps capsule Commonly known as: DRISDOL TAKE ONE CAPSULE BY MOUTH EVERY 5 DAYS What changed: See the new instructions.            Discharge Care Instructions  (From admission, onward)         Start     Ordered   01/19/20 0000  Change dressing       Comments: Maintain surgical dressing until follow up in the clinic. If the edges start to pull up, may reinforce with tape. If the dressing is no longer working, may remove and cover with gauze and tape, but must keep the area dry and clean.  Call with any questions or concerns.   01/19/20 0859           Diagnostic Studies: US Soft Tissue Head/Neck (NON-THYROID)  Result Date: 01/13/2020 CLINICAL DATA:  Left supraclavicular fullness for the past week. EXAM: ULTRASOUND OF HEAD/NECK SOFT TISSUES TECHNIQUE: Ultrasound examination of the head and neck soft tissues was performed in the area of clinical concern. COMPARISON:  None. FINDINGS: Sonographic evaluation of the patient's palpable area of concern within the left supraclavicular fossa is negative for discrete sonographic correlate. Specifically, no regional supraclavicular lymphadenopathy. No discrete solid or cystic lesions. IMPRESSION: No sonographic correlate for patient's palpable area of concern involving the left supraclavicular fossa. Electronically Signed   By: Sandi Mariscal M.D.   On: 01/13/2020 08:34  Disposition: Home  Discharge Instructions    Call MD / Call 911   Complete by: As directed    If you experience chest pain or shortness of breath, CALL 911 and be transported to the hospital emergency room.  If you develope a fever above 101 F, pus (white drainage) or increased drainage or redness at the wound, or calf pain, call your surgeon's office.   Change dressing   Complete by: As directed    Maintain surgical dressing until follow up in the clinic. If the edges start to pull up, may reinforce with tape. If the dressing is no longer working, may remove and cover with gauze and tape, but must keep the area dry and clean.  Call with any questions or concerns.   Constipation Prevention   Complete by: As directed    Drink plenty of fluids.  Prune juice may be helpful.  You may use a stool softener, such as Colace (over the counter) 100 mg twice a day.  Use MiraLax (over the counter) for constipation as needed.   Diet - low sodium heart healthy   Complete by: As directed    Discharge instructions   Complete by: As directed    Maintain surgical dressing until follow up in the clinic. If the edges start to pull up, may reinforce  with tape. If the dressing is no longer working, may remove and cover with gauze and tape, but must keep the area dry and clean.  Follow up in 2 weeks at [facility]. Call with any questions or concerns.   Increase activity slowly as tolerated   Complete by: As directed    Weight bearing as tolerated with assist device (walker, cane, etc) as directed, use it as long as suggested by your surgeon or therapist, typically at least 4-6 weeks.   TED hose   Complete by: As directed    Use stockings (TED hose) for 2 weeks on both leg(s).  You may remove them at night for sleeping.       Follow-up Information    Rosilyn Mings.. Go on 01/21/2020.   Why: You are scheduled for physical therapy eval on Friday December 17th at 4:00pm. Contact information: [place] [address] [phone]        Paralee Cancel, MD. Go on 02/10/2020.   Specialty: Orthopedic Surgery Why: You are scheduled for first post op appointment on Thursday January 6th at 10:30am. Contact information: [address] [place] [address] [phone]                Signed: Lucille Passy [facility] 01/20/2020, 2:40 PM

## 2020-01-21 ENCOUNTER — Telehealth: Payer: Self-pay | Admitting: Family Medicine

## 2020-01-21 DIAGNOSIS — M25561 Pain in right knee: Secondary | ICD-10-CM | POA: Diagnosis not present

## 2020-01-21 NOTE — Progress Notes (Signed)
  Chronic Care Management   Outreach Note  01/21/2020 Name: Teresa Murphy MRN: [id number] DOB: Jan 23, 1951  Referred by: Martin[place], [name], MD Reason for referral : No chief complaint on file.   An unsuccessful telephone outreach was attempted today. The patient was referred to the pharmacist for assistance with care management and care coordination.   Follow Up Plan:   Carley Perdue [employer] Scheduler

## 2020-01-24 DIAGNOSIS — M25561 Pain in right knee: Secondary | ICD-10-CM | POA: Diagnosis not present

## 2020-01-26 DIAGNOSIS — M25561 Pain in right knee: Secondary | ICD-10-CM | POA: Diagnosis not present

## 2020-02-01 DIAGNOSIS — Z96651 Presence of right artificial knee joint: Secondary | ICD-10-CM | POA: Diagnosis not present

## 2020-02-01 DIAGNOSIS — M25561 Pain in right knee: Secondary | ICD-10-CM | POA: Diagnosis not present

## 2020-02-03 DIAGNOSIS — Z96651 Presence of right artificial knee joint: Secondary | ICD-10-CM | POA: Diagnosis not present

## 2020-02-03 DIAGNOSIS — M25561 Pain in right knee: Secondary | ICD-10-CM | POA: Diagnosis not present

## 2020-02-04 DIAGNOSIS — M25561 Pain in right knee: Secondary | ICD-10-CM | POA: Diagnosis not present

## 2020-02-04 DIAGNOSIS — Z96651 Presence of right artificial knee joint: Secondary | ICD-10-CM | POA: Diagnosis not present

## 2020-02-07 DIAGNOSIS — M25561 Pain in right knee: Secondary | ICD-10-CM | POA: Diagnosis not present

## 2020-02-09 DIAGNOSIS — M25561 Pain in right knee: Secondary | ICD-10-CM | POA: Diagnosis not present

## 2020-02-11 DIAGNOSIS — M25561 Pain in right knee: Secondary | ICD-10-CM | POA: Diagnosis not present

## 2020-02-14 DIAGNOSIS — Z96651 Presence of right artificial knee joint: Secondary | ICD-10-CM | POA: Diagnosis not present

## 2020-02-14 DIAGNOSIS — M25561 Pain in right knee: Secondary | ICD-10-CM | POA: Diagnosis not present

## 2020-02-16 DIAGNOSIS — M25561 Pain in right knee: Secondary | ICD-10-CM | POA: Diagnosis not present

## 2020-02-16 DIAGNOSIS — Z96651 Presence of right artificial knee joint: Secondary | ICD-10-CM | POA: Diagnosis not present

## 2020-02-18 DIAGNOSIS — Z96651 Presence of right artificial knee joint: Secondary | ICD-10-CM | POA: Diagnosis not present

## 2020-02-18 DIAGNOSIS — M25561 Pain in right knee: Secondary | ICD-10-CM | POA: Diagnosis not present

## 2020-02-21 DIAGNOSIS — M25561 Pain in right knee: Secondary | ICD-10-CM | POA: Diagnosis not present

## 2020-02-21 DIAGNOSIS — Z96651 Presence of right artificial knee joint: Secondary | ICD-10-CM | POA: Diagnosis not present

## 2020-02-23 DIAGNOSIS — Z96651 Presence of right artificial knee joint: Secondary | ICD-10-CM | POA: Diagnosis not present

## 2020-02-23 DIAGNOSIS — M25561 Pain in right knee: Secondary | ICD-10-CM | POA: Diagnosis not present

## 2020-02-25 DIAGNOSIS — Z96651 Presence of right artificial knee joint: Secondary | ICD-10-CM | POA: Diagnosis not present

## 2020-02-25 DIAGNOSIS — M25561 Pain in right knee: Secondary | ICD-10-CM | POA: Diagnosis not present

## 2020-02-28 DIAGNOSIS — Z96651 Presence of right artificial knee joint: Secondary | ICD-10-CM | POA: Diagnosis not present

## 2020-02-28 DIAGNOSIS — M25561 Pain in right knee: Secondary | ICD-10-CM | POA: Diagnosis not present

## 2020-03-01 DIAGNOSIS — M25561 Pain in right knee: Secondary | ICD-10-CM | POA: Diagnosis not present

## 2020-03-01 DIAGNOSIS — Z96651 Presence of right artificial knee joint: Secondary | ICD-10-CM | POA: Diagnosis not present

## 2020-03-03 DIAGNOSIS — Z471 Aftercare following joint replacement surgery: Secondary | ICD-10-CM | POA: Diagnosis not present

## 2020-03-03 DIAGNOSIS — M1712 Unilateral primary osteoarthritis, left knee: Secondary | ICD-10-CM | POA: Diagnosis not present

## 2020-03-03 DIAGNOSIS — Z96651 Presence of right artificial knee joint: Secondary | ICD-10-CM | POA: Diagnosis not present

## 2020-03-10 DIAGNOSIS — Z96651 Presence of right artificial knee joint: Secondary | ICD-10-CM | POA: Diagnosis not present

## 2020-03-10 DIAGNOSIS — M25561 Pain in right knee: Secondary | ICD-10-CM | POA: Diagnosis not present

## 2020-03-13 DIAGNOSIS — Z96651 Presence of right artificial knee joint: Secondary | ICD-10-CM | POA: Diagnosis not present

## 2020-03-13 DIAGNOSIS — M25561 Pain in right knee: Secondary | ICD-10-CM | POA: Diagnosis not present

## 2020-03-15 DIAGNOSIS — M25561 Pain in right knee: Secondary | ICD-10-CM | POA: Diagnosis not present

## 2020-03-15 DIAGNOSIS — Z96651 Presence of right artificial knee joint: Secondary | ICD-10-CM | POA: Diagnosis not present

## 2020-03-20 ENCOUNTER — Other Ambulatory Visit: Payer: Medicare Other

## 2020-03-20 NOTE — H&P (Signed)
TOTAL KNEE ADMISSION H&P  Patient is being admitted for left total knee arthroplasty.  Subjective:  Chief Complaint:  Left knee OA / pain  HPI: Teresa Murphy, 70 y.o. female, has a history of pain and functional disability in the left knee due to arthritis and has failed non-surgical conservative treatments for greater than 12 weeks to includeNSAID's and/or analgesics, corticosteriod injections, use of assistive devices and activity modification.  Onset of symptoms was gradual, starting 5+ years ago with gradually worsening course since that time. The patient noted prior procedures on the knee to include  arthroplasty on the right knee.  Patient currently rates pain in the left knee at 10 out of 10 with activity. Patient has night pain, worsening of pain with activity and weight bearing, pain that interferes with activities of daily living, pain with passive range of motion, crepitus and joint swelling.  Patient has evidence of periarticular osteophytes and joint space narrowing by imaging studies.There is no active infection.  Risks, benefits and expectations were discussed with the patient.  Risks including but not limited to the risk of anesthesia, blood clots, nerve damage, blood vessel damage, failure of the prosthesis, infection and up to and including death.  Patient understand the risks, benefits and expectations and wishes to proceed with surgery.   D/C Plans:       Home   Post-op Meds:       No Rx given  Tranexamic Acid:      To be given - IV   Decadron:      Is to be given  FYI:      ASA  Norco  DME:   Pt already has equipment  PT:   OPPT @ [facility]     Patient Active Problem List   Diagnosis Date Noted  . Obese 01/19/2020  . Status post total right knee replacement 01/18/2020  . Osteoarthritis of right knee 01/18/2020  . Pneumonia   . Hypertension   . History of kidney stones   . Depression   . Arthritis   . Anxiety   . Hyperlipidemia 11/25/2017  . CAP (community  acquired pneumonia) 03/20/2016  . Severe obesity (BMI 35.0-39.9) with comorbidity ([place]) 01/11/2016  . Vitamin D deficiency 07/10/2015  . Anxiety disorder 07/10/2015  . Osteoarthritis of both knees 07/10/2015  . Essential hypertension 04/21/2010  . Seasonal and perennial allergic rhinitis 04/19/2010  . Asthma with bronchitis 04/19/2010   Past Medical History:  Diagnosis Date  . Allergic rhinitis   . Anxiety   . Anxiety disorder 07/10/2015  . Arthritis   . Asthma   . Asthma with bronchitis 04/19/2010   PFT 09/18/2010-FEV1  1.95/92; FEV1/FVC 0.76; FEF 25-75% improved 48% with bronchodilator; normal lung volumes; DLCO 78%    . CAP (community acquired pneumonia) 03/20/2016  . Depression   . Essential hypertension 04/21/2010   Qualifier: Diagnosis of  By: Annamaria Boots MD, Clinton D   . History of kidney stones   . Hyperlipidemia 11/25/2017  . Hypertension   . Osteoarthritis of both knees 07/10/2015  . Pneumonia    hx of   . Seasonal and perennial allergic rhinitis 04/19/2010   Allergy Profile 04/19/2010-negative, IgE  3.8 Allergy skin test 11/07/2010-positive for grass, weed, tree pollens, dust mite   . Severe obesity (BMI 35.0-39.9) with comorbidity ([place]) 01/11/2016  . Vitamin D deficiency 07/10/2015    Past Surgical History:  Procedure Laterality Date  . CHOLECYSTECTOMY    . TONSILLECTOMY    . TOTAL ABDOMINAL HYSTERECTOMY    .  TOTAL KNEE ARTHROPLASTY Right 01/18/2020   Procedure: TOTAL KNEE ARTHROPLASTY;  Surgeon: Paralee Cancel, MD;  Location: [facility];  Service: Orthopedics;  Laterality: Right;  70 mins    No current facility-administered medications for this encounter.   Current Outpatient Medications  Medication Sig Dispense Refill Last Dose  . albuterol (PROAIR HFA) 108 (90 Base) MCG/ACT inhaler Inhale 2 puffs into the lungs every 4 (four) hours as needed. (Patient taking differently: Inhale 2 puffs into the lungs every 4 (four) hours as needed for wheezing or shortness of breath.) 1 Inhaler  3   . celecoxib (CELEBREX) 200 MG capsule Take 1 capsule (200 mg total) by mouth 2 (two) times daily. 60 capsule 0   . cetirizine (ZYRTEC) 10 MG tablet Take 10 mg by mouth at bedtime.      . Cinnamon 500 MG capsule Take 500 mg by mouth daily.     . diazepam (VALIUM) 5 MG tablet Take 1 tablet (5 mg total) by mouth daily as needed for anxiety. 30 tablet 1   . docusate sodium (COLACE) 100 MG capsule Take 1 capsule (100 mg total) by mouth 2 (two) times daily. 28 capsule 0   . DULoxetine (CYMBALTA) 60 MG capsule Take 1 capsule (60 mg total) by mouth daily. 90 capsule 2   . ferrous sulfate (FERROUSUL) 325 (65 FE) MG tablet Take 1 tablet (325 mg total) by mouth 3 (three) times daily with meals for 14 days. 42 tablet 0   . Fluticasone-Salmeterol (ADVAIR DISKUS) 250-50 MCG/DOSE AEPB INHALE 1 PUFF INTO LUNGS TWICE A DAY RINSE AFTER USE (Patient taking differently: Inhale 1 puff into the lungs daily as needed (asthma).) 3 each 0   . Glucos-Chond-Hyal Ac-Ca Fructo (MOVE FREE JOINT HEALTH ADVANCE PO) Take 1 tablet by mouth daily.     Marland Kitchen HYDROcodone-acetaminophen (NORCO) 7.5-325 MG tablet Take 1-2 tablets by mouth every 4 (four) hours as needed for moderate pain. 60 tablet 0   . latanoprost (XALATAN) 0.005 % ophthalmic solution Place 1 drop into both eyes at bedtime.  11   . methocarbamol (ROBAXIN) 500 MG tablet Take 1 tablet (500 mg total) by mouth every 6 (six) hours as needed for muscle spasms. 40 tablet 0   . montelukast (SINGULAIR) 10 MG tablet TAKE 1 TABLET BY MOUTH EVERYDAY AT BEDTIME (Patient taking differently: Take 10 mg by mouth at bedtime. TAKE 1 TABLET BY MOUTH EVERYDAY AT BEDTIME) 90 tablet 3   . Multiple Vitamins-Minerals (MULTIVITAMIN WOMEN 50+) TABS Take 1 tablet by mouth daily.     Marland Kitchen olmesartan (BENICAR) 20 MG tablet Take 1 tablet (20 mg total) by mouth daily. 90 tablet 2   . polyethylene glycol (MIRALAX / GLYCOLAX) 17 g packet Take 17 g by mouth 2 (two) times daily. 28 packet 0   . vitamin B-12  (CYANOCOBALAMIN) 500 MCG tablet Take 500 mcg by mouth daily.     . Vitamin D, Ergocalciferol, (DRISDOL) 1.25 MG (50000 UNIT) CAPS capsule TAKE ONE CAPSULE BY MOUTH EVERY 5 DAYS (Patient taking differently: Take 50,000 Units by mouth See admin instructions. TAKE ONE CAPSULE BY MOUTH EVERY 10 DAYS) 18 capsule 2    No Known Allergies  Social History   Tobacco Use  . Smoking status: Never Smoker  . Smokeless tobacco: Never Used  Substance Use Topics  . Alcohol use: No    Alcohol/week: 0.0 standard drinks    Family History  Problem Relation Age of Onset  . Dementia Mother   . Multiple myeloma  Mother   . Lung cancer Father   . Asthma Sister   . Sleep apnea Sister   . Heart disease Other   . Allergic rhinitis Other   . Cancer Other      Review of Systems  Constitutional: Negative.   HENT: Negative.   Eyes: Negative.   Respiratory: Negative.   Cardiovascular: Negative.   Gastrointestinal: Negative.   Genitourinary: Negative.   Musculoskeletal: Positive for joint pain.  Skin: Negative.   Neurological: Negative.   Endo/Heme/Allergies: Positive for environmental allergies.  Psychiatric/Behavioral: Positive for depression. The patient is nervous/anxious.      Objective:  Physical Exam Constitutional:      Appearance: She is well-developed.  HENT:     Head: Normocephalic.  Eyes:     Pupils: Pupils are equal, round, and reactive to light.  Neck:     Thyroid: No thyromegaly.     Vascular: No JVD.     Trachea: No tracheal deviation.  Cardiovascular:     Rate and Rhythm: Normal rate and regular rhythm.     Pulses: Intact distal pulses.  Pulmonary:     Effort: Pulmonary effort is normal. No respiratory distress.     Breath sounds: Normal breath sounds. No wheezing.  Abdominal:     Palpations: Abdomen is soft.     Tenderness: There is no abdominal tenderness. There is no guarding.  Musculoskeletal:     Cervical back: Neck supple.     Left knee: Swelling and bony  tenderness present. No erythema or ecchymosis. Decreased range of motion. Tenderness present.  Lymphadenopathy:     Cervical: No cervical adenopathy.  Skin:    General: Skin is warm and dry.  Neurological:     Mental Status: She is alert and oriented to person, place, and time.  Psychiatric:        Mood and Affect: Mood and affect normal.       Labs:  Estimated body mass index is 37.4 kg/m as calculated from the following:   Height as of 01/18/20: $RemoveBefo'5\' 3"'[id number]$  (1.6 m).   Weight as of 01/18/20: 95.8 kg.   Imaging Review Plain radiographs demonstrate severe degenerative joint disease of the left knee.  The bone quality appears to be good for age and reported activity level.      Assessment/Plan:  End stage arthritis, left knee   The patient history, physical examination, clinical judgment of the provider and imaging studies are consistent with end stage degenerative joint disease of the left knee and total knee arthroplasty is deemed medically necessary. The treatment options including medical management, injection therapy arthroscopy and arthroplasty were discussed at length. The risks and benefits of total knee arthroplasty were presented and reviewed. The risks due to aseptic loosening, infection, stiffness, patella tracking problems, thromboembolic complications and other imponderables were discussed. The patient acknowledged the explanation, agreed to proceed with the plan and consent was signed. Patient is being admitted for treatment for surgery, pain control, PT, OT, prophylactic antibiotics, VTE prophylaxis, progressive ambulation and ADL's and discharge planning. The patient is planning to be discharged home.     Patient's anticipated LOS is less than 2 midnights, meeting these requirements: - Lives within 1 hour of care - Has a competent adult at home to recover with post-op recover - NO history of  - Chronic pain requiring opiods  - Diabetes  - Coronary Artery  Disease  - Heart failure  - Heart attack  - Stroke  - DVT/VTE  - Cardiac arrhythmia  -  Respiratory Failure/COPD  - Renal failure  - Anemia  - Advanced Liver disease    West Pugh. Mearl Olver   PA-C  03/20/2020, 8:46 AM

## 2020-03-27 NOTE — Patient Instructions (Addendum)
DUE TO COVID-19 ONLY ONE VISITOR IS ALLOWED TO COME WITH YOU AND STAY IN THE WAITING ROOM ONLY DURING PRE OP AND PROCEDURE DAY OF SURGERY. THE 1 VISITOR  MAY VISIT WITH YOU AFTER SURGERY IN YOUR PRIVATE ROOM DURING VISITING HOURS ONLY!  YOU NEED TO HAVE A COVID 19 TEST ON: 03/31/20 @ 2:00 PM , THIS TEST MUST BE DONE BEFORE SURGERY,  COVID TESTING SITE [place] [address], IT IS ON THE RIGHT GOING OUT [address] APPROXIMATELY  2 MINUTES PAST [employer] ON THE RIGHT. ONCE YOUR COVID TEST IS COMPLETED,  PLEASE BEGIN THE QUARANTINE INSTRUCTIONS AS OUTLINED IN YOUR HANDOUT.                Teresa Murphy   Your procedure is scheduled on: 04/04/20   Report to [facility] Main  Entrance   Report to admitting at: 10:20 AM    Call this number if you have problems the morning of surgery [phone]    Remember:  NO SOLID FOOD AFTER MIDNIGHT THE NIGHT PRIOR TO SURGERY. NOTHING BY MOUTH EXCEPT CLEAR LIQUIDS UNTIL: 9:50 AM . PLEASE FINISH ENSURE DRINK PER SURGEON ORDER  WHICH NEEDS TO BE COMPLETED AT: 9:50 AM .  CLEAR LIQUID DIET  Foods Allowed                                                                     Foods Excluded  Coffee and tea, regular and decaf                             liquids that you cannot  Plain Jell-O any favor except red or purple                                           see through such as: Fruit ices (not with fruit pulp)                                     milk, soups, orange juice  Iced Popsicles                                    All solid food Carbonated beverages, regular and diet                                    Cranberry, grape and apple juices Sports drinks like Gatorade Lightly seasoned clear broth or consume(fat free) Sugar, honey syrup  Sample Menu Breakfast                                Lunch  Supper Cranberry juice                    Beef broth                             Chicken broth Jell-O                                     Grape juice                           Apple juice Coffee or tea                        Jell-O                                      Popsicle                                                Coffee or tea                        Coffee or tea  _____________________________________________________________________   BRUSH YOUR TEETH MORNING OF SURGERY AND RINSE YOUR MOUTH OUT, NO CHEWING GUM CANDY OR MINTS.   Use eye drops and inhalers as usual.Use diazepam as needed.                               You may not have any metal on your body including hair pins and              piercings  Do not wear jewelry, make-up, lotions, powders or perfumes, deodorant             Do not wear nail polish on your fingernails.  Do not shave  48 hours prior to surgery.    Do not bring valuables to the hospital. [place].  Contacts, dentures or bridgework may not be worn into surgery.  Leave suitcase in the car. After surgery it may be brought to your room.     Patients discharged the day of surgery will not be allowed to drive home. IF YOU ARE HAVING SURGERY AND GOING HOME THE SAME DAY, YOU MUST HAVE AN ADULT TO DRIVE YOU HOME AND BE WITH YOU FOR 24 HOURS. YOU MAY GO HOME BY TAXI OR UBER OR ORTHERWISE, BUT AN ADULT MUST ACCOMPANY YOU HOME AND STAY WITH YOU FOR 24 HOURS.  Name and phone number of your driver:  Special Instructions: N/A              Please read over the following fact sheets you were given: _____________________________________________________________________         [facility] - Preparing for Surgery Before surgery, you can play an important role.  Because skin is not sterile, your skin needs to be as free of germs as possible.  You can reduce the number of  germs on your skin by washing with CHG (chlorahexidine gluconate) soap before surgery.  CHG is an antiseptic cleaner which kills germs  and bonds with the skin to continue killing germs even after washing. Please DO NOT use if you have an allergy to CHG or antibacterial soaps.  If your skin becomes reddened/irritated stop using the CHG and inform your nurse when you arrive at Short Stay. Do not shave (including legs and underarms) for at least 48 hours prior to the first CHG shower.  You may shave your face/neck. Please follow these instructions carefully:  1.  Shower with CHG Soap the night before surgery and the  morning of Surgery.  2.  If you choose to wash your hair, wash your hair first as usual with your  normal  shampoo.  3.  After you shampoo, rinse your hair and body thoroughly to remove the  shampoo.                           4.  Use CHG as you would any other liquid soap.  You can apply chg directly  to the skin and wash                       Gently with a scrungie or clean washcloth.  5.  Apply the CHG Soap to your body ONLY FROM THE NECK DOWN.   Do not use on face/ open                           Wound or open sores. Avoid contact with eyes, ears mouth and genitals (private parts).                       Wash face,  Genitals (private parts) with your normal soap.             6.  Wash thoroughly, paying special attention to the area where your surgery  will be performed.  7.  Thoroughly rinse your body with warm water from the neck down.  8.  DO NOT shower/wash with your normal soap after using and rinsing off  the CHG Soap.                9.  Pat yourself dry with a clean towel.            10.  Wear clean pajamas.            11.  Place clean sheets on your bed the night of your first shower and do not  sleep with pets. Day of Surgery : Do not apply any lotions/deodorants the morning of surgery.  Please wear clean clothes to the hospital/surgery center.  FAILURE TO FOLLOW THESE INSTRUCTIONS MAY RESULT IN THE CANCELLATION OF YOUR SURGERY PATIENT SIGNATURE_________________________________  NURSE  SIGNATURE__________________________________  ________________________________________________________________________   Teresa Murphy  An incentive spirometer is a tool that can help keep your lungs clear and active. This tool measures how well you are filling your lungs with each breath. Taking long deep breaths may help reverse or decrease the chance of developing breathing (pulmonary) problems (especially infection) following:  A long period of time when you are unable to move or be active. BEFORE THE PROCEDURE   If the spirometer includes an indicator to show your best effort, your nurse or respiratory therapist will set it to a desired goal.  If possible, sit up straight or lean slightly forward. Try not to slouch.  Hold the incentive spirometer in an upright position. INSTRUCTIONS FOR USE  1. Sit on the edge of your bed if possible, or sit up as far as you can in bed or on a chair. 2. Hold the incentive spirometer in an upright position. 3. Breathe out normally. 4. Place the mouthpiece in your mouth and seal your lips tightly around it. 5. Breathe in slowly and as deeply as possible, raising the piston or the ball toward the top of the column. 6. Hold your breath for 3-5 seconds or for as long as possible. Allow the piston or ball to fall to the bottom of the column. 7. Remove the mouthpiece from your mouth and breathe out normally. 8. Rest for a few seconds and repeat Steps 1 through 7 at least 10 times every 1-2 hours when you are awake. Take your time and take a few normal breaths between deep breaths. 9. The spirometer may include an indicator to show your best effort. Use the indicator as a goal to work toward during each repetition. 10. After each set of 10 deep breaths, practice coughing to be sure your lungs are clear. If you have an incision (the cut made at the time of surgery), support your incision when coughing by placing a pillow or rolled up towels firmly  against it. Once you are able to get out of bed, walk around indoors and cough well. You may stop using the incentive spirometer when instructed by your caregiver.  RISKS AND COMPLICATIONS  Take your time so you do not get dizzy or light-headed.  If you are in pain, you may need to take or ask for pain medication before doing incentive spirometry. It is harder to take a deep breath if you are having pain. AFTER USE  Rest and breathe slowly and easily.  It can be helpful to keep track of a log of your progress. Your caregiver can provide you with a simple table to help with this. If you are using the spirometer at home, follow these instructions: [place] IF:   You are having difficultly using the spirometer.  You have trouble using the spirometer as often as instructed.  Your pain medication is not giving enough relief while using the spirometer.  You develop fever of 100.5 F (38.1 C) or higher. SEEK IMMEDIATE MEDICAL CARE IF:   You cough up bloody sputum that had not been present before.  You develop fever of 102 F (38.9 C) or greater.  You develop worsening pain at or near the incision site. MAKE SURE YOU:   Understand these instructions.  Will watch your condition.  Will get help right away if you are not doing well or get worse. Document Released: 06/03/2006 Document Revised: 04/15/2011 Document Reviewed: 08/04/2006 [facility] Patient Information 2014 [place], [place].   ________________________________________________________________________

## 2020-03-28 ENCOUNTER — Other Ambulatory Visit: Payer: Self-pay

## 2020-03-28 ENCOUNTER — Encounter (HOSPITAL_COMMUNITY)
Admission: RE | Admit: 2020-03-28 | Discharge: 2020-03-28 | Disposition: A | Discharge status: Home / Self Care | Payer: Medicare Other | Source: Ambulatory Visit | Attending: Orthopedic Surgery | Admitting: Orthopedic Surgery

## 2020-03-28 ENCOUNTER — Encounter (HOSPITAL_COMMUNITY): Payer: Self-pay

## 2020-03-28 DIAGNOSIS — Z01812 Encounter for preprocedural laboratory examination: Secondary | ICD-10-CM | POA: Insufficient documentation

## 2020-03-28 LAB — CBC
HCT: 41.8 % (ref 36.0–46.0)
Hemoglobin: 13.9 g/dL (ref 12.0–15.0)
MCH: 30.3 pg (ref 26.0–34.0)
MCHC: 33.3 g/dL (ref 30.0–36.0)
MCV: 91.3 fL (ref 80.0–100.0)
Platelets: 280 10*3/uL (ref 150–400)
RBC: 4.58 MIL/uL (ref 3.87–5.11)
RDW: 12.2 % (ref 11.5–15.5)
WBC: 7.8 10*3/uL (ref 4.0–10.5)
nRBC: 0 % (ref 0.0–0.2)

## 2020-03-28 LAB — BASIC METABOLIC PANEL
Anion gap: 8 (ref 5–15)
BUN: 18 mg/dL (ref 8–23)
CO2: 28 mmol/L (ref 22–32)
Calcium: 9.4 mg/dL (ref 8.9–10.3)
Chloride: 106 mmol/L (ref 98–111)
Creatinine, Ser: 0.6 mg/dL (ref 0.44–1.00)
GFR, Estimated: >60 mL/min (ref >60)
Glucose, Bld: 95 mg/dL (ref 70–99)
Potassium: 4.3 mmol/L (ref 3.5–5.1)
Sodium: 142 mmol/L (ref 135–145)

## 2020-03-28 LAB — SURGICAL PCR SCREEN
MRSA, PCR: NEGATIVE
Staphylococcus aureus: NEGATIVE

## 2020-03-28 NOTE — Progress Notes (Signed)
COVID Vaccine Completed: Yes Date COVID Vaccine completed: 11/20/19 [place] COVID vaccine manufacturer: Pfizer    PCP - Dr. Betty Martinique Cardiologist - Dr. Shirlee More. LOV: 01/17/20  Chest x-ray -  EKG - 01/17/20 Stress Test -  ECHO -  Cardiac Cath -  Pacemaker/ICD device last checked:  Sleep Study -  CPAP -   Fasting Blood Sugar -  Checks Blood Sugar _____ times a day  Blood Thinner Instructions: Aspirin Instructions: Last Dose:  Anesthesia review: Hx: HTN  Patient denies shortness of breath, fever, cough and chest pain at PAT appointment   Patient verbalized understanding of instructions that were given to them at the PAT appointment. Patient was also instructed that they will need to review over the PAT instructions again at home before surgery.

## 2020-03-31 ENCOUNTER — Other Ambulatory Visit (HOSPITAL_COMMUNITY)
Admission: RE | Admit: 2020-03-31 | Discharge: 2020-03-31 | Disposition: A | Discharge status: Home / Self Care | Payer: Medicare Other | Source: Ambulatory Visit | Attending: Orthopedic Surgery | Admitting: Orthopedic Surgery

## 2020-03-31 DIAGNOSIS — Z01812 Encounter for preprocedural laboratory examination: Secondary | ICD-10-CM | POA: Insufficient documentation

## 2020-03-31 DIAGNOSIS — Z20822 Contact with and (suspected) exposure to covid-19: Secondary | ICD-10-CM | POA: Insufficient documentation

## 2020-03-31 LAB — SARS CORONAVIRUS 2 (TAT 6-24 HRS): SARS Coronavirus 2: NEGATIVE

## 2020-04-04 ENCOUNTER — Other Ambulatory Visit: Payer: Self-pay

## 2020-04-04 ENCOUNTER — Encounter (HOSPITAL_COMMUNITY): Payer: Self-pay | Admitting: Orthopedic Surgery

## 2020-04-04 ENCOUNTER — Ambulatory Visit (HOSPITAL_COMMUNITY): Payer: Medicare Other | Admitting: Certified Registered"

## 2020-04-04 ENCOUNTER — Observation Stay (HOSPITAL_COMMUNITY)
Admission: RE | Admit: 2020-04-04 | Discharge: 2020-04-05 | Disposition: A | Discharge status: Home / Self Care | Payer: Medicare Other | Attending: Orthopedic Surgery | Admitting: Orthopedic Surgery

## 2020-04-04 ENCOUNTER — Encounter (HOSPITAL_COMMUNITY)
Admission: RE | Disposition: A | Discharge status: Home / Self Care | Payer: Self-pay | Source: Home / Self Care | Attending: Orthopedic Surgery

## 2020-04-04 DIAGNOSIS — Z7952 Long term (current) use of systemic steroids: Secondary | ICD-10-CM | POA: Diagnosis not present

## 2020-04-04 DIAGNOSIS — I10 Essential (primary) hypertension: Secondary | ICD-10-CM | POA: Diagnosis not present

## 2020-04-04 DIAGNOSIS — E669 Obesity, unspecified: Secondary | ICD-10-CM | POA: Diagnosis present

## 2020-04-04 DIAGNOSIS — E559 Vitamin D deficiency, unspecified: Secondary | ICD-10-CM | POA: Diagnosis not present

## 2020-04-04 DIAGNOSIS — Z96652 Presence of left artificial knee joint: Secondary | ICD-10-CM

## 2020-04-04 DIAGNOSIS — M1712 Unilateral primary osteoarthritis, left knee: Principal | ICD-10-CM | POA: Diagnosis present

## 2020-04-04 DIAGNOSIS — E785 Hyperlipidemia, unspecified: Secondary | ICD-10-CM | POA: Insufficient documentation

## 2020-04-04 DIAGNOSIS — Z791 Long term (current) use of non-steroidal anti-inflammatories (NSAID): Secondary | ICD-10-CM | POA: Insufficient documentation

## 2020-04-04 DIAGNOSIS — F419 Anxiety disorder, unspecified: Secondary | ICD-10-CM | POA: Insufficient documentation

## 2020-04-04 DIAGNOSIS — Z6835 Body mass index (BMI) 35.0-35.9, adult: Secondary | ICD-10-CM | POA: Diagnosis not present

## 2020-04-04 DIAGNOSIS — G8918 Other acute postprocedural pain: Secondary | ICD-10-CM | POA: Diagnosis not present

## 2020-04-04 DIAGNOSIS — F329 Major depressive disorder, single episode, unspecified: Secondary | ICD-10-CM | POA: Diagnosis not present

## 2020-04-04 HISTORY — PX: TOTAL KNEE ARTHROPLASTY: SHX125

## 2020-04-04 LAB — TYPE AND SCREEN
ABO/RH(D): O POS
Antibody Screen: NEGATIVE

## 2020-04-04 SURGERY — ARTHROPLASTY, KNEE, TOTAL
Anesthesia: Spinal | Site: Knee | Laterality: Left

## 2020-04-04 MED ORDER — ONDANSETRON HCL 4 MG PO TABS: 4.0000 mg | ORAL_TABLET | Freq: Four times a day (QID) | ORAL | Status: DC | PRN

## 2020-04-04 MED ORDER — DULOXETINE HCL 60 MG PO CPEP
60.0000 mg | ORAL_CAPSULE | Freq: Every evening | ORAL | Status: DC
  Administered 2020-04-04: 60 mg via ORAL
  Filled 2020-04-04: qty 1

## 2020-04-04 MED ORDER — ALBUTEROL SULFATE HFA 108 (90 BASE) MCG/ACT IN AERS: 2.0000 | INHALATION_SPRAY | RESPIRATORY_TRACT | Status: DC | PRN

## 2020-04-04 MED ORDER — KETOROLAC TROMETHAMINE 30 MG/ML IJ SOLN
INTRAMUSCULAR | Status: DC | PRN
  Administered 2020-04-04: 30 mg via INTRAVENOUS

## 2020-04-04 MED ORDER — METHOCARBAMOL 500 MG IVPB - SIMPLE MED
500.0000 mg | Freq: Four times a day (QID) | INTRAVENOUS | Status: DC | PRN
  Filled 2020-04-04: qty 50

## 2020-04-04 MED ORDER — TRANEXAMIC ACID-NACL 1000-0.7 MG/100ML-% IV SOLN
1000.0000 mg | Freq: Once | INTRAVENOUS | Status: AC
  Administered 2020-04-04: 1000 mg via INTRAVENOUS
  Filled 2020-04-04: qty 100

## 2020-04-04 MED ORDER — SUGAMMADEX SODIUM 200 MG/2ML IV SOLN
INTRAVENOUS | Status: DC | PRN
  Administered 2020-04-04: 200 mg via INTRAVENOUS

## 2020-04-04 MED ORDER — HYDROMORPHONE HCL 1 MG/ML IJ SOLN: 0.5000 mg | INTRAMUSCULAR | Status: DC | PRN

## 2020-04-04 MED ORDER — KETAMINE HCL 10 MG/ML IJ SOLN
INTRAMUSCULAR | Status: DC | PRN
  Administered 2020-04-04: 30 mg via INTRAVENOUS
  Administered 2020-04-04 (×2): 20 mg via INTRAVENOUS

## 2020-04-04 MED ORDER — ROCURONIUM BROMIDE 10 MG/ML (PF) SYRINGE
PREFILLED_SYRINGE | INTRAVENOUS | Status: AC
  Filled 2020-04-04: qty 10

## 2020-04-04 MED ORDER — MONTELUKAST SODIUM 10 MG PO TABS
10.0000 mg | ORAL_TABLET | Freq: Every day | ORAL | Status: DC
  Administered 2020-04-04: 10 mg via ORAL
  Filled 2020-04-04: qty 1

## 2020-04-04 MED ORDER — LACTATED RINGERS IV SOLN: INTRAVENOUS | Status: DC

## 2020-04-04 MED ORDER — MIDAZOLAM HCL 2 MG/2ML IJ SOLN
1.0000 mg | INTRAMUSCULAR | Status: DC
  Administered 2020-04-04: 1 mg via INTRAVENOUS
  Filled 2020-04-04: qty 2

## 2020-04-04 MED ORDER — BUPIVACAINE-EPINEPHRINE (PF) 0.25% -1:200000 IJ SOLN
INTRAMUSCULAR | Status: DC | PRN
  Administered 2020-04-04: 30 mL

## 2020-04-04 MED ORDER — BUPIVACAINE-EPINEPHRINE (PF) 0.25% -1:200000 IJ SOLN
INTRAMUSCULAR | Status: AC
  Filled 2020-04-04: qty 30

## 2020-04-04 MED ORDER — ACETAMINOPHEN 325 MG PO TABS: 325.0000 mg | ORAL_TABLET | Freq: Four times a day (QID) | ORAL | Status: DC | PRN

## 2020-04-04 MED ORDER — LIDOCAINE HCL (PF) 2 % IJ SOLN
INTRAMUSCULAR | Status: AC
  Filled 2020-04-04: qty 5

## 2020-04-04 MED ORDER — CHLORHEXIDINE GLUCONATE 0.12 % MT SOLN: 15.0000 mL | Freq: Once | OROMUCOSAL | Status: DC

## 2020-04-04 MED ORDER — MIDAZOLAM HCL 2 MG/2ML IJ SOLN
INTRAMUSCULAR | Status: AC
  Filled 2020-04-04: qty 2

## 2020-04-04 MED ORDER — MENTHOL 3 MG MT LOZG: 1.0000 | LOZENGE | OROMUCOSAL | Status: DC | PRN

## 2020-04-04 MED ORDER — ONDANSETRON HCL 4 MG/2ML IJ SOLN
INTRAMUSCULAR | Status: AC
  Filled 2020-04-04: qty 2

## 2020-04-04 MED ORDER — LABETALOL HCL 5 MG/ML IV SOLN
INTRAVENOUS | Status: DC | PRN
  Administered 2020-04-04: 5 mg via INTRAVENOUS

## 2020-04-04 MED ORDER — HYDROMORPHONE HCL 1 MG/ML IJ SOLN
INTRAMUSCULAR | Status: AC
  Administered 2020-04-04: 0.5 mg via INTRAVENOUS
  Filled 2020-04-04: qty 1

## 2020-04-04 MED ORDER — DEXAMETHASONE SODIUM PHOSPHATE 10 MG/ML IJ SOLN
10.0000 mg | Freq: Once | INTRAMUSCULAR | Status: AC
  Administered 2020-04-05: 10 mg via INTRAVENOUS
  Filled 2020-04-04: qty 1

## 2020-04-04 MED ORDER — CELECOXIB 200 MG PO CAPS
200.0000 mg | ORAL_CAPSULE | Freq: Two times a day (BID) | ORAL | Status: DC
  Administered 2020-04-04 – 2020-04-05 (×2): 200 mg via ORAL
  Filled 2020-04-04 (×2): qty 1

## 2020-04-04 MED ORDER — LABETALOL HCL 5 MG/ML IV SOLN
INTRAVENOUS | Status: AC
  Filled 2020-04-04: qty 4

## 2020-04-04 MED ORDER — METHOCARBAMOL 500 MG PO TABS
500.0000 mg | ORAL_TABLET | Freq: Four times a day (QID) | ORAL | Status: DC | PRN
  Administered 2020-04-05: 500 mg via ORAL
  Filled 2020-04-04: qty 1

## 2020-04-04 MED ORDER — PROPOFOL 10 MG/ML IV BOLUS
INTRAVENOUS | Status: DC | PRN
  Administered 2020-04-04: 150 mg via INTRAVENOUS

## 2020-04-04 MED ORDER — ORAL CARE MOUTH RINSE: 15.0000 mL | Freq: Once | OROMUCOSAL | Status: DC

## 2020-04-04 MED ORDER — HYDROMORPHONE HCL 1 MG/ML IJ SOLN
INTRAMUSCULAR | Status: DC | PRN
  Administered 2020-04-04 (×3): .4 mg via INTRAVENOUS

## 2020-04-04 MED ORDER — DEXAMETHASONE SODIUM PHOSPHATE 10 MG/ML IJ SOLN
INTRAMUSCULAR | Status: AC
  Filled 2020-04-04: qty 1

## 2020-04-04 MED ORDER — HYDROCODONE-ACETAMINOPHEN 5-325 MG PO TABS
1.0000 | ORAL_TABLET | ORAL | Status: DC | PRN
  Administered 2020-04-04: 1 via ORAL
  Administered 2020-04-04 – 2020-04-05 (×4): 2 via ORAL
  Filled 2020-04-04 (×5): qty 2

## 2020-04-04 MED ORDER — SODIUM CHLORIDE 0.9 % IR SOLN
Status: DC | PRN
  Administered 2020-04-04: 3000 mL

## 2020-04-04 MED ORDER — ONDANSETRON HCL 4 MG/2ML IJ SOLN
INTRAMUSCULAR | Status: DC | PRN
  Administered 2020-04-04: 4 mg via INTRAVENOUS

## 2020-04-04 MED ORDER — FERROUS SULFATE 325 (65 FE) MG PO TABS
325.0000 mg | ORAL_TABLET | Freq: Two times a day (BID) | ORAL | Status: DC
  Administered 2020-04-04 – 2020-04-05 (×2): 325 mg via ORAL
  Filled 2020-04-04 (×2): qty 1

## 2020-04-04 MED ORDER — POVIDONE-IODINE 10 % EX SWAB: 2.0000 "application " | Freq: Once | CUTANEOUS | Status: DC

## 2020-04-04 MED ORDER — DOCUSATE SODIUM 100 MG PO CAPS
100.0000 mg | ORAL_CAPSULE | Freq: Two times a day (BID) | ORAL | Status: DC
  Administered 2020-04-04 – 2020-04-05 (×2): 100 mg via ORAL
  Filled 2020-04-04 (×2): qty 1

## 2020-04-04 MED ORDER — DEXAMETHASONE SODIUM PHOSPHATE 10 MG/ML IJ SOLN: 10.0000 mg | Freq: Once | INTRAMUSCULAR | Status: DC

## 2020-04-04 MED ORDER — ONDANSETRON HCL 4 MG/2ML IJ SOLN: 4.0000 mg | Freq: Four times a day (QID) | INTRAMUSCULAR | Status: DC | PRN

## 2020-04-04 MED ORDER — STERILE WATER FOR IRRIGATION IR SOLN
Status: DC | PRN
  Administered 2020-04-04: 2000 mL

## 2020-04-04 MED ORDER — SODIUM CHLORIDE (PF) 0.9 % IJ SOLN
INTRAMUSCULAR | Status: AC
  Filled 2020-04-04: qty 30

## 2020-04-04 MED ORDER — METOCLOPRAMIDE HCL 5 MG PO TABS: 5.0000 mg | ORAL_TABLET | Freq: Three times a day (TID) | ORAL | Status: DC | PRN

## 2020-04-04 MED ORDER — LATANOPROST 0.005 % OP SOLN
1.0000 [drp] | Freq: Every day | OPHTHALMIC | Status: DC
  Administered 2020-04-04: 1 [drp] via OPHTHALMIC
  Filled 2020-04-04: qty 2.5

## 2020-04-04 MED ORDER — FENTANYL CITRATE (PF) 250 MCG/5ML IJ SOLN
INTRAMUSCULAR | Status: DC | PRN
  Administered 2020-04-04 (×5): 50 ug via INTRAVENOUS

## 2020-04-04 MED ORDER — OXYCODONE HCL 5 MG PO TABS: 5.0000 mg | ORAL_TABLET | Freq: Once | ORAL | Status: DC | PRN

## 2020-04-04 MED ORDER — METOCLOPRAMIDE HCL 5 MG/ML IJ SOLN: 5.0000 mg | Freq: Three times a day (TID) | INTRAMUSCULAR | Status: DC | PRN

## 2020-04-04 MED ORDER — BUPIVACAINE HCL (PF) 0.5 % IJ SOLN
INTRAMUSCULAR | Status: DC | PRN
  Administered 2020-04-04: 20 mL via PERINEURAL

## 2020-04-04 MED ORDER — KETAMINE HCL 10 MG/ML IJ SOLN
INTRAMUSCULAR | Status: AC
  Filled 2020-04-04: qty 1

## 2020-04-04 MED ORDER — ROCURONIUM BROMIDE 10 MG/ML (PF) SYRINGE
PREFILLED_SYRINGE | INTRAVENOUS | Status: DC | PRN
  Administered 2020-04-04: 70 mg via INTRAVENOUS

## 2020-04-04 MED ORDER — TRANEXAMIC ACID-NACL 1000-0.7 MG/100ML-% IV SOLN
1000.0000 mg | INTRAVENOUS | Status: AC
  Administered 2020-04-04: 1000 mg via INTRAVENOUS
  Filled 2020-04-04: qty 100

## 2020-04-04 MED ORDER — OXYCODONE HCL 5 MG/5ML PO SOLN: 5.0000 mg | Freq: Once | ORAL | Status: DC | PRN

## 2020-04-04 MED ORDER — MAGNESIUM CITRATE PO SOLN: 1.0000 | Freq: Once | ORAL | Status: DC | PRN

## 2020-04-04 MED ORDER — ASPIRIN 81 MG PO CHEW
81.0000 mg | CHEWABLE_TABLET | Freq: Two times a day (BID) | ORAL | Status: DC
  Administered 2020-04-04 – 2020-04-05 (×2): 81 mg via ORAL
  Filled 2020-04-04 (×2): qty 1

## 2020-04-04 MED ORDER — HYDROMORPHONE HCL 2 MG/ML IJ SOLN
INTRAMUSCULAR | Status: AC
  Filled 2020-04-04: qty 1

## 2020-04-04 MED ORDER — SODIUM CHLORIDE (PF) 0.9 % IJ SOLN
INTRAMUSCULAR | Status: AC
  Filled 2020-04-04: qty 10

## 2020-04-04 MED ORDER — LACTATED RINGERS IV SOLN: INTRAVENOUS | Status: DC | PRN

## 2020-04-04 MED ORDER — DIPHENHYDRAMINE HCL 12.5 MG/5ML PO ELIX: 12.5000 mg | ORAL_SOLUTION | ORAL | Status: DC | PRN

## 2020-04-04 MED ORDER — 0.9 % SODIUM CHLORIDE (POUR BTL) OPTIME
TOPICAL | Status: DC | PRN
  Administered 2020-04-04: 1000 mL

## 2020-04-04 MED ORDER — DIAZEPAM 5 MG PO TABS: 5.0000 mg | ORAL_TABLET | Freq: Every day | ORAL | Status: DC | PRN

## 2020-04-04 MED ORDER — SODIUM CHLORIDE 0.9 % IV SOLN: INTRAVENOUS | Status: DC

## 2020-04-04 MED ORDER — PHENOL 1.4 % MT LIQD: 1.0000 | OROMUCOSAL | Status: DC | PRN

## 2020-04-04 MED ORDER — ALUM & MAG HYDROXIDE-SIMETH 200-200-20 MG/5ML PO SUSP: 15.0000 mL | ORAL | Status: DC | PRN

## 2020-04-04 MED ORDER — MOMETASONE FURO-FORMOTEROL FUM 200-5 MCG/ACT IN AERO: 2.0000 | INHALATION_SPRAY | Freq: Two times a day (BID) | RESPIRATORY_TRACT | Status: DC | PRN

## 2020-04-04 MED ORDER — CEFAZOLIN SODIUM-DEXTROSE 2-4 GM/100ML-% IV SOLN
2.0000 g | Freq: Four times a day (QID) | INTRAVENOUS | Status: AC
  Administered 2020-04-04 (×2): 2 g via INTRAVENOUS
  Filled 2020-04-04 (×2): qty 100

## 2020-04-04 MED ORDER — HYDROCODONE-ACETAMINOPHEN 7.5-325 MG PO TABS: 1.0000 | ORAL_TABLET | ORAL | Status: DC | PRN

## 2020-04-04 MED ORDER — LORATADINE 10 MG PO TABS
10.0000 mg | ORAL_TABLET | Freq: Every day | ORAL | Status: DC
  Administered 2020-04-05: 10 mg via ORAL
  Filled 2020-04-04: qty 1

## 2020-04-04 MED ORDER — HYDROMORPHONE HCL 1 MG/ML IJ SOLN: 0.2500 mg | INTRAMUSCULAR | Status: DC | PRN

## 2020-04-04 MED ORDER — ONDANSETRON HCL 4 MG/2ML IJ SOLN: 4.0000 mg | Freq: Once | INTRAMUSCULAR | Status: DC | PRN

## 2020-04-04 MED ORDER — PHENYLEPHRINE HCL (PRESSORS) 10 MG/ML IV SOLN
INTRAVENOUS | Status: AC
  Filled 2020-04-04: qty 1

## 2020-04-04 MED ORDER — CEFAZOLIN SODIUM-DEXTROSE 2-4 GM/100ML-% IV SOLN
2.0000 g | INTRAVENOUS | Status: AC
  Administered 2020-04-04: 2 g via INTRAVENOUS
  Filled 2020-04-04: qty 100

## 2020-04-04 MED ORDER — LIDOCAINE 2% (20 MG/ML) 5 ML SYRINGE
INTRAMUSCULAR | Status: DC | PRN
  Administered 2020-04-04: 100 mg via INTRAVENOUS

## 2020-04-04 MED ORDER — POLYETHYLENE GLYCOL 3350 17 G PO PACK
17.0000 g | PACK | Freq: Two times a day (BID) | ORAL | Status: DC
  Administered 2020-04-04 – 2020-04-05 (×2): 17 g via ORAL
  Filled 2020-04-04 (×2): qty 1

## 2020-04-04 MED ORDER — KETOROLAC TROMETHAMINE 30 MG/ML IJ SOLN
INTRAMUSCULAR | Status: AC
  Filled 2020-04-04: qty 1

## 2020-04-04 MED ORDER — IRBESARTAN 150 MG PO TABS
150.0000 mg | ORAL_TABLET | Freq: Every day | ORAL | Status: DC
  Administered 2020-04-05: 150 mg via ORAL
  Filled 2020-04-04: qty 1

## 2020-04-04 MED ORDER — SODIUM CHLORIDE (PF) 0.9 % IJ SOLN
INTRAMUSCULAR | Status: DC | PRN
  Administered 2020-04-04: 30 mL

## 2020-04-04 MED ORDER — FENTANYL CITRATE (PF) 100 MCG/2ML IJ SOLN
50.0000 ug | INTRAMUSCULAR | Status: DC
  Administered 2020-04-04: 50 ug via INTRAVENOUS
  Filled 2020-04-04: qty 2

## 2020-04-04 MED ORDER — DEXAMETHASONE SODIUM PHOSPHATE 10 MG/ML IJ SOLN
INTRAMUSCULAR | Status: DC | PRN
  Administered 2020-04-04: 10 mg via INTRAVENOUS

## 2020-04-04 MED ORDER — BISACODYL 10 MG RE SUPP: 10.0000 mg | Freq: Every day | RECTAL | Status: DC | PRN

## 2020-04-04 MED ORDER — FENTANYL CITRATE (PF) 250 MCG/5ML IJ SOLN
INTRAMUSCULAR | Status: AC
  Filled 2020-04-04: qty 5

## 2020-04-04 SURGICAL SUPPLY — 58 items
ADH SKN CLS APL DERMABOND .7 (GAUZE/BANDAGES/DRESSINGS) ×1
ATTUNE MED ANAT PAT 35 KNEE (Knees) ×1 IMPLANT
ATTUNE PSFEM LTSZ5 NARCEM KNEE (Femur) ×1 IMPLANT
ATTUNE PSRP INSR SZ5 5 KNEE (Insert) ×1 IMPLANT
BAG SPEC THK2 15X12 ZIP CLS (MISCELLANEOUS)
BAG ZIPLOCK 12X15 (MISCELLANEOUS) IMPLANT
BASEPLATE TIBIAL ROTATING SZ 4 (Knees) ×1 IMPLANT
BLADE SAW SGTL 11.0X1.19X90.0M (BLADE) IMPLANT
BLADE SAW SGTL 13.0X1.19X90.0M (BLADE) ×2 IMPLANT
BLADE SURG SZ10 CARB STEEL (BLADE) ×4 IMPLANT
BNDG ELASTIC 6X5.8 VLCR STR LF (GAUZE/BANDAGES/DRESSINGS) ×2 IMPLANT
BOWL SMART MIX CTS (DISPOSABLE) ×2 IMPLANT
BSPLAT TIB 4 CMNT ROT PLAT STR (Knees) ×1 IMPLANT
CEMENT HV SMART SET (Cement) ×2 IMPLANT
COVER WAND RF STERILE (DRAPES) IMPLANT
CUFF TOURN SGL QUICK 34 (TOURNIQUET CUFF) ×2
CUFF TRNQT CYL 34X4.125X (TOURNIQUET CUFF) ×1 IMPLANT
DECANTER SPIKE VIAL GLASS SM (MISCELLANEOUS) ×4 IMPLANT
DERMABOND ADVANCED (GAUZE/BANDAGES/DRESSINGS) ×1
DERMABOND ADVANCED .7 DNX12 (GAUZE/BANDAGES/DRESSINGS) ×1 IMPLANT
DRAPE U-SHAPE 47X51 STRL (DRAPES) ×2 IMPLANT
DRESSING AQUACEL AG SP 3.5X10 (GAUZE/BANDAGES/DRESSINGS) ×1 IMPLANT
DRSG AQUACEL AG ADV 3.5X10 (GAUZE/BANDAGES/DRESSINGS) ×1 IMPLANT
DRSG AQUACEL AG SP 3.5X10 (GAUZE/BANDAGES/DRESSINGS) ×2
DURAPREP 26ML APPLICATOR (WOUND CARE) ×4 IMPLANT
ELECT REM PT RETURN 15FT ADLT (MISCELLANEOUS) ×2 IMPLANT
GLOVE ORTHO TXT STRL SZ7.5 (GLOVE) ×2 IMPLANT
GLOVE SURG ENC MOIS LTX SZ6 (GLOVE) IMPLANT
GLOVE SURG LTX SZ8 (GLOVE) ×2 IMPLANT
GLOVE SURG UNDER POLY LF SZ6.5 (GLOVE) IMPLANT
GLOVE SURG UNDER POLY LF SZ7.5 (GLOVE) ×2 IMPLANT
GLOVE SURG UNDER POLY LF SZ8.5 (GLOVE) IMPLANT
GOWN STRL REUS W/TWL 2XL LVL3 (GOWN DISPOSABLE) IMPLANT
GOWN STRL REUS W/TWL LRG LVL3 (GOWN DISPOSABLE) ×2 IMPLANT
HANDPIECE INTERPULSE COAX TIP (DISPOSABLE) ×2
HOLDER FOLEY CATH W/STRAP (MISCELLANEOUS) IMPLANT
KIT TURNOVER KIT A (KITS) ×2 IMPLANT
MANIFOLD NEPTUNE II (INSTRUMENTS) ×2 IMPLANT
NDL SAFETY ECLIPSE 18X1.5 (NEEDLE) IMPLANT
NEEDLE HYPO 18GX1.5 SHARP (NEEDLE)
NS IRRIG 1000ML POUR BTL (IV SOLUTION) ×2 IMPLANT
PACK TOTAL KNEE CUSTOM (KITS) ×2 IMPLANT
PENCIL SMOKE EVACUATOR (MISCELLANEOUS) IMPLANT
PIN DRILL FIX HALF THREAD (BIT) ×1 IMPLANT
PIN FIX SIGMA LCS THRD HI (PIN) ×1 IMPLANT
PROTECTOR NERVE ULNAR (MISCELLANEOUS) ×2 IMPLANT
SET HNDPC FAN SPRY TIP SCT (DISPOSABLE) ×1 IMPLANT
SET PAD KNEE POSITIONER (MISCELLANEOUS) ×2 IMPLANT
SUT MNCRL AB 4-0 PS2 18 (SUTURE) ×2 IMPLANT
SUT STRATAFIX PDS+ 0 24IN (SUTURE) ×2 IMPLANT
SUT VIC AB 1 CT1 36 (SUTURE) ×2 IMPLANT
SUT VIC AB 2-0 CT1 27 (SUTURE) ×6
SUT VIC AB 2-0 CT1 TAPERPNT 27 (SUTURE) ×3 IMPLANT
SYR 3ML LL SCALE MARK (SYRINGE) ×2 IMPLANT
TRAY CATH INTERMITTENT SS 16FR (CATHETERS) ×2 IMPLANT
TRAY FOLEY MTR SLVR 16FR STAT (SET/KITS/TRAYS/PACK) ×2 IMPLANT
WATER STERILE IRR 1000ML POUR (IV SOLUTION) ×4 IMPLANT
WRAP KNEE MAXI GEL POST OP (GAUZE/BANDAGES/DRESSINGS) ×2 IMPLANT

## 2020-04-04 NOTE — Discharge Instructions (Signed)

## 2020-04-04 NOTE — Transfer of Care (Signed)
Immediate Anesthesia Transfer of Care Note  Patient: Teresa Murphy  Procedure(s) Performed: TOTAL KNEE ARTHROPLASTY (Left Knee)  Patient Location: PACU  Anesthesia Type:GA combined with regional for post-op pain  Level of Consciousness: drowsy  Airway & Oxygen Therapy: Patient Spontanous Breathing and Patient connected to face mask oxygen  Post-op Assessment: Report given to RN and Post -op Vital signs reviewed and stable  Post vital signs: Reviewed and stable  Last Vitals:  Vitals Value Taken Time  BP    Temp    Pulse 89 04/04/20 1336  Resp 19 04/04/20 1336  SpO2 96 % 04/04/20 1336  Vitals shown include unvalidated device data.  Last Pain:  Vitals:   04/04/20 1114  TempSrc: Oral  PainSc:       Patients Stated Pain Goal: 4 (78/93/81 0175)  Complications: No complications documented.

## 2020-04-04 NOTE — Anesthesia Procedure Notes (Signed)
Anesthesia Regional Block: Adductor canal block   Pre-Anesthetic Checklist: ,, timeout performed, Correct Patient, Correct Site, Correct Laterality, Correct Procedure, Correct Position, site marked, Risks and benefits discussed,  Surgical consent,  Pre-op evaluation,  At surgeon's request and post-op pain management  Laterality: Left  Prep: chloraprep       Needles:  Injection technique: Single-shot  Needle Type: Echogenic Needle     Needle Length: 9cm      Additional Needles:   Procedures:,,,, ultrasound used (permanent image in chart),,,,  Narrative:  Start time: 04/04/2020 11:17 AM End time: 04/04/2020 11:27 AM Injection made incrementally with aspirations every 5 mL.  Performed by: Personally  Anesthesiologist: Myrtie Soman, MD  Additional Notes: Patient tolerated the procedure well without complications

## 2020-04-04 NOTE — Interval H&P Note (Signed)
History and Physical Interval Note:  04/04/2020 11:03 AM  Teresa Murphy  has presented today for surgery, with the diagnosis of Left knee osteoarthritis.  The various methods of treatment have been discussed with the patient and family. After consideration of risks, benefits and other options for treatment, the patient has consented to  Procedure(s) with comments: TOTAL KNEE ARTHROPLASTY (Left) - 70 mins as a surgical intervention.  The patient's history has been reviewed, patient examined, no change in status, stable for surgery.  I have reviewed the patient's chart and labs.  Questions were answered to the patient's satisfaction.     Mauri Pole

## 2020-04-04 NOTE — Op Note (Signed)
NAME:  Teresa Murphy                      MEDICAL RECORD NO.:  [id number]                             FACILITY:  [facility]      PHYSICIAN:  Pietro Cassis. Alvan Dame, M.D.  DATE OF BIRTH:  1950-10-19      DATE OF PROCEDURE:  04/04/2020                                     OPERATIVE REPORT         PREOPERATIVE DIAGNOSIS:  Left knee osteoarthritis.      POSTOPERATIVE DIAGNOSIS:  Left knee osteoarthritis.      FINDINGS:  The patient was noted to have complete loss of cartilage and   bone-on-bone arthritis with associated osteophytes in the medial and patellofemoral compartments of   the knee with a significant synovitis and associated effusion.  The patient had failed months of conservative treatment including medications, injection therapy, activity modification.     PROCEDURE:  Left total knee replacement.      COMPONENTS USED:  DePuy Attune rotating platform posterior stabilized knee   system, a size 5N femur, 4 tibia, size size 5 mm PS AOX insert, and 35 anatomic patellar   button.      SURGEON:  Pietro Cassis. Alvan Dame, M.D.      ASSISTANT:  Danae Orleans, PA-C.      ANESTHESIA:  General and Regional.      SPECIMENS:  None.      COMPLICATION:  None.      DRAINS:  None.  EBL: <300cc      TOURNIQUET TIME:  28 min at 250 mmHg     The patient was stable to the recovery room.      INDICATION FOR PROCEDURE:  Teresa Murphy is a 70 y.o. female patient of   mine.  The patient had been seen, evaluated, and treated for months conservatively in the   office with medication, activity modification, and injections.  The patient had   radiographic changes of bone-on-bone arthritis with endplate sclerosis and osteophytes noted.  Based on the radiographic changes and failed conservative measures, the patient   decided to proceed with definitive treatment, total knee replacement.  Risks of infection, DVT, component failure, need for revision surgery, neurovascular injury were reviewed in the office  setting.  The postop course was reviewed stressing the efforts to maximize post-operative satisfaction and function.  Consent was obtained for benefit of pain   relief.      PROCEDURE IN DETAIL:  The patient was brought to the operative theater.   Once adequate anesthesia, preoperative antibiotics, 2 gm of Ancef,1 gm of Tranexamic Acid, and 10 mg of Decadron administered, the patient was positioned supine with a left thigh tourniquet placed.  The  left lower extremity was prepped and draped in sterile fashion.  A time-   out was performed identifying the patient, planned procedure, and the appropriate extremity.      The left lower extremity was placed in the [facility] leg holder.  The leg was   exsanguinated, tourniquet elevated to 250 mmHg.  A midline incision was   made followed by median parapatellar arthrotomy.  Following initial   exposure, attention was  first directed to the patella.  Precut   measurement was noted to be 21 mm.  I resected down to 13 mm and used a   35 anatomic patellar button to restore patellar height as well as cover the cut surface.      The lug holes were drilled and a metal shim was placed to protect the   patella from retractors and saw blade during the procedure.      At this point, attention was now directed to the femur.  The femoral   canal was opened with a drill, irrigated to try to prevent fat emboli.  An   intramedullary rod was passed at 3 degrees valgus, 9 mm of bone was   resected off the distal femur.  Following this resection, the tibia was   subluxated anteriorly.  Using the extramedullary guide, 2 mm of bone was resected off   the proximal medial tibia.  We confirmed the gap would be   stable medially and laterally with a size 5 spacer block as well as confirmed that the tibial cut was perpendicular in the coronal plane, checking with an alignment rod.      Once this was done, I sized the femur to be a size 5 in the anterior-   posterior  dimension, chose a narr0w component based on medial and   lateral dimension.  The size 5 rotation block was then pinned in   position anterior referenced using the C-clamp to set rotation.  The   anterior, posterior, and  chamfer cuts were made without difficulty nor   notching making certain that I was along the anterior cortex to help   with flexion gap stability.      The final box cut was made off the lateral aspect of distal femur.      At this point, the tibia was sized to be a size 4.  The size 4 tray was   then pinned in position through the medial third of the tubercle,   drilled, and keel punched.  Trial reduction was now carried with a 5 femur,  4 tibia, a size size 5 mm PS insert, and the 35 anatomic patella botton.  The knee was brought to full extension with good flexion stability with the patella   tracking through the trochlea without application of pressure.  Given   all these findings the trial components removed.  Final components were   opened and cement was mixed.  The knee was irrigated with normal saline solution and pulse lavage.  The synovial lining was   then injected with 30 cc of 0.25% Marcaine with epinephrine, 1 cc of Toradol and 30 cc of NS for a total of 61 cc.     Final implants were then cemented onto cleaned and dried cut surfaces of bone with the knee brought to extension with a size 5 mm PS trial insert.      Once the cement had fully cured, excess cement was removed   throughout the knee.  I confirmed that I was satisfied with the range of   motion and stability, and the final size 5 mm PS AOX insert was chosen.  It was   placed into the knee.      The tourniquet had been let down at 28 minutes.  No significant   hemostasis was required.  The extensor mechanism was then reapproximated using #1 Vicryl and #1 Stratafix sutures with the knee   in flexion.  The  remaining wound was closed with 2-0 Vicryl and running 4-0 Monocryl.   The knee was cleaned,  dried, dressed sterilely using Dermabond and   Aquacel dressing.  The patient was then   brought to recovery room in stable condition, tolerating the procedure   well.   Please note that Physician Assistant, Danae Orleans, PA-C was present for the entirety of the case, and was utilized for pre-operative positioning, peri-operative retractor management, general facilitation of the procedure and for primary wound closure at the end of the case.              Pietro Cassis Alvan Dame, M.D.    04/04/2020 11:08 AM

## 2020-04-04 NOTE — Care Plan (Signed)
Ortho Bundle Case Management Note  Patient Details  Name: Teresa Murphy MRN: [id number] Date of Birth: 01-05-1951                  L TKA on 04/04/20. DCP: Home with sister. 2 story home with 1 step. DME: No needs. Has RW & 3in1. PT: EO 3/4 then [facility] 3/18   DME Arranged:  N/A DME Agency:     HH Arranged:    [place] Agency:     Additional Comments: Please contact me with any questions of if this plan should need to change.  Marianne Sofia, RN,CCM [facility]  [phone] 04/04/2020, 1:04 PM

## 2020-04-04 NOTE — Progress Notes (Signed)
Assisted Dr. Rose with left, ultrasound guided, adductor canal block. Side rails up, monitors on throughout procedure. See vital signs in flow sheet. Tolerated Procedure well.  

## 2020-04-04 NOTE — Anesthesia Preprocedure Evaluation (Signed)
Anesthesia Evaluation  Patient identified by MRN, date of birth, ID band Patient awake    Reviewed: Allergy & Precautions, H&P , NPO status , Patient's Chart, lab work & pertinent test results  Airway Mallampati: II  TM Distance: >3 FB Neck ROM: Full    Dental no notable dental hx.    Pulmonary asthma ,    Pulmonary exam normal breath sounds clear to auscultation       Cardiovascular hypertension, Pt. on medications Normal cardiovascular exam Rhythm:Regular Rate:Normal     Neuro/Psych negative neurological ROS  negative psych ROS   GI/Hepatic negative GI ROS, Neg liver ROS,   Endo/Other  obesity  Renal/GU negative Renal ROS  negative genitourinary   Musculoskeletal  (+) Arthritis ,   Abdominal   Peds negative pediatric ROS (+)  Hematology negative hematology ROS (+)   Anesthesia Other Findings   Reproductive/Obstetrics negative OB ROS                             Anesthesia Physical Anesthesia Plan  ASA: II  Anesthesia Plan: Spinal   Post-op Pain Management:  Regional for Post-op pain   Induction: Intravenous  PONV Risk Score and Plan: 2 and Ondansetron, Dexamethasone and Treatment may vary due to age or medical condition  Airway Management Planned: Simple Face Mask  Additional Equipment:   Intra-op Plan:   Post-operative Plan:   Informed Consent: I have reviewed the patients History and Physical, chart, labs and discussed the procedure including the risks, benefits and alternatives for the proposed anesthesia with the patient or authorized representative who has indicated his/her understanding and acceptance.     Dental advisory given  Plan Discussed with: CRNA and Surgeon  Anesthesia Plan Comments:         Anesthesia Quick Evaluation

## 2020-04-04 NOTE — Evaluation (Signed)
Physical Therapy Evaluation Patient Details Name: Teresa Murphy MRN: [id number] DOB: October 11, 1950 Today's Date: 04/04/2020   History of Present Illness  Patient is a 70 y.o. female s/p Lt TKA on 04/04/2020 with PMH significant for severe obesity, OA, HTN, HLD, depression, anxiety, pneumonia, asthma, and Rt TKA (2021).  Clinical Impression  Pt is a 70y.o. female s/p Lt TKA POD 0. Pt reports that she is modified independent with use of SPC for mobility at baseline. Pt required MIN assist for safety and power up for sit to stand transfers. Pt required MIN assist for ambulation 18ft with verbal cues for RW management and step to gait pattern with no LOB or knee buckling. PT reviewed therapeutic intervention for promotion of DVT prevention, pt demonstrated understanding. Pt will have assist from her sister upon discharge. Pt will benefit from skilled PT to increase independence and safety with mobility. Acute therapy to follow up and progress functional mobility as able to ensure safe discharge home.      Follow Up Recommendations Follow surgeon's recommendation for DC plan and follow-up therapies;Home health PT    Equipment Recommendations  None recommended by PT    Recommendations for Other Services       Precautions / Restrictions Precautions Precautions: Fall Restrictions Weight Bearing Restrictions: No Other Position/Activity Restrictions: WBAT      Mobility  Bed Mobility Overal bed mobility: Needs Assistance Bed Mobility: Supine to Sit     Supine to sit: Supervision;HOB elevated     General bed mobility comments: pt with use of bed rails and B UEs to scoot to EOB with supervision for safety.    Transfers Overall transfer level: Needs assistance Equipment used: Rolling walker (2 wheeled) Transfers: Sit to/from Stand Sit to Stand: Min assist         General transfer comment: MIN assist for safety and power up to stand from EOB with cues for safe hand placement and  increased use of Rt LE to rise for pain control.  Ambulation/Gait Ambulation/Gait assistance: Min assist Gait Distance (Feet): 45 Feet Assistive device: Rolling walker (2 wheeled) Gait Pattern/deviations: Step-to pattern;Decreased stride length;Decreased weight shift to left Gait velocity: decr   General Gait Details: pt performed pre gait marching with no knee buckling and use of B UEs on RW. MIN assist for safety with cues for step to gait pattern and no LOB.  Stairs            Wheelchair Mobility    Modified Rankin (Stroke Patients Only)       Balance Overall balance assessment: Needs assistance Sitting-balance support: Feet supported Sitting balance-Leahy Scale: Good     Standing balance support: Bilateral upper extremity supported;During functional activity Standing balance-Leahy Scale: Poor Standing balance comment: use of RW to maintain standing balance                             Pertinent Vitals/Pain Pain Assessment: 0-10 Pain Score: 9  Pain Location: Lt knee Pain Descriptors / Indicators: Heaviness;Discomfort;Guarding Pain Intervention(s): Limited activity within patient's tolerance;Monitored during session;Repositioned;Ice applied    Home Living Family/patient expects to be discharged to:: Private residence Living Arrangements: Other relatives Available Help at Discharge: Family Type of Home: House Home Access: Stairs to enter Entrance Stairs-Rails: None Entrance Stairs-Number of Steps: 1 Home Layout: One level Home Equipment: Environmental consultant - 2 wheels;Cane - single point;Bedside commode Additional Comments: pt will be staying at her mom's house and have asssitsance from  her sister.    Prior Function Level of Independence: Independent with assistive device(s)         Comments: SPC     Hand Dominance   Dominant Hand: Right    Extremity/Trunk Assessment   Upper Extremity Assessment Upper Extremity Assessment: Overall WFL for tasks  assessed    Lower Extremity Assessment Lower Extremity Assessment: LLE deficits/detail LLE Deficits / Details: pt with good Lt quad set strength and 4+/5 B dorsi/plantar flexion strength. Pt was able to perform full SLR with no extensor lag. LLE Sensation: WNL LLE Coordination: WNL    Cervical / Trunk Assessment Cervical / Trunk Assessment: Normal  Communication   Communication: No difficulties  Cognition Arousal/Alertness: Awake/alert Behavior During Therapy: WFL for tasks assessed/performed Overall Cognitive Status: Within Functional Limits for tasks assessed                                        General Comments General comments (skin integrity, edema, etc.): pt claustrophobic and very guarded and protective of Lt knee.    Exercises Total Joint Exercises Ankle Circles/Pumps: AROM;Both;20 reps;Seated   Assessment/Plan    PT Assessment Patient needs continued PT services  PT Problem List Decreased strength;Decreased range of motion;Decreased activity tolerance;Decreased balance;Decreased mobility;Decreased knowledge of use of DME;Pain       PT Treatment Interventions DME instruction;Gait training;Stair training;Functional mobility training;Therapeutic activities;Therapeutic exercise;Balance training;Patient/family education    PT Goals (Current goals can be found in the Care Plan section)  Acute Rehab PT Goals Patient Stated Goal: regain independence PT Goal Formulation: With patient/family Time For Goal Achievement: 04/11/20 Potential to Achieve Goals: Good    Frequency 7X/week   Barriers to discharge        Co-evaluation               AM-PAC PT "6 Clicks" Mobility  Outcome Measure Help needed turning from your back to your side while in a flat bed without using bedrails?: None Help needed moving from lying on your back to sitting on the side of a flat bed without using bedrails?: A Little Help needed moving to and from a bed to a chair  (including a wheelchair)?: A Little Help needed standing up from a chair using your arms (e.g., wheelchair or bedside chair)?: A Little Help needed to walk in hospital room?: A Little Help needed climbing 3-5 steps with a railing? : A Lot 6 Click Score: 18    End of Session Equipment Utilized During Treatment: Gait belt Activity Tolerance: Patient tolerated treatment well Patient left: in chair;with call bell/phone within reach;with chair alarm set;with family/visitor present Nurse Communication: Mobility status (pt reporting pain level at 9/10) PT Visit Diagnosis: Unsteadiness on feet (R26.81);Muscle weakness (generalized) (M62.81);Pain Pain - Right/Left: Left Pain - part of body: Knee    Time: 0254-2706 PT Time Calculation (min) (ACUTE ONLY): 31 min   Charges:              Elna Breslow, SPT  Acute rehab    Elna Breslow 04/04/2020, 6:58 PM

## 2020-04-04 NOTE — Anesthesia Procedure Notes (Signed)
Procedure Name: Intubation Date/Time: 04/04/2020 11:38 AM Performed by: Sharlette Dense, CRNA Patient Re-evaluated:Patient Re-evaluated prior to induction Oxygen Delivery Method: Circle system utilized Preoxygenation: Pre-oxygenation with 100% oxygen Induction Type: IV induction Ventilation: Mask ventilation without difficulty and Oral airway inserted - appropriate to patient size Laryngoscope Size: Sabra Heck and 2 Grade View: Grade I Tube type: Oral Tube size: 7.5 mm Number of attempts: 1 Airway Equipment and Method: Stylet Placement Confirmation: ETT inserted through vocal cords under direct vision,  positive ETCO2 and breath sounds checked- equal and bilateral Secured at: 22 cm Tube secured with: Tape Dental Injury: Teeth and Oropharynx as per pre-operative assessment

## 2020-04-05 ENCOUNTER — Encounter (HOSPITAL_COMMUNITY): Payer: Self-pay | Admitting: Orthopedic Surgery

## 2020-04-05 DIAGNOSIS — E785 Hyperlipidemia, unspecified: Secondary | ICD-10-CM | POA: Diagnosis not present

## 2020-04-05 DIAGNOSIS — M1712 Unilateral primary osteoarthritis, left knee: Secondary | ICD-10-CM | POA: Diagnosis not present

## 2020-04-05 DIAGNOSIS — Z7952 Long term (current) use of systemic steroids: Secondary | ICD-10-CM | POA: Diagnosis not present

## 2020-04-05 DIAGNOSIS — Z791 Long term (current) use of non-steroidal anti-inflammatories (NSAID): Secondary | ICD-10-CM | POA: Diagnosis not present

## 2020-04-05 DIAGNOSIS — I10 Essential (primary) hypertension: Secondary | ICD-10-CM | POA: Diagnosis not present

## 2020-04-05 LAB — BASIC METABOLIC PANEL
Anion gap: 9 (ref 5–15)
BUN: 14 mg/dL (ref 8–23)
CO2: 26 mmol/L (ref 22–32)
Calcium: 8 mg/dL — ABNORMAL LOW (ref 8.9–10.3)
Chloride: 104 mmol/L (ref 98–111)
Creatinine, Ser: 0.71 mg/dL (ref 0.44–1.00)
GFR, Estimated: >60 mL/min (ref >60)
Glucose, Bld: 141 mg/dL — ABNORMAL HIGH (ref 70–99)
Potassium: 3.6 mmol/L (ref 3.5–5.1)
Sodium: 139 mmol/L (ref 135–145)

## 2020-04-05 LAB — CBC
HCT: 31.1 % — ABNORMAL LOW (ref 36.0–46.0)
Hemoglobin: 10.1 g/dL — ABNORMAL LOW (ref 12.0–15.0)
MCH: 30.2 pg (ref 26.0–34.0)
MCHC: 32.5 g/dL (ref 30.0–36.0)
MCV: 93.1 fL (ref 80.0–100.0)
Platelets: 244 10*3/uL (ref 150–400)
RBC: 3.34 MIL/uL — ABNORMAL LOW (ref 3.87–5.11)
RDW: 12.2 % (ref 11.5–15.5)
WBC: 11.2 10*3/uL — ABNORMAL HIGH (ref 4.0–10.5)
nRBC: 0 % (ref 0.0–0.2)

## 2020-04-05 MED ORDER — CELECOXIB 200 MG PO CAPS: 200.0000 mg | ORAL_CAPSULE | Freq: Two times a day (BID) | ORAL | 0 refills | Status: DC

## 2020-04-05 MED ORDER — POLYETHYLENE GLYCOL 3350 17 G PO PACK: 17.0000 g | PACK | Freq: Two times a day (BID) | ORAL | 0 refills | Status: DC

## 2020-04-05 MED ORDER — METHOCARBAMOL 500 MG PO TABS: 500.0000 mg | ORAL_TABLET | Freq: Four times a day (QID) | ORAL | 0 refills | Status: DC | PRN

## 2020-04-05 MED ORDER — FERROUS SULFATE 325 (65 FE) MG PO TABS
325.0000 mg | ORAL_TABLET | Freq: Three times a day (TID) | ORAL | 0 refills | Status: DC
Start: 2020-04-05 — End: 2020-08-02

## 2020-04-05 MED ORDER — HYDROCODONE-ACETAMINOPHEN 7.5-325 MG PO TABS: 1.0000 | ORAL_TABLET | ORAL | 0 refills | Status: DC | PRN

## 2020-04-05 MED ORDER — DOCUSATE SODIUM 100 MG PO CAPS: 100.0000 mg | ORAL_CAPSULE | Freq: Two times a day (BID) | ORAL | 0 refills | Status: DC

## 2020-04-05 MED ORDER — ASPIRIN 81 MG PO CHEW: 81.0000 mg | CHEWABLE_TABLET | Freq: Two times a day (BID) | ORAL | 0 refills | Status: AC

## 2020-04-05 NOTE — TOC Transition Note (Signed)
Transition of Care [facility]) - CM/SW Discharge Note   Patient Details  Name: Teresa Murphy MRN: [id number] Date of Birth: Oct 02, 1950  [facility] CM/SW Contact:  Lennart Pall, LCSW Phone Number: 04/05/2020, 10:24 AM   Clinical Narrative:    Met briefly with pt and confirming she already has needed DME.  Plan for OPPT at [facility].  No TOC needs.   Final next level of care: OP Rehab Barriers to Discharge: No Barriers Identified   Patient Goals and CMS Choice Patient states their goals for this hospitalization and ongoing recovery are:: return home and rehab      Discharge Placement                       Discharge Plan and Services                DME Arranged: N/A                    Social Determinants of Health (SDOH) Interventions     Readmission Risk Interventions No flowsheet data found.

## 2020-04-05 NOTE — Progress Notes (Signed)
Physical Therapy Treatment Patient Details Name: Teresa Murphy MRN: [id number] DOB: Nov 30, 1950 Today's Date: 04/05/2020    History of Present Illness Patient is a 70 y.o. female s/p Lt TKA on 04/04/2020 with PMH significant for severe obesity, OA, HTN, HLD, depression, anxiety, pneumonia, asthma, and Rt TKA (2021).    PT Comments    Pt progressing well this am. Reviewed areas below. Pt is ready to d/c with family support from Pt standpoint.   Follow Up Recommendations  Follow surgeon's recommendation for DC plan and follow-up therapies;Home health PT     Equipment Recommendations  None recommended by PT    Recommendations for Other Services       Precautions / Restrictions Precautions Precautions: Fall;Knee Restrictions Weight Bearing Restrictions: No Other Position/Activity Restrictions: WBAT    Mobility  Bed Mobility   Bed Mobility: Supine to Sit;Sit to Supine     Supine to sit: Modified independent (Device/Increase time) Sit to supine: Modified independent (Device/Increase time)        Transfers     Transfers: Sit to/from Stand Sit to Stand: Supervision         General transfer comment: pt self corrects for hand placement  Ambulation/Gait Ambulation/Gait assistance: Min guard;Supervision Gait Distance (Feet): 120 Feet Assistive device: Rolling walker (2 wheeled) Gait Pattern/deviations: Step-to pattern;Step-through pattern;Decreased stance time - left     General Gait Details: cues for progression, steady gait, no LOB   Stairs Stairs: Yes Stairs assistance: Min guard;Min assist Stair Management: No rails;Step to pattern;With walker;Forwards Number of Stairs: 1 General stair comments: cues for sequence, min/guard for safety   Wheelchair Mobility    Modified Rankin (Stroke Patients Only)       Balance                                            Cognition Arousal/Alertness: Awake/alert Behavior During Therapy: WFL for  tasks assessed/performed Overall Cognitive Status: Within Functional Limits for tasks assessed                                        Exercises Total Joint Exercises Ankle Circles/Pumps: AROM;Both;10 reps Quad Sets: AROM;Both;5 reps Heel Slides: AAROM;Right;10 reps    General Comments        Pertinent Vitals/Pain Pain Score: 3  Pain Location: Lt knee Pain Descriptors / Indicators: Heaviness;Discomfort;Guarding Pain Intervention(s): Limited activity within patient's tolerance;Monitored during session;Premedicated before session;Ice applied    Home Living                      Prior Function            PT Goals (current goals can now be found in the care plan section) Acute Rehab PT Goals Patient Stated Goal: regain independence PT Goal Formulation: With patient/family Time For Goal Achievement: 04/11/20 Potential to Achieve Goals: Good Progress towards PT goals: Progressing toward goals    Frequency    7X/week      PT Plan Current plan remains appropriate    Co-evaluation              AM-PAC PT "6 Clicks" Mobility   Outcome Measure  Help needed turning from your back to your side while in a flat bed without using bedrails?: None Help needed  moving from lying on your back to sitting on the side of a flat bed without using bedrails?: None Help needed moving to and from a bed to a chair (including a wheelchair)?: A Little Help needed standing up from a chair using your arms (e.g., wheelchair or bedside chair)?: A Little Help needed to walk in hospital room?: A Little Help needed climbing 3-5 steps with a railing? : A Little 6 Click Score: 20    End of Session Equipment Utilized During Treatment: Gait belt Activity Tolerance: Patient tolerated treatment well Patient left: with call bell/phone within reach;in bed;with bed alarm set   PT Visit Diagnosis: Unsteadiness on feet (R26.81);Muscle weakness (generalized)  (M62.81);Pain Pain - Right/Left: Left Pain - part of body: Knee     Time: 3013-1438 PT Time Calculation (min) (ACUTE ONLY): 23 min  Charges:  $Gait Training: 23-37 mins                     Baxter Flattery, PT  Acute Rehab Dept ([place]) [phone] Pager [phone]  04/05/2020    [facility] 04/05/2020, 10:53 AM

## 2020-04-05 NOTE — Discharge Summary (Signed)
Patient ID: Teresa Murphy MRN: [id number] DOB/AGE: [birth year] 72 y.o.  Admit date: 04/04/2020 Discharge date: 04/05/2020  Admission Diagnoses:  Principal Problem:   Osteoarthritis of left knee Active Problems:   Obese   Status post total left knee replacement   Discharge Diagnoses:  Same  Past Medical History:  Diagnosis Date  . Allergic rhinitis   . Anxiety   . Anxiety disorder 07/10/2015  . Arthritis   . Asthma   . Asthma with bronchitis 04/19/2010   PFT 09/18/2010-FEV1  1.95/92; FEV1/FVC 0.76; FEF 25-75% improved 48% with bronchodilator; normal lung volumes; DLCO 78%    . CAP (community acquired pneumonia) 03/20/2016  . Depression   . Essential hypertension 04/21/2010   Qualifier: Diagnosis of  By: Annamaria Boots MD, Clinton D   . History of kidney stones   . Hyperlipidemia 11/25/2017  . Hypertension   . Osteoarthritis of both knees 07/10/2015  . Pneumonia    hx of   . Seasonal and perennial allergic rhinitis 04/19/2010   Allergy Profile 04/19/2010-negative, IgE  3.8 Allergy skin test 11/07/2010-positive for grass, weed, tree pollens, dust mite   . Severe obesity (BMI 35.0-39.9) with comorbidity ([place]) 01/11/2016  . Vitamin D deficiency 07/10/2015    Surgeries: Procedure(s): LEFT TOTAL KNEE ARTHROPLASTY on 04/04/2020   Consultants: N/A  Discharged Condition: Improved  Hospital Course: RITU GAGLIARDO is an 70 y.o. female who was admitted 04/04/2020 for operative treatment ofOsteoarthritis of left knee. Patient has severe unremitting pain that affects sleep, daily activities, and work/hobbies. After pre-op clearance the patient was taken to the operating room on 04/04/2020 and underwent  Procedure(s): LEFT TOTAL KNEE ARTHROPLASTY.    Patient was given perioperative antibiotics:  Anti-infectives (From admission, onward)   Start     Dose/Rate Route Frequency Ordered Stop   04/04/20 1730  ceFAZolin (ANCEF) IVPB 2g/100 mL premix        2 g 200 mL/hr over 30 Minutes Intravenous Every 6 hours  04/04/20 1533 04/04/20 2321   04/04/20 1045  ceFAZolin (ANCEF) IVPB 2g/100 mL premix        2 g 200 mL/hr over 30 Minutes Intravenous On call to O.R. 04/04/20 1041 04/04/20 1149       Patient was given sequential compression devices, early ambulation, and chemoprophylaxis to prevent DVT.  Patient benefited maximally from hospital stay and there were no complications.    Recent vital signs:  Patient Vitals for the past 24 hrs:  BP Temp Temp src Pulse Resp SpO2 Height Weight  04/05/20 0552 (!) 101/47 98.1 F (36.7 C) -- 73 16 93 % -- --  04/05/20 0140 (!) 104/57 97.7 F (36.5 C) -- 79 16 100 % -- --  04/04/20 2124 125/65 97.8 F (36.6 C) Oral 70 16 99 % -- --  04/04/20 1827 136/66 98.5 F (36.9 C) Oral 79 16 94 % -- --  04/04/20 1739 140/74 98.5 F (36.9 C) Oral 79 15 92 % -- --  04/04/20 1630 (!) 141/76 98.2 F (36.8 C) Oral 78 18 100 % -- --  04/04/20 1628 -- -- -- 79 18 100 % -- --  04/04/20 1539 (!) 152/79 98.2 F (36.8 C) Oral 79 18 100 % 5\' 2"  (1.575 m) 93.9 kg  04/04/20 1515 140/66 -- -- 72 (!) 8 100 % -- --  04/04/20 1500 135/78 -- -- 74 10 100 % -- --  04/04/20 1445 140/78 -- -- 74 10 99 % -- --  04/04/20 1430 (!) 144/74 -- -- 76  12 99 % -- --  04/04/20 1415 (!) 143/68 -- -- 91 17 100 % -- --  04/04/20 1400 (!) 148/78 -- -- 80 14 100 % -- --  04/04/20 1345 (!) 146/77 -- -- 81 14 97 % -- --  04/04/20 1336 -- 97.6 F (36.4 C) -- -- -- 98 % -- --  04/04/20 1114 131/70 98.7 F (37.1 C) Oral 83 16 99 % -- --  04/04/20 1102 -- -- -- -- -- -- 5\' 2"  (1.575 m) 93.9 kg     Recent laboratory studies:  Recent Labs    04/05/20 0256  WBC 11.2*  HGB 10.1*  HCT 31.1*  PLT 244  NA 139  K 3.6  CL 104  CO2 26  BUN 14  CREATININE 0.71  GLUCOSE 141*  CALCIUM 8.0*     Discharge Medications:   Allergies as of 04/05/2020   No Known Allergies     Medication List    STOP taking these medications   acetaminophen 500 MG tablet Commonly known as: TYLENOL     TAKE  these medications   albuterol 108 (90 Base) MCG/ACT inhaler Commonly known as: ProAir HFA Inhale 2 puffs into the lungs every 4 (four) hours as needed. What changed: reasons to take this   aspirin 81 MG chewable tablet Commonly known as: Aspirin Childrens Chew 1 tablet (81 mg total) by mouth 2 (two) times daily. Take for 4 weeks, then resume regular dose. Start taking on: April 06, 2020   celecoxib 200 MG capsule Commonly known as: CeleBREX Take 1 capsule (200 mg total) by mouth 2 (two) times daily.   cetirizine 10 MG tablet Commonly known as: ZYRTEC Take 10 mg by mouth at bedtime.   Cinnamon 500 MG capsule Take 500 mg by mouth daily.   diazepam 5 MG tablet Commonly known as: VALIUM Take 1 tablet (5 mg total) by mouth daily as needed for anxiety.   docusate sodium 100 MG capsule Commonly known as: Colace Take 1 capsule (100 mg total) by mouth 2 (two) times daily.   DULoxetine 60 MG capsule Commonly known as: CYMBALTA Take 1 capsule (60 mg total) by mouth daily. What changed: when to take this   ferrous sulfate 325 (65 FE) MG tablet Commonly known as: FerrouSul Take 1 tablet (325 mg total) by mouth 3 (three) times daily with meals for 14 days.   Fluticasone-Salmeterol 250-50 MCG/DOSE Aepb Commonly known as: Advair Diskus INHALE 1 PUFF INTO LUNGS TWICE A DAY RINSE AFTER USE What changed:   how much to take  how to take this  when to take this  reasons to take this  additional instructions   HYDROcodone-acetaminophen 7.5-325 MG tablet Commonly known as: Norco Take 1-2 tablets by mouth every 4 (four) hours as needed for moderate pain.   latanoprost 0.005 % ophthalmic solution Commonly known as: XALATAN Place 1 drop into both eyes at bedtime.   methocarbamol 500 MG tablet Commonly known as: Robaxin Take 1 tablet (500 mg total) by mouth every 6 (six) hours as needed for muscle spasms.   montelukast 10 MG tablet Commonly known as: SINGULAIR TAKE 1 TABLET BY  MOUTH EVERYDAY AT BEDTIME What changed: See the new instructions.   MOVE FREE JOINT HEALTH ADVANCE PO Take 1 tablet by mouth daily.   Multivitamin Women 50+ Tabs Take 1 tablet by mouth daily.   olmesartan 20 MG tablet Commonly known as: BENICAR Take 1 tablet (20 mg total) by mouth daily.   polyethylene  glycol 17 g packet Commonly known as: MIRALAX / GLYCOLAX Take 17 g by mouth 2 (two) times daily.   vitamin B-12 500 MCG tablet Commonly known as: CYANOCOBALAMIN Take 500 mcg by mouth every other day.   Vitamin D (Ergocalciferol) 1.25 MG (50000 UNIT) Caps capsule Commonly known as: DRISDOL TAKE ONE CAPSULE BY MOUTH EVERY 5 DAYS What changed: See the new instructions.            Discharge Care Instructions  (From admission, onward)         Start     Ordered   04/05/20 0000  Change dressing       Comments: Maintain surgical dressing until follow up in the clinic. If the edges start to pull up, may reinforce with tape. If the dressing is no longer working, may remove and cover with gauze and tape, but must keep the area dry and clean.  Call with any questions or concerns.   04/05/20 4431          Diagnostic Studies: No results found.  Disposition: Home  Discharge Instructions    Call MD / Call 911   Complete by: As directed    If you experience chest pain or shortness of breath, CALL 911 and be transported to the hospital emergency room.  If you develope a fever above 101 F, pus (white drainage) or increased drainage or redness at the wound, or calf pain, call your surgeon's office.   Change dressing   Complete by: As directed    Maintain surgical dressing until follow up in the clinic. If the edges start to pull up, may reinforce with tape. If the dressing is no longer working, may remove and cover with gauze and tape, but must keep the area dry and clean.  Call with any questions or concerns.   Constipation Prevention   Complete by: As directed    Drink plenty of  fluids.  Prune juice may be helpful.  You may use a stool softener, such as Colace (over the counter) 100 mg twice a day.  Use MiraLax (over the counter) for constipation as needed.   Diet - low sodium heart healthy   Complete by: As directed    Discharge instructions   Complete by: As directed    Maintain surgical dressing until follow up in the clinic. If the edges start to pull up, may reinforce with tape. If the dressing is no longer working, may remove and cover with gauze and tape, but must keep the area dry and clean.  Follow up in 2 weeks at [facility]. Call with any questions or concerns.   Increase activity slowly as tolerated   Complete by: As directed    Weight bearing as tolerated with assist device (walker, cane, etc) as directed, use it as long as suggested by your surgeon or therapist, typically at least 4-6 weeks.   TED hose   Complete by: As directed    Use stockings (TED hose) for 2 weeks on both leg(s).  You may remove them at night for sleeping.       Follow-up Information    Paralee Cancel, MD. Go on 04/26/2020.   Specialty: Orthopedic Surgery Why: You are scheduled for first post op appointment on Wednesday March 23rd at 10:00am. Contact information: [address] [place] [address] [phone]        Rosilyn Mings.. Go on 04/07/2020.   Why: You are scheduled for physical therapy eval on Friday March 4th  at 10:15am. Contact information: [address] [place] [address] [phone]                Signed: Lucille Passy [facility] 04/05/2020, 8:32 AM

## 2020-04-05 NOTE — Progress Notes (Signed)
Pt and pts sister both verbalize understanding of all discharge instructions. All questions answered. To car via w/c with all pt belongings and discharge instructions.

## 2020-04-05 NOTE — Progress Notes (Signed)
    Subjective: 1 Day Post-Op Procedure(s) (LRB): TOTAL KNEE ARTHROPLASTY (Left)    Seen by Dr. Alvan Dame. Patient reports pain as mild, pain controlled.  No reported events throughout the night.  Dr. Alvan Dame discussed the procedure, findings and expectations moving forward.  Ready to be discharged home, if they do well with PT.  Follow up in the clinic in 2 weeks.  Knows to call with any questions or concerns.     Patient's anticipated LOS is less than 2 midnights, meeting these requirements: - Lives within 1 hour of care - Has a competent adult at home to recover with post-op recover - NO history of  - Chronic pain requiring opiods  - Diabetes  - Coronary Artery Disease  - Heart failure  - Heart attack  - Stroke  - DVT/VTE  - Cardiac arrhythmia  - Respiratory Failure/COPD  - Renal failure  - Anemia  - Advanced Liver disease       Objective:   VITALS:   Vitals:   04/05/20 0140 04/05/20 0552  BP: (!) 104/57 (!) 101/47  Pulse: 79 73  Resp: 16 16  Temp: 97.7 F (36.5 C) 98.1 F (36.7 C)  SpO2: 100% 93%    Dorsiflexion/Plantar flexion intact Incision: dressing C/D/I No cellulitis present Compartment soft  LABS Recent Labs    04/05/20 0256  HGB 10.1*  HCT 31.1*  WBC 11.2*  PLT 244    Recent Labs    04/05/20 0256  NA 139  K 3.6  BUN 14  CREATININE 0.71  GLUCOSE 141*     Assessment/Plan: 1 Day Post-Op Procedure(s) (LRB): TOTAL KNEE ARTHROPLASTY (Left)  Advance diet Up with therapy D/C IV fluids Discharge home  Follow up in 2 [facility] at [facility] Follow up with OLIN,Natonya Finstad D in 2 weeks.  Contact information:  [facility] [address], [address] [place] [phone]      Obese (BMI 30-39.9) Estimated body mass index is 37.86 kg/m as calculated from the following:   Height as of this encounter: 5\' 2"  (1.575 m).   Weight as of this encounter: 93.9 kg. Patient also counseled that weight may inhibit the healing  process Patient counseled that losing weight will help with future health issues        Danae Orleans PA-C  [facility]  [place] [address], [address], [place], [place] [address] Phone: [phone] [email or web address]  [employer]

## 2020-04-07 DIAGNOSIS — M25562 Pain in left knee: Secondary | ICD-10-CM | POA: Diagnosis not present

## 2020-04-07 NOTE — Anesthesia Postprocedure Evaluation (Signed)
Anesthesia Post Note  Patient: Teresa Murphy  Procedure(Murphy) Performed: TOTAL KNEE ARTHROPLASTY (Left Knee)     Patient location during evaluation: PACU Anesthesia Type: General Level of consciousness: awake and alert Pain management: pain level controlled Vital Signs Assessment: post-procedure vital signs reviewed and stable Respiratory status: spontaneous breathing, nonlabored ventilation, respiratory function stable and patient connected to nasal cannula oxygen Cardiovascular status: blood pressure returned to baseline and stable Postop Assessment: no apparent nausea or vomiting Anesthetic complications: no   No complications documented.  Last Vitals:  Vitals:   04/05/20 1006 04/05/20 1332  BP: 110/60 (!) 107/51  Pulse: 73 82  Resp: 16 16  Temp: 36.7 C 36.9 C  SpO2: 96% 97%    Last Pain:  Vitals:   04/05/20 1247  TempSrc:   PainSc: 0-No pain                 Teresa Murphy

## 2020-04-10 DIAGNOSIS — M25562 Pain in left knee: Secondary | ICD-10-CM | POA: Diagnosis not present

## 2020-04-10 DIAGNOSIS — Z96652 Presence of left artificial knee joint: Secondary | ICD-10-CM | POA: Diagnosis not present

## 2020-04-10 DIAGNOSIS — R269 Unspecified abnormalities of gait and mobility: Secondary | ICD-10-CM | POA: Diagnosis not present

## 2020-04-12 DIAGNOSIS — R269 Unspecified abnormalities of gait and mobility: Secondary | ICD-10-CM | POA: Diagnosis not present

## 2020-04-12 DIAGNOSIS — M25562 Pain in left knee: Secondary | ICD-10-CM | POA: Diagnosis not present

## 2020-04-12 DIAGNOSIS — Z96652 Presence of left artificial knee joint: Secondary | ICD-10-CM | POA: Diagnosis not present

## 2020-04-13 ENCOUNTER — Other Ambulatory Visit: Payer: Self-pay | Admitting: Family Medicine

## 2020-04-13 DIAGNOSIS — F411 Generalized anxiety disorder: Secondary | ICD-10-CM

## 2020-04-14 DIAGNOSIS — R269 Unspecified abnormalities of gait and mobility: Secondary | ICD-10-CM | POA: Diagnosis not present

## 2020-04-14 DIAGNOSIS — Z96652 Presence of left artificial knee joint: Secondary | ICD-10-CM | POA: Diagnosis not present

## 2020-04-14 DIAGNOSIS — M25562 Pain in left knee: Secondary | ICD-10-CM | POA: Diagnosis not present

## 2020-04-14 NOTE — Telephone Encounter (Signed)
Last filled 02/02/20, last OV 12/11/19

## 2020-04-17 DIAGNOSIS — R269 Unspecified abnormalities of gait and mobility: Secondary | ICD-10-CM | POA: Diagnosis not present

## 2020-04-17 DIAGNOSIS — M25562 Pain in left knee: Secondary | ICD-10-CM | POA: Diagnosis not present

## 2020-04-17 DIAGNOSIS — Z96652 Presence of left artificial knee joint: Secondary | ICD-10-CM | POA: Diagnosis not present

## 2020-04-19 DIAGNOSIS — R269 Unspecified abnormalities of gait and mobility: Secondary | ICD-10-CM | POA: Diagnosis not present

## 2020-04-19 DIAGNOSIS — M25562 Pain in left knee: Secondary | ICD-10-CM | POA: Diagnosis not present

## 2020-04-19 DIAGNOSIS — Z96652 Presence of left artificial knee joint: Secondary | ICD-10-CM | POA: Diagnosis not present

## 2020-04-21 DIAGNOSIS — R269 Unspecified abnormalities of gait and mobility: Secondary | ICD-10-CM | POA: Diagnosis not present

## 2020-04-21 DIAGNOSIS — M25562 Pain in left knee: Secondary | ICD-10-CM | POA: Diagnosis not present

## 2020-04-21 DIAGNOSIS — Z96652 Presence of left artificial knee joint: Secondary | ICD-10-CM | POA: Diagnosis not present

## 2020-04-24 DIAGNOSIS — R269 Unspecified abnormalities of gait and mobility: Secondary | ICD-10-CM | POA: Diagnosis not present

## 2020-04-24 DIAGNOSIS — Z96652 Presence of left artificial knee joint: Secondary | ICD-10-CM | POA: Diagnosis not present

## 2020-04-24 DIAGNOSIS — M25562 Pain in left knee: Secondary | ICD-10-CM | POA: Diagnosis not present

## 2020-04-28 DIAGNOSIS — R269 Unspecified abnormalities of gait and mobility: Secondary | ICD-10-CM | POA: Diagnosis not present

## 2020-04-28 DIAGNOSIS — Z96652 Presence of left artificial knee joint: Secondary | ICD-10-CM | POA: Diagnosis not present

## 2020-04-28 DIAGNOSIS — M25562 Pain in left knee: Secondary | ICD-10-CM | POA: Diagnosis not present

## 2020-05-01 DIAGNOSIS — Z96652 Presence of left artificial knee joint: Secondary | ICD-10-CM | POA: Diagnosis not present

## 2020-05-01 DIAGNOSIS — M25562 Pain in left knee: Secondary | ICD-10-CM | POA: Diagnosis not present

## 2020-05-01 DIAGNOSIS — R269 Unspecified abnormalities of gait and mobility: Secondary | ICD-10-CM | POA: Diagnosis not present

## 2020-05-03 DIAGNOSIS — R269 Unspecified abnormalities of gait and mobility: Secondary | ICD-10-CM | POA: Diagnosis not present

## 2020-05-03 DIAGNOSIS — Z96652 Presence of left artificial knee joint: Secondary | ICD-10-CM | POA: Diagnosis not present

## 2020-05-03 DIAGNOSIS — M25562 Pain in left knee: Secondary | ICD-10-CM | POA: Diagnosis not present

## 2020-05-05 DIAGNOSIS — R269 Unspecified abnormalities of gait and mobility: Secondary | ICD-10-CM | POA: Diagnosis not present

## 2020-05-05 DIAGNOSIS — Z96652 Presence of left artificial knee joint: Secondary | ICD-10-CM | POA: Diagnosis not present

## 2020-05-05 DIAGNOSIS — M25562 Pain in left knee: Secondary | ICD-10-CM | POA: Diagnosis not present

## 2020-05-08 DIAGNOSIS — Z96652 Presence of left artificial knee joint: Secondary | ICD-10-CM | POA: Diagnosis not present

## 2020-05-08 DIAGNOSIS — M25562 Pain in left knee: Secondary | ICD-10-CM | POA: Diagnosis not present

## 2020-05-08 DIAGNOSIS — R269 Unspecified abnormalities of gait and mobility: Secondary | ICD-10-CM | POA: Diagnosis not present

## 2020-05-09 ENCOUNTER — Other Ambulatory Visit: Payer: Self-pay | Admitting: Family Medicine

## 2020-05-09 DIAGNOSIS — E559 Vitamin D deficiency, unspecified: Secondary | ICD-10-CM

## 2020-05-10 DIAGNOSIS — R269 Unspecified abnormalities of gait and mobility: Secondary | ICD-10-CM | POA: Diagnosis not present

## 2020-05-10 DIAGNOSIS — M25562 Pain in left knee: Secondary | ICD-10-CM | POA: Diagnosis not present

## 2020-05-10 DIAGNOSIS — Z96652 Presence of left artificial knee joint: Secondary | ICD-10-CM | POA: Diagnosis not present

## 2020-05-12 DIAGNOSIS — R269 Unspecified abnormalities of gait and mobility: Secondary | ICD-10-CM | POA: Diagnosis not present

## 2020-05-12 DIAGNOSIS — Z96652 Presence of left artificial knee joint: Secondary | ICD-10-CM | POA: Diagnosis not present

## 2020-05-12 DIAGNOSIS — M25562 Pain in left knee: Secondary | ICD-10-CM | POA: Diagnosis not present

## 2020-05-15 DIAGNOSIS — R269 Unspecified abnormalities of gait and mobility: Secondary | ICD-10-CM | POA: Diagnosis not present

## 2020-05-15 DIAGNOSIS — M25562 Pain in left knee: Secondary | ICD-10-CM | POA: Diagnosis not present

## 2020-05-15 DIAGNOSIS — Z96652 Presence of left artificial knee joint: Secondary | ICD-10-CM | POA: Diagnosis not present

## 2020-05-17 DIAGNOSIS — M7062 Trochanteric bursitis, left hip: Secondary | ICD-10-CM | POA: Diagnosis not present

## 2020-05-17 DIAGNOSIS — Z471 Aftercare following joint replacement surgery: Secondary | ICD-10-CM | POA: Diagnosis not present

## 2020-05-17 DIAGNOSIS — Z4789 Encounter for other orthopedic aftercare: Secondary | ICD-10-CM | POA: Diagnosis not present

## 2020-05-19 DIAGNOSIS — M25562 Pain in left knee: Secondary | ICD-10-CM | POA: Diagnosis not present

## 2020-05-19 DIAGNOSIS — Z96652 Presence of left artificial knee joint: Secondary | ICD-10-CM | POA: Diagnosis not present

## 2020-05-19 DIAGNOSIS — R269 Unspecified abnormalities of gait and mobility: Secondary | ICD-10-CM | POA: Diagnosis not present

## 2020-05-22 DIAGNOSIS — R269 Unspecified abnormalities of gait and mobility: Secondary | ICD-10-CM | POA: Diagnosis not present

## 2020-05-22 DIAGNOSIS — M25562 Pain in left knee: Secondary | ICD-10-CM | POA: Diagnosis not present

## 2020-05-22 DIAGNOSIS — Z96652 Presence of left artificial knee joint: Secondary | ICD-10-CM | POA: Diagnosis not present

## 2020-05-23 ENCOUNTER — Other Ambulatory Visit: Payer: Medicare Other

## 2020-05-24 DIAGNOSIS — M25562 Pain in left knee: Secondary | ICD-10-CM | POA: Diagnosis not present

## 2020-05-24 DIAGNOSIS — Z96652 Presence of left artificial knee joint: Secondary | ICD-10-CM | POA: Diagnosis not present

## 2020-05-24 DIAGNOSIS — R269 Unspecified abnormalities of gait and mobility: Secondary | ICD-10-CM | POA: Diagnosis not present

## 2020-05-26 DIAGNOSIS — Z96652 Presence of left artificial knee joint: Secondary | ICD-10-CM | POA: Diagnosis not present

## 2020-05-26 DIAGNOSIS — M25562 Pain in left knee: Secondary | ICD-10-CM | POA: Diagnosis not present

## 2020-05-26 DIAGNOSIS — R269 Unspecified abnormalities of gait and mobility: Secondary | ICD-10-CM | POA: Diagnosis not present

## 2020-05-29 DIAGNOSIS — R269 Unspecified abnormalities of gait and mobility: Secondary | ICD-10-CM | POA: Diagnosis not present

## 2020-05-29 DIAGNOSIS — Z96652 Presence of left artificial knee joint: Secondary | ICD-10-CM | POA: Diagnosis not present

## 2020-05-29 DIAGNOSIS — M25562 Pain in left knee: Secondary | ICD-10-CM | POA: Diagnosis not present

## 2020-06-01 DIAGNOSIS — Z96652 Presence of left artificial knee joint: Secondary | ICD-10-CM | POA: Diagnosis not present

## 2020-06-01 DIAGNOSIS — M25562 Pain in left knee: Secondary | ICD-10-CM | POA: Diagnosis not present

## 2020-06-01 DIAGNOSIS — R269 Unspecified abnormalities of gait and mobility: Secondary | ICD-10-CM | POA: Diagnosis not present

## 2020-06-05 ENCOUNTER — Other Ambulatory Visit: Payer: Self-pay | Admitting: Internal Medicine

## 2020-06-05 ENCOUNTER — Other Ambulatory Visit: Payer: Self-pay | Admitting: Family Medicine

## 2020-06-05 DIAGNOSIS — Z96652 Presence of left artificial knee joint: Secondary | ICD-10-CM | POA: Diagnosis not present

## 2020-06-05 DIAGNOSIS — R269 Unspecified abnormalities of gait and mobility: Secondary | ICD-10-CM | POA: Diagnosis not present

## 2020-06-05 DIAGNOSIS — M25562 Pain in left knee: Secondary | ICD-10-CM | POA: Diagnosis not present

## 2020-06-05 DIAGNOSIS — E559 Vitamin D deficiency, unspecified: Secondary | ICD-10-CM

## 2020-06-19 ENCOUNTER — Telehealth: Payer: Self-pay | Admitting: Family Medicine

## 2020-06-19 NOTE — Progress Notes (Signed)
  Chronic Care Management   Note  06/19/2020 Name: Teresa Murphy MRN: [id number] DOB: [birth year]  Teresa Murphy is a 70 y.o. year old female who is a primary care patient of Martinique, Malka So, MD. I reached out to Lennox Grumbles by phone today in response to a referral sent by Ms. Anastasia Pall Malan's PCP, Martinique, [name], MD.   Teresa Murphy was given information about Chronic Care Management services today including:  1. CCM service includes personalized support from designated clinical staff supervised by her physician, including individualized plan of care and coordination with other care providers 2. 24/7 contact phone numbers for assistance for urgent and routine care needs. 3. Service will only be billed when office clinical staff spend 20 minutes or more in a month to coordinate care. 4. Only one practitioner may furnish and bill the service in a calendar month. 5. The patient may stop CCM services at any time (effective at the end of the month) by phone call to the office staff.   Patient agreed to services and verbal consent obtained.   Follow up plan:   Carley Perdue [employer] Scheduler

## 2020-06-29 ENCOUNTER — Other Ambulatory Visit: Payer: Self-pay | Admitting: Family Medicine

## 2020-06-29 DIAGNOSIS — F411 Generalized anxiety disorder: Secondary | ICD-10-CM

## 2020-06-30 NOTE — Telephone Encounter (Signed)
Last filled 04/17/20

## 2020-07-31 ENCOUNTER — Telehealth: Payer: Self-pay | Admitting: Pharmacist

## 2020-07-31 NOTE — Chronic Care Management (AMB) (Signed)
Chronic Care Management Pharmacy Assistant   Name: Teresa Murphy  MRN: [id number] DOB: 1950-08-25  Teresa Murphy is an 70 y.o. year old female who presents for her initial CCM visit with the clinical pharmacist.  Reason for Encounter: Chart Prep for initial visit on 08/02/20 with Jeni Salles the clinical pharmacist.   Conditions to be addressed/monitored: HTN, HLD, Anxiety, Depression, and Asthma and Osteoarthritis.    Patient has Annual physical with Dr. Martinique on 12/25/20 at 10:30am.  Recent office visits:  12/21/19 Betty Martinique MD (PCP) - seen for medicare annual wellness exam and other chronic conditions. No medication changes. Follow up in 6 months.   Recent consult visits:  03/10/20 Candis Shine (Physical Therapy) -  patient presented for physical therapy due to artificial knee joint of right knee with pain. No medication changes or follow up noted.   01/17/20 Shirlee More MD (Cardiology) -  presented to clinic for abnormal EKG. No medication changes. Follow up as needed.   Hospital visits:  Medication Reconciliation was completed by comparing discharge summary, patient's EMR and Pharmacy list, and upon discussion with patient.  Admitted to the hospital on 04/04/20 due to Osteoarthritis of left knee. Discharge date was 04/05/20. Discharged from [facility].  Total left knee replacement surgery.   New?Medications Started at [facility] Discharge:?? -started Aspirin   Medication Changes at Hospital Discharge: -Changed None.   Medications Discontinued at Hospital Discharge: -Stopped Acetaminophen.   Medications that remain the same after Hospital Discharge:??  -All other medications will remain the same.     Medication Reconciliation was completed by comparing discharge summary, patient's EMR and Pharmacy list, and upon discussion with patient.  Admitted to the hospital on 01/18/20 due to Osteoarthritis of right knee. Discharge date was 01/19/20  Discharged from [facility].  Total right knee replacement surgery.   New?Medications Started at [facility] Discharge:?? -started Aspirin, celecoxib, docusate sodium, ferrous sulfate, hydrocodone- acetaminophen, methocarbamol and polyethylene glycol.  Medication Changes at Hospital Discharge: -Changed None.   Medications Discontinued at Hospital Discharge: -Stopped Naproxen.   Medications that remain the same after Hospital Discharge:??  -All other medications will remain the same.    Fill History:  CELECOXIB 200MG  CAP 05/25/2020 30   DULoxetine 60MG      CAP 06/21/2020 90   DOCUSATE SODIUM 100 MG SOFTGEL 04/05/2020 14   VITAMIN D2 1.25MG (50,000 UNIT) 06/05/2020 84   FERROUS SULFATE 325 MG TABLET 04/05/2020 14   GABAPENTIN 300MG     CAP 04/26/2020 30   HYDROCOD/ACETAM 5-325MG  TAB 04/26/2020 8   LATANOPROST 0.005% EYE DROPS 06/19/2019 90   METHOCARBAM 500MG    TAB 06/06/2020 10   MONTELUKAST SOD 10 MG TABLET 06/05/2020 90   OLMESARTAN 20MG  TAB 05/28/2020 90   DIAZEPAM 5 MG TABLET 06/30/2020 30   PREDNISONE 5MG  PAK 05/17/2020 6    Medications: Outpatient Encounter Medications as of 07/31/2020  Medication Sig   albuterol (PROAIR HFA) 108 (90 Base) MCG/ACT inhaler Inhale 2 puffs into the lungs every 4 (four) hours as needed. (Patient taking differently: Inhale 2 puffs into the lungs every 4 (four) hours as needed for wheezing or shortness of breath.)   celecoxib (CELEBREX) 200 MG capsule Take 1 capsule (200 mg total) by mouth 2 (two) times daily.   cetirizine (ZYRTEC) 10 MG tablet Take 10 mg by mouth at bedtime.    Cinnamon 500 MG capsule Take 500 mg by mouth daily.   diazepam (VALIUM) 5 MG tablet TAKE 1 TABLET  BY MOUTH EVERY DAY AS NEEDED FOR ANXIETY   docusate sodium (COLACE) 100 MG capsule Take 1 capsule (100 mg total) by mouth 2 (two) times daily.   DULoxetine (CYMBALTA) 60 MG capsule Take 1 capsule (60 mg total) by mouth daily. (Patient taking  differently: Take 60 mg by mouth every evening.)   ferrous sulfate (FERROUSUL) 325 (65 FE) MG tablet Take 1 tablet (325 mg total) by mouth 3 (three) times daily with meals for 14 days.   Fluticasone-Salmeterol (ADVAIR DISKUS) 250-50 MCG/DOSE AEPB INHALE 1 PUFF INTO LUNGS TWICE A DAY RINSE AFTER USE (Patient taking differently: Inhale 1 puff into the lungs daily as needed (Wheezing).)   Glucos-Chond-Hyal Ac-Ca Fructo (MOVE FREE JOINT HEALTH ADVANCE PO) Take 1 tablet by mouth daily.   HYDROcodone-acetaminophen (NORCO) 7.5-325 MG tablet Take 1-2 tablets by mouth every 4 (four) hours as needed for moderate pain.   latanoprost (XALATAN) 0.005 % ophthalmic solution Place 1 drop into both eyes at bedtime.   methocarbamol (ROBAXIN) 500 MG tablet Take 1 tablet (500 mg total) by mouth every 6 (six) hours as needed for muscle spasms.   montelukast (SINGULAIR) 10 MG tablet Take 1 tablet (10 mg total) by mouth at bedtime. TAKE 1 TABLET BY MOUTH EVERYDAY AT BEDTIME   Multiple Vitamins-Minerals (MULTIVITAMIN WOMEN 50+) TABS Take 1 tablet by mouth daily.   olmesartan (BENICAR) 20 MG tablet Take 1 tablet (20 mg total) by mouth daily.   polyethylene glycol (MIRALAX / GLYCOLAX) 17 g packet Take 17 g by mouth 2 (two) times daily.   vitamin B-12 (CYANOCOBALAMIN) 500 MCG tablet Take 500 mcg by mouth every other day.   Vitamin D, Ergocalciferol, (DRISDOL) 1.25 MG (50000 UNIT) CAPS capsule TAKE ONE CAPSULE BY MOUTH EVERY 5 DAYS   No facility-administered encounter medications on file as of 07/31/2020.    Have you seen any other providers since your last visit?  Yes. Patient has seen a cardiologist before her knee replacement surgeries. Patient has an upcoming appointment with her Pulmonologist Dr. Annamaria Boots.  Any changes in your medications or health?  No. Patient has just had both knees replaced and she is still taking all the same medications.  Any side effects from any medications?  No issues at this time.  Do you have  an symptoms or problems not managed by your medications?  No. Patient feels her medications are all helping her issues currently. Any concerns about your health right now?  No concerns with her health but patients husband did pass away in May on the 13th. She has been a little depressed since but nothing interfering with her everyday life. Patient states she's had a lot of paperwork for his death to do. Has your provider asked that you check blood pressure, blood sugar, or follow special diet at home?  No. Patient stated at one time she was keeping a check on her salt intake due to stress causing her blood pressure to go up.  Do you get any type of exercise on a regular basis?  [place]. She canceled the rest when she came back to visit her husband and he was dying. Patient walks regularly in the house and in the yard. She is also walks out to the barn. She does her knee exercises she learned in physical therapy.  Can you think of a goal you would like to reach for your health?  Patient would like  to lose more weight.  Do you have any problems getting your medications?  No issues at this time.  Is there anything that you would like to discuss during the appointment?  Patient just wants to make sure she's taking her medications at the correct time everyday. Patient is pretty sure she is but just wants to run through them with her that is a pharmacist. She stated that sometimes she thinks her medications for blood pressure were making her sleepy during the day.   Please bring medications and supplements to appointment.  Star Rating Drugs:  Olmesartan 20mg  - last filled on 05/28/20 90DS at [facility].   [place]  Clinical Pharmacist Assistant  [phone]

## 2020-08-01 NOTE — Telephone Encounter (Cosign Needed)
2nd attempt

## 2020-08-02 ENCOUNTER — Ambulatory Visit (INDEPENDENT_AMBULATORY_CARE_PROVIDER_SITE_OTHER): Payer: Medicare Other | Admitting: Pharmacist

## 2020-08-02 DIAGNOSIS — E785 Hyperlipidemia, unspecified: Secondary | ICD-10-CM

## 2020-08-02 DIAGNOSIS — I10 Essential (primary) hypertension: Secondary | ICD-10-CM

## 2020-08-02 NOTE — Progress Notes (Signed)
Chronic Care Management Pharmacy Note  08/02/2020 Name:  Teresa Murphy MRN:  [id number] DOB:  1950/05/10  Summary: LDL not at goal < 100 BP readings have been elevated at home  Recommendations/Changes made from today's visit: -Recommend checking A1c (previous was 2019) as patient has had BGs in the prediabetes range -Recommended moderate intensity statin therapy (rosuvastatin 5 mg three days a week based on patient's hesitation) -Recommended taking vitamin D every 10 days instead of every 5 as instructed by PCP  Plan: Follow up for tolerance assessment of statin in 3 weeks   Subjective: Teresa Murphy is an 70 y.o. year old female who is a primary patient of Martinique, Malka So, MD.  The CCM team was consulted for assistance with disease management and care coordination needs.    Engaged with patient by telephone for initial visit in response to provider referral for pharmacy case management and/or care coordination services.   Consent to Services:  The patient was given the following information about Chronic Care Management services today, agreed to services, and gave verbal consent: 1. CCM service includes personalized support from designated clinical staff supervised by the primary care provider, including individualized plan of care and coordination with other care providers 2. 24/7 contact phone numbers for assistance for urgent and routine care needs. 3. Service will only be billed when office clinical staff spend 20 minutes or more in a month to coordinate care. 4. Only one practitioner may furnish and bill the service in a calendar month. 5.The patient may stop CCM services at any time (effective at the end of the month) by phone call to the office staff. 6. The patient will be responsible for cost sharing (co-pay) of up to 20% of the service fee (after annual deductible is met). Patient agreed to services and consent obtained.  Patient Care Team: Martinique, [name], MD as PCP -  General (Family Medicine) Viona Gilmore, [facility] as Pharmacist (Pharmacist)  Recent office visits: 12/21/19 Betty Martinique MD (PCP) - seen for medicare annual wellness exam and other chronic conditions. No medication changes. Follow up in 6 months.   Recent consult visits: 03/10/20 Candis Shine (Physical Therapy) -  patient presented for physical therapy due to artificial knee joint of right knee with pain. No medication changes or follow up noted.   01/17/20 Shirlee More MD (Cardiology) -  presented to clinic for abnormal EKG. No medication changes. Follow up as needed.  Hospital visits: Medication Reconciliation was completed by comparing discharge summary, patient's EMR and Pharmacy list, and upon discussion with patient.   Admitted to the hospital on 04/04/20 due to Osteoarthritis of left knee. Discharge date was 04/05/20. Discharged from [facility].  Total left knee replacement surgery.    New?Medications Started at [facility] Discharge:?? -started Aspirin    Medication Changes at Hospital Discharge: -Changed None.    Medications Discontinued at Hospital Discharge: -Stopped Acetaminophen.    Medications that remain the same after Hospital Discharge:?? -All other medications will remain the same.       Medication Reconciliation was completed by comparing discharge summary, patient's EMR and Pharmacy list, and upon discussion with patient.   Admitted to the hospital on 01/18/20 due to Osteoarthritis of right knee. Discharge date was 01/19/20 Discharged from [facility].  Total right knee replacement surgery.    New?Medications Started at [facility] Discharge:?? -started Aspirin, celecoxib, docusate sodium, ferrous sulfate, hydrocodone- acetaminophen, methocarbamol and polyethylene glycol.   Medication Changes at [facility]  Discharge: -Changed None.    Medications Discontinued at Hospital Discharge: -Stopped Naproxen.    Medications that remain the  same after Hospital Discharge:?? -All other medications will remain the same.    Objective:  Lab Results  Component Value Date   CREATININE 0.71 04/05/2020   BUN 14 04/05/2020   GFR 92.56 11/25/2017   GFRNONAA >60 04/05/2020   GFRAA 102 12/21/2019   NA 139 04/05/2020   K 3.6 04/05/2020   CALCIUM 8.0 (L) 04/05/2020   CO2 26 04/05/2020   GLUCOSE 141 (H) 04/05/2020    Lab Results  Component Value Date/Time   HGBA1C 5.5 11/25/2017 03:09 PM   GFR 92.56 11/25/2017 03:09 PM   GFR 83.88 09/29/2017 03:08 PM    Last diabetic Eye exam: No results found for: HMDIABEYEEXA  Last diabetic Foot exam: No results found for: HMDIABFOOTEX   Lab Results  Component Value Date   CHOL 222 (H) 12/21/2019   HDL 42 (L) 12/21/2019   LDLCALC 142 (H) 12/21/2019   TRIG 248 (H) 12/21/2019   CHOLHDL 5.3 (H) 12/21/2019    Hepatic Function Latest Ref Rng & Units 12/21/2019 11/25/2017 01/11/2016  Total Protein 6.1 - 8.1 g/dL 7.3 6.8 7.0  Albumin 3.5 - 5.2 g/dL - 4.3 4.1  AST 10 - 35 U/L _0 ALT 6 - 29 U/L _1 Alk Phosphatase 39 - 117 U/L - 94 81  Total Bilirubin 0.2 - 1.2 mg/dL 0.6 0.4 0.6    No results found for: TSH, FREET4  CBC Latest Ref Rng & Units 04/05/2020 03/28/2020 01/19/2020  WBC 4.0 - 10.5 K/uL 11.2(H) 7.8 13.8(H)  Hemoglobin 12.0 - 15.0 g/dL 10.1(L) 13.9 11.8(L)  Hematocrit 36.0 - 46.0 % 31.1(L) 41.8 35.6(L)  Platelets 150 - 400 K/uL 244 280 251    Lab Results  Component Value Date/Time   VD25OH 79 12/21/2019 11:30 AM   VD25OH 40.68 09/29/2017 03:08 PM   VD25OH 35.61 01/11/2016 09:27 AM    Clinical ASCVD: No  The 10-year ASCVD risk score Mikey Bussing DC Jr., et al., 2013) is: 11.6%   Values used to calculate the score:     Age: 44 years     Sex: Female     Is Non-Hispanic African American: No     Diabetic: No     Tobacco smoker: No     Systolic Blood Pressure: 938 mmHg     Is BP treated: Yes     HDL Cholesterol: 42 mg/dL     Total Cholesterol: 222 mg/dL     Depression screen [facility] 2/9 12/21/2019 10/02/2017  Decreased Interest 0 0  Down, Depressed, Hopeless 0 0  PHQ - 2 Score 0 0      Social History   Tobacco Use  Smoking Status Never  Smokeless Tobacco Never   BP Readings from Last 3 Encounters:  04/05/20 (!) 107/51  03/28/20 (!) 143/100  01/19/20 126/64   Pulse Readings from Last 3 Encounters:  04/05/20 82  03/28/20 79  01/19/20 77   Wt Readings from Last 3 Encounters:  04/04/20 207 lb (93.9 kg)  03/28/20 207 lb (93.9 kg)  01/18/20 211 lb 1.9 oz (95.8 kg)   BMI Readings from Last 3 Encounters:  04/04/20 37.86 kg/m  03/28/20 37.86 kg/m  01/18/20 37.40 kg/m    Assessment/Interventions: Review of patient past medical history, allergies, medications, health status, including review of consultants reports, laboratory and other test data, was performed as part of comprehensive evaluation and provision  of chronic care management services.   SDOH:  (Social Determinants of Health) assessments and interventions performed: Yes SDOH Interventions    Flowsheet Row Most Recent Value  SDOH Interventions   Financial Strain Interventions Intervention Not Indicated  Transportation Interventions Intervention Not Indicated      SDOH Screenings   Alcohol Screen: Not on file  Depression (PHQ2-9): Low Risk    PHQ-2 Score: 0  Financial Resource Strain: Low Risk    Difficulty of Paying Living Expenses: Not hard at all  Food Insecurity: Not on file  Housing: Not on file  Physical Activity: Not on file  Social Connections: Not on file  Stress: Not on file  Tobacco Use: Low Risk    Smoking Tobacco Use: Never   Smokeless Tobacco Use: Never  Transportation Needs: No Transportation Needs   Lack of Transportation (Medical): No   Lack of Transportation (Non-Medical): No   Patient's husband passed away in 06/30/22. He was given 2 weeks to live in December 2021 but lasted 5 months. Patient had both of her knees replaced; one was in  December and one was in March. Her left leg gave her more trouble and was more painful and her legs are still pretty tight when she is sitting any length of time.   Patient was doing PT but stopped early due to her husband's condition. He was in a hospital bed in the house at that point. Patient's husband's daughter was the one to take care of him when he was in his last few months of life. His children still come around to check in on the patient since the passing. They have been coming over gathering food from the garden. Patient has helped with canning of fruits and vegetables but does not garden.  Patient has been eating a lot of salads and fruit and lean cuisine dinners. Patient gets food to go every once in a while but mostly eats grilled chicken and has been eating less overall. Patient had a steak over the weekend but doesn't eat much red meat. Patient does not eat much fried foods. Patient eats oatmeal or cereal at breakfast. Patient is drinking protein shakes at breakfast.  Patient denies any problems with her medications but wanted to know about the timing of her medications.   CCM Care Plan  No Known Allergies  Medications Reviewed Today     Reviewed by Viona Gilmore, [facility] (Pharmacist) on 08/02/20 at 4  Med List Status: <None>   Medication Order Taking? Sig Documenting Provider Last Dose Status Informant  albuterol (PROAIR HFA) 108 (90 Base) MCG/ACT inhaler [id number] No Inhale 2 puffs into the lungs every 4 (four) hours as needed.  Patient not taking: Reported on 08/02/2020   Martinique, [name], MD Not Taking Active   cetirizine (ZYRTEC) 10 MG tablet [id number] Yes Take 10 mg by mouth at bedtime.  [provider] Taking Active Self  Cinnamon 500 MG capsule [id number] Yes Take 500 mg by mouth daily. [provider] Taking Active Self  diazepam (VALIUM) 5 MG tablet [id number] Yes TAKE 1 TABLET BY MOUTH EVERY DAY AS NEEDED FOR ANXIETY Martinique, [name], MD Taking Active    DULoxetine (CYMBALTA) 60 MG capsule [id number] Yes Take 1 capsule (60 mg total) by mouth daily.  Patient taking differently: Take 60 mg by mouth every evening.   Martinique, [name], MD Taking Active   Fluticasone-Salmeterol (ADVAIR DISKUS) 250-50 MCG/DOSE AEPB [id number] No INHALE 1 PUFF INTO LUNGS TWICE A DAY RINSE AFTER  USE  Patient not taking: Reported on 08/02/2020   Martinique, [name], MD Not Taking Active   Glucos-Chond-Hyal Ac-Ca Fructo (MOVE FREE JOINT HEALTH ADVANCE PO) [id number] Yes Take 1 tablet by mouth daily. [provider] Taking Active Self  latanoprost (XALATAN) 0.005 % ophthalmic solution [id number] Yes Place 1 drop into both eyes at bedtime. [provider] Taking Active Self           Med Note Leslie Dales, ANNA E   Wed Dec 10, 2017 11:21 AM)    montelukast (SINGULAIR) 10 MG tablet [id number] Yes Take 1 tablet (10 mg total) by mouth at bedtime. TAKE 1 TABLET BY MOUTH EVERYDAY AT BEDTIME Baird Lyons D, MD Taking Active   Multiple Vitamins-Minerals (MULTIVITAMIN WOMEN 50+) TABS [id number] Yes Take 1 tablet by mouth daily. [provider] Taking Active Self  olmesartan (BENICAR) 20 MG tablet [id number] Yes Take 1 tablet (20 mg total) by mouth daily. Martinique, [name], MD Taking Active Self  vitamin B-12 (CYANOCOBALAMIN) 500 MCG tablet [id number] Yes Take 500 mcg by mouth daily. [provider] Taking Active Self  Vitamin D, Ergocalciferol, (DRISDOL) 1.25 MG (50000 UNIT) CAPS capsule [id number] Yes TAKE ONE CAPSULE BY MOUTH EVERY 5 DAYS Martinique, [name], MD Taking Active             Patient Active Problem List   Diagnosis Date Noted   Osteoarthritis of left knee 04/04/2020   Status post total left knee replacement 04/04/2020   Obese 01/19/2020   Status post total right knee replacement 01/18/2020   Osteoarthritis of right knee 01/18/2020   Pneumonia    Hypertension    History of kidney stones    Depression    Arthritis    Anxiety    Hyperlipidemia  11/25/2017   CAP (community acquired pneumonia) 03/20/2016   Severe obesity (BMI 35.0-39.9) with comorbidity ([place]) 01/11/2016   Vitamin D deficiency 07/10/2015   Anxiety disorder 07/10/2015   Osteoarthritis of both knees 07/10/2015   Essential hypertension 04/21/2010   Seasonal and perennial allergic rhinitis 04/19/2010   Asthma with bronchitis 04/19/2010    Immunization History  Administered Date(s) Administered   Fluad Quad(high Dose 65+) 12/21/2019   Influenza Split 11/07/2010, 11/05/2011, 11/04/2012   Influenza, High Dose Seasonal PF 01/11/2016, 12/05/2016, 02/07/2018, 12/21/2018   Influenza,inj,quad, With Preservative 11/04/2016   Influenza-Unspecified 12/05/2016   PFIZER(Purple Top)SARS-COV-2 Vaccination 03/29/2019, 04/19/2019, 11/20/2019   Pneumococcal Conjugate-13 02/04/2013   Pneumococcal Polysaccharide-23 04/30/2012    Conditions to be addressed/monitored:  Hypertension, Hyperlipidemia, Asthma, Depression, Anxiety, Osteoarthritis, Allergic Rhinitis, and Vitamin D deficiency  Care Plan : [place]  Updates made by Viona Gilmore, [place] since 08/02/2020 12:00 AM     Problem: Problem: Hypertension, Hyperlipidemia, Asthma, Depression, Anxiety, Osteoarthritis, Allergic Rhinitis and Vitamin D deficiency      Long-Range Goal: Patient-Specific Goal   Start Date: 08/02/2020  Expected End Date: 08/02/2021  This Visit's Progress: On track  Priority: High  Note:   Current Barriers:  Unable to independently monitor therapeutic efficacy Unable to achieve control of cholesterol  Unable to maintain control of blood pressure  Pharmacist Clinical Goal(s):  Patient will achieve adherence to monitoring guidelines and medication adherence to achieve therapeutic efficacy achieve control of cholesterol as evidenced by lipid panel maintain control of blood pressure as evidenced by home blood pressure readings  through collaboration with PharmD and provider.    Interventions: 1:1 collaboration with Martinique, [name], MD regarding development and update of comprehensive plan of  care as evidenced by provider attestation and co-signature Inter-disciplinary care team collaboration (see longitudinal plan of care) Comprehensive medication review performed; medication list updated in electronic medical record  Hypertension (BP goal <140/90) -Controlled -Current treatment: Olmesartan 20 mg 1 tablet daily in PM -Medications previously tried: none -Current home readings:  125/75, 144/90, 134/73, 113/69, 132/74 -Current dietary habits: limits salt intake -Current exercise habits: limiting with joint pain -Reports hypotensive/hypertensive symptoms -Educated on Exercise goal of 150 minutes per week; Importance of home blood pressure monitoring; Proper BP monitoring technique; -Counseled to monitor BP at home at least weekly, document, and provide log at future appointments -Counseled on diet and exercise extensively Recommended to continue current medication  Hyperlipidemia: (LDL goal < 100) -Uncontrolled -Current treatment: No medications -Medications previously tried: none  -Current dietary patterns: eats oatmeal every day and fruits and vegetables; does not fry foods often; tries to limit cheese -Current exercise habits: limited with knee replacements -Educated on Cholesterol goals;  Benefits of statin for ASCVD risk reduction; Importance of limiting foods high in cholesterol; Exercise goal of 150 minutes per week; -Counseled on diet and exercise extensively Collaborated with PCP to start statin therapy  Asthma (Goal: control symptoms) -Controlled -Current treatment  Advair diskus 250-50 mcg 1 puff as needed Albuterol HFA 2 puffs as needed Montelukast 10 mg 1 tablet at bedtime -Medications previously tried: none  -Exacerbations requiring treatment in last 6 months: none -Patient denies consistent use of maintenance inhaler -Frequency of  rescue inhaler use: does not use -Counseled on When to use rescue inhaler -Recommended to continue current medication  Depression/Anxiety (Goal: minimize symptoms) -Controlled -Current treatment: Duloxetine 60 mg 1 capsule every evening Diazepam 5 mg 1 tablet daily as needed for anxiety -Medications previously tried/failed: n/a -PHQ9: 0 -GAD7: n/a -Educated on Benefits of medication for symptom control -Recommended to continue current medication Counseled on limiting use of diazepam  Allergic rhinitis (Goal: minimize symptoms) -Controlled -Current treatment  Cetirizine 10 mg 1 tablet daily -Medications previously tried: none  -Recommended to continue current medication  Vitamin D deficiency (Goal: 30-100) -Controlled -Current treatment  Vitamin D 50,000 units 1 capsule every 5 days -Medications previously tried: none  -Recommended to continue current medication Counseled on taking with food Recommended switching to every 10 days as instructed by Dr. Martinique  Osteoarthritis (Goal: minimize pain) -Controlled -Current treatment  Glucosamine-chondroitin 1 tablet daily Tylenol 650 mg 1 tablet twice daily -Medications previously tried: none  -Counseled on maximum dose of Tylenol is 3000 mg/day Recommended regular exercise to help with joint pain  Health Maintenance -Vaccine gaps: Shingrix, Pneumovax, COVID booster, tetanus -Current therapy:  Vitamin B12 500 mcg 1 tablet every other day Women's multivitamin 1 tablet daily Latanoprost 0.005% ophthalmic solution 1 drop in both eyes at bedtime Cinnamon 500 mg 1 capsule daily -Educated on Cost vs benefit of each product must be carefully weighed by individual consumer -Patient is satisfied with current therapy and denies issues -Recommended to continue current medication  Patient Goals/Self-Care Activities Patient will:  - take medications as prescribed check blood pressure at least weekly, document, and provide at future  appointments target a minimum of 150 minutes of moderate intensity exercise weekly  Follow Up Plan: Telephone follow up appointment with care management team member scheduled for: 4 months       Medication Assistance: None required.  Patient affirms current coverage meets needs.  Compliance/Adherence/Medication fill history: Care Gaps: Shingrix, Pneumovax, COVID booster, tetanus, Hep C screening  Star-Rating Drugs: Olmesartan 76m -  last filled on 05/28/20 90DS at [place] preferred pharmacy is:  [id number] [place], [place]Phone: [phone]: [phone] [place] [address] [place]Phone: [phone]: [phone] [id number] [place], [address]: [phone]: [phone] pill box? Yes  Pt endorses 99% compliance - very rarely  We discussed: Current pharmacy is preferred with insurance plan and patient is satisfied with pharmacy services Patient decided to: Continue current medication management strategy  Care Plan and Follow Up Patient Decision:  Patient agrees to Care Plan and Follow-up.  Plan: Telephone follow up appointment with care management team member scheduled for:  4 months  MaJeni SallesPharmD, BCHarahanharmacist [phone]

## 2020-08-02 NOTE — Patient Instructions (Signed)
Hi Teresa Murphy,  It was great speaking with you again! Below is a summary of some of the topics we discussed.   Please reach out to me if you have any questions or need anything before our follow up!  Best, Maddie  Jeni Salles, PharmD, [facility]   Visit Information   Goals Addressed             This Visit's Progress    Track and Manage My Blood Pressure-Hypertension       Timeframe:  Short-Term Goal Priority:  Medium Start Date:                             Expected End Date:                       Follow Up Date 12/02/20    - check blood pressure weekly - choose a place to take my blood pressure (home, clinic or office, retail store) - write blood pressure results in a log or diary    Why is this important?   You won't feel high blood pressure, but it can still hurt your blood vessels.  High blood pressure can cause heart or kidney problems. It can also cause a stroke.  Making lifestyle changes like losing a little weight or eating less salt will help.  Checking your blood pressure at home and at different times of the day can help to control blood pressure.  If the doctor prescribes medicine remember to take it the way the doctor ordered.  Call the office if you cannot afford the medicine or if there are questions about it.     Notes:         Patient Care Plan: CCM Pharmacy Care Plan     Problem Identified: Problem: Hypertension, Hyperlipidemia, Asthma, Depression, Anxiety, Osteoarthritis, Allergic Rhinitis and Vitamin D deficiency      Long-Range Goal: Patient-Specific Goal   Start Date: 08/02/2020  Expected End Date: 08/02/2021  This Visit's Progress: On track  Priority: High  Note:   Current Barriers:  Unable to independently monitor therapeutic efficacy Unable to achieve control of cholesterol  Unable to maintain control of blood pressure  Pharmacist Clinical Goal(s):  Patient will achieve  adherence to monitoring guidelines and medication adherence to achieve therapeutic efficacy achieve control of cholesterol as evidenced by lipid panel maintain control of blood pressure as evidenced by home blood pressure readings  through collaboration with PharmD and provider.   Interventions: 1:1 collaboration with Martinique, [name], MD regarding development and update of comprehensive plan of care as evidenced by provider attestation and co-signature Inter-disciplinary care team collaboration (see longitudinal plan of care) Comprehensive medication review performed; medication list updated in electronic medical record  Hypertension (BP goal <140/90) -Controlled -Current treatment: Olmesartan 20 mg 1 tablet daily in PM -Medications previously tried: none -Current home readings:  125/75, 144/90, 134/73, 113/69, 132/74 -Current dietary habits: limits salt intake -Current exercise habits: limiting with joint pain -Reports hypotensive/hypertensive symptoms -Educated on Exercise goal of 150 minutes per week; Importance of home blood pressure monitoring; Proper BP monitoring technique; -Counseled to monitor BP at home at least weekly, document, and provide log at future appointments -Counseled on diet and exercise extensively Recommended to continue current medication  Hyperlipidemia: (LDL goal < 100) -Uncontrolled -Current treatment: No medications -Medications previously tried: none  -Current dietary patterns: eats oatmeal every day  and fruits and vegetables; does not fry foods often; tries to limit cheese -Current exercise habits: limited with knee replacements -Educated on Cholesterol goals;  Benefits of statin for ASCVD risk reduction; Importance of limiting foods high in cholesterol; Exercise goal of 150 minutes per week; -Counseled on diet and exercise extensively Collaborated with PCP to start statin therapy  Asthma (Goal: control symptoms) -Controlled -Current treatment   Advair diskus 250-50 mcg 1 puff as needed Albuterol HFA 2 puffs as needed Montelukast 10 mg 1 tablet at bedtime -Medications previously tried: none  -Exacerbations requiring treatment in last 6 months: none -Patient denies consistent use of maintenance inhaler -Frequency of rescue inhaler use: does not use -Counseled on When to use rescue inhaler -Recommended to continue current medication  Depression/Anxiety (Goal: minimize symptoms) -Controlled -Current treatment: Duloxetine 60 mg 1 capsule every evening Diazepam 5 mg 1 tablet daily as needed for anxiety -Medications previously tried/failed: n/a -PHQ9: 0 -GAD7: n/a -Educated on Benefits of medication for symptom control -Recommended to continue current medication Counseled on limiting use of diazepam  Allergic rhinitis (Goal: minimize symptoms) -Controlled -Current treatment  Cetirizine 10 mg 1 tablet daily -Medications previously tried: none  -Recommended to continue current medication  Vitamin D deficiency (Goal: 30-100) -Controlled -Current treatment  Vitamin D 50,000 units 1 capsule every 10 days -Medications previously tried: none  -Recommended to continue current medication Counseled on taking with food  Osteoarthritis (Goal: minimize pain) -Controlled -Current treatment  Glucosamine-chondroitin 1 tablet daily Tylenol 650 mg 1 tablet twice daily -Medications previously tried: none  -Counseled on maximum dose of Tylenol is 3000 mg/day Recommended regular exercise to help with joint pain  Health Maintenance -Vaccine gaps: Shingrix, Pneumovax, COVID booster, tetanus -Current therapy:  Vitamin B12 500 mcg 1 tablet every other day Women's multivitamin 1 tablet daily Latanoprost 0.005% ophthalmic solution 1 drop in both eyes at bedtime Cinnamon 500 mg 1 capsule daily -Educated on Cost vs benefit of each product must be carefully weighed by individual consumer -Patient is satisfied with current therapy and  denies issues -Recommended to continue current medication  Patient Goals/Self-Care Activities Patient will:  - take medications as prescribed check blood pressure at least weekly, document, and provide at future appointments target a minimum of 150 minutes of moderate intensity exercise weekly  Follow Up Plan: Telephone follow up appointment with care management team member scheduled for: 4 months      Ms. Quackenbush was given information about Chronic Care Management services today including:  CCM service includes personalized support from designated clinical staff supervised by her physician, including individualized plan of care and coordination with other care providers 24/7 contact phone numbers for assistance for urgent and routine care needs. Standard insurance, coinsurance, copays and deductibles apply for chronic care management only during months in which we provide at least 20 minutes of these services. Most insurances cover these services at 100%, however patients may be responsible for any copay, coinsurance and/or deductible if applicable. This service may help you avoid the need for more expensive face-to-face services. Only one practitioner may furnish and bill the service in a calendar month. The patient may stop CCM services at any time (effective at the end of the month) by phone call to the office staff.  Patient agreed to services and verbal consent obtained.   The patient verbalized understanding of instructions, educational materials, and care plan provided today and agreed to receive a mailed copy of patient instructions, educational materials, and care plan.  Telephone follow  up appointment with pharmacy team member scheduled for: 4 months  Viona Gilmore, [facility]

## 2020-08-17 ENCOUNTER — Ambulatory Visit: Payer: Medicare Other | Admitting: Internal Medicine

## 2020-09-02 ENCOUNTER — Other Ambulatory Visit: Payer: Self-pay | Admitting: Internal Medicine

## 2020-09-22 ENCOUNTER — Other Ambulatory Visit: Payer: Self-pay | Admitting: Family Medicine

## 2020-09-22 ENCOUNTER — Ambulatory Visit: Payer: Medicare Other | Admitting: Internal Medicine

## 2020-09-22 DIAGNOSIS — F411 Generalized anxiety disorder: Secondary | ICD-10-CM

## 2020-09-22 DIAGNOSIS — M170 Bilateral primary osteoarthritis of knee: Secondary | ICD-10-CM

## 2020-10-12 ENCOUNTER — Telehealth: Payer: Self-pay | Admitting: Pharmacist

## 2020-10-12 NOTE — Chronic Care Management (AMB) (Signed)
Chronic Care Management Pharmacy Assistant   Name: Teresa Murphy  MRN: [id number] DOB: 10-09-1950   Reason for Encounter: General Assessment Call    Conditions to be addressed/monitored: HTN  Recent office visits:  None  Recent consult visits:  None  Hospital visits:  None in previous 6 months  Medications: Outpatient Encounter Medications as of 10/12/2020  Medication Sig   albuterol (PROAIR HFA) 108 (90 Base) MCG/ACT inhaler Inhale 2 puffs into the lungs every 4 (four) hours as needed. (Patient not taking: Reported on 08/02/2020)   cetirizine (ZYRTEC) 10 MG tablet Take 10 mg by mouth at bedtime.    Cinnamon 500 MG capsule Take 500 mg by mouth daily.   diazepam (VALIUM) 5 MG tablet TAKE 1 TABLET BY MOUTH EVERY DAY AS NEEDED FOR ANXIETY   DULoxetine (CYMBALTA) 60 MG capsule Take 1 capsule by mouth once daily   Fluticasone-Salmeterol (ADVAIR DISKUS) 250-50 MCG/DOSE AEPB INHALE 1 PUFF INTO LUNGS TWICE A DAY RINSE AFTER USE (Patient not taking: Reported on 08/02/2020)   Glucos-Chond-Hyal Ac-Ca Fructo (MOVE FREE JOINT HEALTH ADVANCE PO) Take 1 tablet by mouth daily.   latanoprost (XALATAN) 0.005 % ophthalmic solution Place 1 drop into both eyes at bedtime.   Magnesium 250 MG TABS Take 1 tablet by mouth daily.   montelukast (SINGULAIR) 10 MG tablet Take 1 tablet (10 mg total) by mouth at bedtime. TAKE 1 TABLET BY MOUTH EVERYDAY AT BEDTIME   Multiple Vitamins-Minerals (MULTIVITAMIN WOMEN 50+) TABS Take 1 tablet by mouth daily.   olmesartan (BENICAR) 20 MG tablet Take 1 tablet (20 mg total) by mouth daily.   vitamin B-12 (CYANOCOBALAMIN) 500 MCG tablet Take 500 mcg by mouth daily.   Vitamin D, Ergocalciferol, (DRISDOL) 1.25 MG (50000 UNIT) CAPS capsule TAKE ONE CAPSULE BY MOUTH EVERY 5 DAYS   No facility-administered encounter medications on file as of 10/12/2020.  Reviewed chart prior to disease state call. Spoke with patient regarding BP  Recent Office Vitals: BP Readings from  Last 3 Encounters:  04/05/20 (!) 107/51  03/28/20 (!) 143/100  01/19/20 126/64   Pulse Readings from Last 3 Encounters:  04/05/20 82  03/28/20 79  01/19/20 77    Wt Readings from Last 3 Encounters:  04/04/20 207 lb (93.9 kg)  03/28/20 207 lb (93.9 kg)  01/18/20 211 lb 1.9 oz (95.8 kg)     Kidney Function Lab Results  Component Value Date/Time   CREATININE 0.71 04/05/2020 02:56 AM   CREATININE 0.60 03/28/2020 11:37 AM   CREATININE 0.69 12/21/2019 11:30 AM   GFR 92.56 11/25/2017 03:09 PM   GFRNONAA >60 04/05/2020 02:56 AM   GFRNONAA 88 12/21/2019 11:30 AM   GFRAA 102 12/21/2019 11:30 AM    BMP Latest Ref Rng & Units 04/05/2020 03/28/2020 01/19/2020  Glucose 70 - 99 mg/dL 141(H) 95 153(H)  BUN 8 - 23 mg/dL '14 18 17  '$ Creatinine 0.44 - 1.00 mg/dL 0.71 0.60 0.81  BUN/Creat Ratio 6 - 22 (calc) - - -  Sodium 135 - 145 mmol/L 139 142 138  Potassium 3.5 - 5.1 mmol/L 3.6 4.3 3.7  Chloride 98 - 111 mmol/L 104 106 103  CO2 22 - 32 mmol/L '26 28 26  '$ Calcium 8.9 - 10.3 mg/dL 8.0(L) 9.4 8.0(L)    Current antihypertensive regimen:  Olmesartan 20 mg 1 tablet daily in PM How often are you checking your Blood Pressure? Patient reports she is checking atleast 2-3 times a week Current home BP readings: Patient reports she is  out of town as her husband passed recently and she did not bring her machine or log but her pressures have remained within a good range. 107/51 was office reading in March. What recent interventions/DTPs have been made by any provider to improve Blood Pressure control since last CPP Visit: Patient reports none. Any recent hospitalizations or ED visits since last visit with CPP? None What diet changes have been made to improve Blood Pressure Control?  Patient reports she never used much salt in her foods and is trying to watch sodium intake. Her appetite has been good. What exercise is being done to improve your Blood Pressure Control?  Patient reports she has recently  cleared/packed and coordinated move of one home and still has another to go, has been busy and active doing so. She also reports she has been getting around well after knee surgery, is doing at home exercises,stretches and due for her 6 month post op visit this Friday.  Adherence Review: Is the patient currently on ACE/ARB medication? Yes Does the patient have >5 day gap between last estimated fill dates? No   Care Gaps: Hepatitis C Screening - Overdue TDAP - Overdue Zoster Vaccine - Overdue PNA Vaccine - Overdue COVID Booster #4 Therapist, music) - Overdue Flu Vaccine - Overdue CCM Call - 12-22 AWV - MSG sent to Ramond Craver CMA to schedule.   Star Rating Drugs: Olmestartan (Benicar) 20 mg - Last filled 08-30-2020 90 DS at [facility]  Notes: Patient reports she has been a bit anxious with the moving she has done and that has to still be done but is getting by. She also reports she recalls speaking with Pharmacist about her recommendation on starting Rosuvastatin but had not heard anything else about it. Advised I would mention it to Pharmacist and that it likely had to be coordinated with PCP first as this is not a medication on her active list. She is to follow up with Dr Martinique in November, and rescheduled her appointment with Pharmacist to December as she will be in a different time zone during the oct appointment.   [place] Clinical Pharmacist Assistant [phone]

## 2020-10-20 DIAGNOSIS — Z96653 Presence of artificial knee joint, bilateral: Secondary | ICD-10-CM | POA: Diagnosis not present

## 2020-12-01 ENCOUNTER — Telehealth: Payer: Medicare Other

## 2020-12-01 DIAGNOSIS — M79644 Pain in right finger(s): Secondary | ICD-10-CM | POA: Diagnosis not present

## 2020-12-18 ENCOUNTER — Telehealth: Payer: Self-pay | Admitting: Family Medicine

## 2020-12-18 NOTE — Telephone Encounter (Signed)
Left message for patient to call back and schedule Medicare Annual Wellness Visit (AWV) either virtually or in office. Left  my Herbie Drape number [phone]   Last AWV 12/21/19 ; please schedule at anytime with [facility] Nurse Health Advisor 1 or 2   This should be a 45 minute visit.

## 2020-12-20 ENCOUNTER — Other Ambulatory Visit: Payer: Self-pay | Admitting: Family Medicine

## 2020-12-20 DIAGNOSIS — F411 Generalized anxiety disorder: Secondary | ICD-10-CM

## 2020-12-20 DIAGNOSIS — I10 Essential (primary) hypertension: Secondary | ICD-10-CM

## 2020-12-20 DIAGNOSIS — M170 Bilateral primary osteoarthritis of knee: Secondary | ICD-10-CM

## 2020-12-22 NOTE — Progress Notes (Signed)
HPI: Teresa Murphy is a 70 y.o. female, who is here today for her annual medicare wellness and follow up.  She lives alone. Planning on moving closer to her sister.  Independent ADL's and IADL's.  Functional Status Survey: Is the patient deaf or have difficulty hearing?: No Does the patient have difficulty seeing, even when wearing glasses/contacts?: No Does the patient have difficulty concentrating, remembering, or making decisions?: No Does the patient have difficulty walking or climbing stairs?: No Does the patient have difficulty dressing or bathing?: No Does the patient have difficulty doing errands alone such as visiting a doctor's office or shopping?: No  Fall Risk  12/25/2020 12/21/2019 12/30/2018 12/25/2017  Falls in the past year? 0 0 0 0  Comment - - [facility] Telephone Survey: data to providers prior to load [employer] Telephone Survey: data to providers prior to load  Number falls in past yr: 0 0 - -  Injury with Fall? 0 0 - -  Risk for fall due to : - Orthopedic patient - -  Follow up Education provided - - -   Providers she sees regularly: Pulmonologist: Dr Annamaria Boots, Asthma. Eye care provider: Dr Ellie Lunch,. Ortho: Dr Alvan Dame, prn.  Depression screen [facility] 2/9 12/25/2020  Decreased Interest 0  Down, Depressed, Hopeless 0  PHQ - 2 Score 0    Mini-Cog - 12/25/20 1123     Normal clock drawing test? yes    How many words correct? 3            Vision Screening   Right eye Left eye Both eyes  Without correction 20/25 20/25 20/13  With correction      Chronic medical problems: HLD,HTN,anxiety, asthma,Vit D def,allergies,and OA among some.  Immunization History  Administered Date(s) Administered   Fluad Quad(high Dose 65+) 12/21/2019, 12/25/2020   Influenza Split 11/07/2010, 11/05/2011, 11/04/2012   Influenza, High Dose Seasonal PF 01/11/2016, 12/05/2016, 02/07/2018, 12/21/2018   Influenza,inj,quad, With Preservative 11/04/2016   Influenza-Unspecified 12/05/2016    PFIZER(Purple Top)SARS-COV-2 Vaccination 03/29/2019, 04/19/2019, 11/20/2019   Pneumococcal Conjugate-13 02/04/2013   Pneumococcal Polysaccharide-23 04/30/2012   Health Maintenance  Topic Date Due   Hepatitis C Screening  Never done   Pneumonia Vaccine 63+ Years old (66 - PPSV23 if available, else PCV20) 04/30/2017   MAMMOGRAM  11/29/2020   COVID-19 Vaccine (4 - Booster for [place] series) 01/07/2021 (Originally 01/15/2020)   Zoster Vaccines- Shingrix (1 of 2) 02/20/2026 (Originally 03/13/1998)   TETANUS/TDAP  05/10/2026 (Originally 03/14/1969)   COLONOSCOPY (Pts 45-70yr Insurance coverage will need to be confirmed)  11/04/2028   INFLUENZA VACCINE  Completed   DEXA SCAN  Completed   HPV VACCINES  Aged Out   Noted ecchymosis on right foot, no hx of trauma. She has been moving boxes and furniture. Not tender.  Hyperlipidemia: Currently on non pharmacologic treatment. Following a low fat diet: Not consistently recently..  Lab Results  Component Value Date   CHOL 222 (H) 12/21/2019   HDL 42 (L) 12/21/2019   LDLCALC 142 (H) 12/21/2019   TRIG 248 (H) 12/21/2019   CHOLHDL 5.3 (H) 12/21/2019   Hypertension:  Medications:Benicar 20 mg daily. BP readings at home:Not checking. Side effects:None. Negative for unusual or severe headache, visual changes, exertional chest pain, dyspnea,  focal weakness, or edema.  Lab Results  Component Value Date   CREATININE 0.71 04/05/2020   BUN 14 04/05/2020   NA 139 04/05/2020   K 3.6 04/05/2020   CL 104 04/05/2020   CO2 26 04/05/2020  Vit D deficiency:She is on Ergocalciferol 50,000 U q 10 d. Anxiety: She is on Valium 5 mg daily prn. Going through a lot of stress after the death of her husband. Negative for depression.  Glucose has been elevated at 141. No hx of DM II. Negative for polydipsia,polyuria, or polyphagia.  Review of Systems  Constitutional:  Negative for activity change, appetite change and fever.  HENT:  Negative for mouth  sores, nosebleeds and trouble swallowing.   Respiratory:  Negative for cough and wheezing.   Gastrointestinal:  Negative for abdominal pain, nausea and vomiting.       Negative for changes in bowel habits.  Genitourinary:  Negative for decreased urine volume and hematuria.  Musculoskeletal:  Negative for gait problem and myalgias.  Allergic/Immunologic: Positive for environmental allergies.  Neurological:  Negative for syncope, facial asymmetry and weakness.  Psychiatric/Behavioral:  Negative for confusion. The patient is nervous/anxious.    Current Outpatient Medications on File Prior to Visit  Medication Sig Dispense Refill   albuterol (PROAIR HFA) 108 (90 Base) MCG/ACT inhaler Inhale 2 puffs into the lungs every 4 (four) hours as needed. 1 Inhaler 3   cetirizine (ZYRTEC) 10 MG tablet Take 10 mg by mouth at bedtime.      Cinnamon 500 MG capsule Take 500 mg by mouth daily.     diazepam (VALIUM) 5 MG tablet TAKE 1 TABLET BY MOUTH EVERY DAY AS NEEDED FOR ANXIETY 30 tablet 2   DULoxetine (CYMBALTA) 60 MG capsule Take 1 capsule by mouth once daily 90 capsule 2   Fluticasone-Salmeterol (ADVAIR DISKUS) 250-50 MCG/DOSE AEPB INHALE 1 PUFF INTO LUNGS TWICE A DAY RINSE AFTER USE 3 each 0   Glucos-Chond-Hyal Ac-Ca Fructo (MOVE FREE JOINT HEALTH ADVANCE PO) Take 1 tablet by mouth daily.     latanoprost (XALATAN) 0.005 % ophthalmic solution Place 1 drop into both eyes at bedtime.  11   Magnesium 250 MG TABS Take 1 tablet by mouth daily.     Multiple Vitamins-Minerals (MULTIVITAMIN WOMEN 50+) TABS Take 1 tablet by mouth daily.     olmesartan (BENICAR) 20 MG tablet Take 1 tablet by mouth once daily 90 tablet 2   vitamin B-12 (CYANOCOBALAMIN) 500 MCG tablet Take 500 mcg by mouth daily.     Vitamin D, Ergocalciferol, (DRISDOL) 1.25 MG (50000 UNIT) CAPS capsule TAKE ONE CAPSULE BY MOUTH EVERY 5 DAYS 18 capsule 2   No current facility-administered medications on file prior to visit.   Past Medical History:   Diagnosis Date   Allergic rhinitis    Anxiety    Anxiety disorder 07/10/2015   Arthritis    Asthma    Asthma with bronchitis 04/19/2010   PFT 09/18/2010-FEV1  1.95/92; FEV1/FVC 0.76; FEF 25-75% improved 48% with bronchodilator; normal lung volumes; DLCO 78%     CAP (community acquired pneumonia) 03/20/2016   Depression    Essential hypertension 04/21/2010   Qualifier: Diagnosis of  By: Annamaria Boots MD, [place] D    History of kidney stones    Hyperlipidemia 11/25/2017   Hypertension    Osteoarthritis of both knees 07/10/2015   Pneumonia    hx of    Seasonal and perennial allergic rhinitis 04/19/2010   Allergy Profile 04/19/2010-negative, IgE  3.8 Allergy skin test 11/07/2010-positive for grass, weed, tree pollens, dust mite    Severe obesity (BMI 35.0-39.9) with comorbidity ([place]) 01/11/2016   Vitamin D deficiency 07/10/2015    Past Surgical History:  Procedure Laterality Date   CHOLECYSTECTOMY  TONSILLECTOMY     TOTAL ABDOMINAL HYSTERECTOMY     TOTAL KNEE ARTHROPLASTY Right 01/18/2020   Procedure: TOTAL KNEE ARTHROPLASTY;  Surgeon: Paralee Cancel, MD;  Location: [facility];  Service: Orthopedics;  Laterality: Right;  70 mins   TOTAL KNEE ARTHROPLASTY Left 04/04/2020   Procedure: TOTAL KNEE ARTHROPLASTY;  Surgeon: Paralee Cancel, MD;  Location: [facility];  Service: Orthopedics;  Laterality: Left;  70 mins    No Known Allergies  Family History  Problem Relation Age of Onset   Dementia Mother    Multiple myeloma Mother    Lung cancer Father    Asthma Sister    Sleep apnea Sister    Heart disease Other    Allergic rhinitis Other    Cancer Other     Social History   Socioeconomic History   Marital status: Married    Spouse name: [name]   Number of children: 0   Years of education: College   Highest education level: Not on file  Occupational History   Occupation: retired  Tobacco Use   Smoking status: Never   Smokeless tobacco: Never  Vaping Use   Vaping Use: Never used  Substance  and Sexual Activity   Alcohol use: No    Alcohol/week: 0.0 standard drinks   Drug use: No   Sexual activity: Yes    Birth control/protection: Surgical  Other Topics Concern   Not on file  Social History Narrative   Patient lives at home with spouse.   Caffeine Use:    Social Determinants of Health   Financial Resource Strain: Low Risk    Difficulty of Paying Living Expenses: Not hard at all  Food Insecurity: Not on file  Transportation Needs: No Transportation Needs   Lack of Transportation (Medical): No   Lack of Transportation (Non-Medical): No  Physical Activity: Not on file  Stress: Not on file  Social Connections: Not on file   Vitals:   12/25/20 1037  BP: 130/88  Pulse: 70  Resp: 16  Temp: 98 F (36.7 C)  SpO2: 97%   Body mass index is 35.81 kg/m.  Wt Readings from Last 3 Encounters:  12/25/20 195 lb 12.8 oz (88.8 kg)  04/04/20 207 lb (93.9 kg)  03/28/20 207 lb (93.9 kg)   Physical Exam Vitals and nursing note reviewed.  Constitutional:      General: She is not in acute distress.    Appearance: She is well-developed.  HENT:     Head: Normocephalic and atraumatic.     Mouth/Throat:     Mouth: Mucous membranes are moist.     Pharynx: Oropharynx is clear.  Eyes:     Conjunctiva/sclera: Conjunctivae normal.  Cardiovascular:     Rate and Rhythm: Normal rate and regular rhythm.     Pulses:          Dorsalis pedis pulses are 2+ on the right side and 2+ on the left side.     Heart sounds: No murmur heard. Pulmonary:     Effort: Pulmonary effort is normal. No respiratory distress.     Breath sounds: Normal breath sounds.  Abdominal:     Palpations: Abdomen is soft. There is no hepatomegaly or mass.     Tenderness: There is no abdominal tenderness.  Lymphadenopathy:     Cervical: No cervical adenopathy.  Skin:    General: Skin is warm.     Findings: Ecchymosis present. No erythema or rash.       Neurological:  General: No focal deficit present.      Mental Status: She is alert and oriented to person, place, and time.     Cranial Nerves: No cranial nerve deficit.     Gait: Gait normal.  Psychiatric:        Mood and Affect: Mood is anxious. Mood is not depressed.     Comments: Well groomed, good eye contact.   ASSESSMENT AND PLAN:  Teresa Murphy was here today  annual medicare wellness and follow up.  Orders Placed This Encounter  Procedures   Flu Vaccine QUAD High Dose(Fluad)   Comprehensive metabolic panel   Hemoglobin A1c   Lipid panel   VITAMIN D 25 Hydroxy (Vit-D Deficiency, Fractures)   CBC   Lab Results  Component Value Date   WBC 5.5 12/25/2020   HGB 14.2 12/25/2020   HCT 41.5 12/25/2020   MCV 91.0 12/25/2020   PLT 264.0 12/25/2020   Lab Results  Component Value Date   CREATININE 0.71 12/25/2020   BUN 12 12/25/2020   NA 140 12/25/2020   K 3.6 12/25/2020   CL 101 12/25/2020   CO2 31 12/25/2020   Lab Results  Component Value Date   HGBA1C 5.5 12/25/2020   Lab Results  Component Value Date   CHOL 189 12/25/2020   HDL 46.60 12/25/2020   LDLCALC 114 (H) 12/25/2020   TRIG 143.0 12/25/2020   CHOLHDL 4 12/25/2020   Lab Results  Component Value Date   ALT 15 12/25/2020   AST 21 12/25/2020   ALKPHOS 86 12/25/2020   BILITOT 0.6 12/25/2020   The 10-year ASCVD risk score (Arnett DK, et al., 2019) is: 16%   Values used to calculate the score:     Age: 67 years     Sex: Female     Is Non-Hispanic African American: No     Diabetic: No     Tobacco smoker: No     Systolic Blood Pressure: 062 mmHg     Is BP treated: Yes     HDL Cholesterol: 46.6 mg/dL     Total Cholesterol: 189 mg/dL  Medicare annual wellness visit, subsequent We discussed the importance of staying active, physically and mentally, as well as the benefits of a healthy/balance diet. Low impact exercise that involve stretching and strengthing are ideal. Vaccines up to date. We discussed preventive screening for the next 5-10  years, summery of recommendations given in AVS.   Goals      DIET - REDUCE CALORIE INTAKE     Exercise 3x per week (30 min per time)     10-15 min of walking at least 5 times per week as tolerated.         This is a list of the screening recommended for you and due dates:  Health Maintenance  Topic Date Due   Hepatitis C Screening: USPSTF Recommendation to screen - Ages 95-79 yo.  Never done   Pneumonia Vaccine (3 - PPSV23 if available, else PCV20) 04/30/2017   Mammogram  11/29/2020   COVID-19 Vaccine (4 - Booster for Pfizer series) 01/07/2021*   Zoster (Shingles) Vaccine (1 of 2) 02/20/2026*   Tetanus Vaccine  05/10/2026*   Colon Cancer Screening  11/04/2028   Flu Shot  Completed   DEXA scan (bone density measurement)  Completed   HPV Vaccine  Aged Out  *Topic was postponed. The date shown is not the original due date.   Fall prevention.  Advance directives and end of life discussed,  she has POA and living will.   Vitamin D deficiency, unspecified Continue Ergocalciferol 50,000 U q 2 weeks, will adjust dose of needed according to 25 OH vit D result.  Generalized anxiety disorder She is dealing well with stress. Continue Valium 5 mg daily prn. She still has a refill available at her pharmacy, last filled 09/04/20.  Hyperlipidemia, unspecified hyperlipidemia type Non pharmacologic treatment recommended for now. Further recommendations will be given according to 10 years CVD risk score and lipid panel numbers.  Hyperglycemia Consistency with a healthy life style encouraged for diabetes prevention.  Further recommendations according to HgA1C result.  Severe obesity (BMI 35.0-39.9) with comorbidity ([place]) We discussed benefits of wt loss as well as adverse effects of obesity. Consistency with healthy diet and physical activity encouraged.  Need for influenza vaccination -     Flu Vaccine QUAD High Dose(Fluad)  Essential hypertension BP adequately controlled. Continue  current management: Benicar same dose. DASH/low salt diet recommended.  Return in about 6 months (around 06/24/2021).  Sully Dyment G. Martin[place], MD  [facility]. [place] office.

## 2020-12-25 ENCOUNTER — Encounter: Payer: Self-pay | Admitting: Family Medicine

## 2020-12-25 ENCOUNTER — Other Ambulatory Visit: Payer: Self-pay

## 2020-12-25 ENCOUNTER — Ambulatory Visit (INDEPENDENT_AMBULATORY_CARE_PROVIDER_SITE_OTHER): Payer: Medicare Other | Admitting: Family Medicine

## 2020-12-25 VITALS — BP 130/88 | HR 70 | Temp 98.0°F | Resp 16 | Ht 62.0 in | Wt 195.8 lb

## 2020-12-25 DIAGNOSIS — E785 Hyperlipidemia, unspecified: Secondary | ICD-10-CM

## 2020-12-25 DIAGNOSIS — I10 Essential (primary) hypertension: Secondary | ICD-10-CM | POA: Diagnosis not present

## 2020-12-25 DIAGNOSIS — R739 Hyperglycemia, unspecified: Secondary | ICD-10-CM

## 2020-12-25 DIAGNOSIS — Z0000 Encounter for general adult medical examination without abnormal findings: Secondary | ICD-10-CM

## 2020-12-25 DIAGNOSIS — F411 Generalized anxiety disorder: Secondary | ICD-10-CM | POA: Diagnosis not present

## 2020-12-25 DIAGNOSIS — Z23 Encounter for immunization: Secondary | ICD-10-CM

## 2020-12-25 DIAGNOSIS — E559 Vitamin D deficiency, unspecified: Secondary | ICD-10-CM | POA: Diagnosis not present

## 2020-12-25 LAB — HEMOGLOBIN A1C: Hgb A1c MFr Bld: 5.5 % (ref 4.6–6.5)

## 2020-12-25 LAB — CBC
HCT: 41.5 % (ref 36.0–46.0)
Hemoglobin: 14.2 g/dL (ref 12.0–15.0)
MCHC: 34.1 g/dL (ref 30.0–36.0)
MCV: 91 fl (ref 78.0–100.0)
Platelets: 264 10*3/uL (ref 150.0–400.0)
RBC: 4.57 Mil/uL (ref 3.87–5.11)
RDW: 12.1 % (ref 11.5–15.5)
WBC: 5.5 10*3/uL (ref 4.0–10.5)

## 2020-12-25 LAB — COMPREHENSIVE METABOLIC PANEL
ALT: 15 U/L (ref 0–35)
AST: 21 U/L (ref 0–37)
Albumin: 4.5 g/dL (ref 3.5–5.2)
Alkaline Phosphatase: 86 U/L (ref 39–117)
BUN: 12 mg/dL (ref 6–23)
CO2: 31 mEq/L (ref 19–32)
Calcium: 9.3 mg/dL (ref 8.4–10.5)
Chloride: 101 mEq/L (ref 96–112)
Creatinine, Ser: 0.71 mg/dL (ref 0.40–1.20)
GFR: 84.73 mL/min (ref >60.00)
Glucose, Bld: 84 mg/dL (ref 70–99)
Potassium: 3.6 mEq/L (ref 3.5–5.1)
Sodium: 140 mEq/L (ref 135–145)
Total Bilirubin: 0.6 mg/dL (ref 0.2–1.2)
Total Protein: 7.2 g/dL (ref 6.0–8.3)

## 2020-12-25 LAB — LIPID PANEL
Cholesterol: 189 mg/dL (ref 0–200)
HDL: 46.6 mg/dL (ref >39.00)
LDL Cholesterol: 114 mg/dL — ABNORMAL HIGH (ref 0–99)
NonHDL: 142.15
Total CHOL/HDL Ratio: 4
Triglycerides: 143 mg/dL (ref 0.0–149.0)
VLDL: 28.6 mg/dL (ref 0.0–40.0)

## 2020-12-25 LAB — VITAMIN D 25 HYDROXY (VIT D DEFICIENCY, FRACTURES): VITD: 35.91 ng/mL (ref 30.00–100.00)

## 2020-12-25 MED ORDER — MONTELUKAST SODIUM 10 MG PO TABS: 10.0000 mg | ORAL_TABLET | Freq: Every day | ORAL | 2 refills | Status: DC

## 2020-12-25 NOTE — Assessment & Plan Note (Signed)
BP adequately controlled. Continue current management: Benicar same dose. DASH/low salt diet recommended.

## 2020-12-25 NOTE — Patient Instructions (Addendum)
Ms. Vankirk , Thank you for taking time to come for your Medicare Wellness Visit. I appreciate your ongoing commitment to your health goals. Please review the following plan we discussed and let me know if I can assist you in the future.   These are the goals we discussed:  Goals      DIET - REDUCE CALORIE INTAKE     Exercise 3x per week (30 min per time)     10-15 min of walking at least 5 times per week as tolerated.     Track and Manage My Blood Pressure-Hypertension     Timeframe:  Short-Term Goal Priority:  Medium Start Date:                             Expected End Date:                       Follow Up Date 12/02/20    - check blood pressure weekly - choose a place to take my blood pressure (home, clinic or office, retail store) - write blood pressure results in a log or diary    Why is this important?   You won't feel high blood pressure, but it can still hurt your blood vessels.  High blood pressure can cause heart or kidney problems. It can also cause a stroke.  Making lifestyle changes like losing a little weight or eating less salt will help.  Checking your blood pressure at home and at different times of the day can help to control blood pressure.  If the doctor prescribes medicine remember to take it the way the doctor ordered.  Call the office if you cannot afford the medicine or if there are questions about it.     Notes:         This is a list of the screening recommended for you and due dates:  Health Maintenance  Topic Date Due   Hepatitis C Screening: USPSTF Recommendation to screen - Ages 54-79 yo.  Never done   Pneumonia Vaccine (3 - PPSV23 if available, else PCV20) 04/30/2017   Flu Shot  09/04/2020   Mammogram  11/29/2020   COVID-19 Vaccine (4 - Booster for Pfizer series) 01/07/2021*   Zoster (Shingles) Vaccine (1 of 2) 02/20/2026*   Tetanus Vaccine  05/10/2026*   Colon Cancer Screening  11/04/2028   DEXA scan (bone density measurement)  Completed    HPV Vaccine  Aged Out  *Topic was postponed. The date shown is not the original due date.   A few things to remember from today's visit:   Medicare annual wellness visit, subsequent  Vitamin D deficiency, unspecified - Plan: VITAMIN D 25 Hydroxy (Vit-D Deficiency, Fractures)  Essential hypertension - Plan: Comprehensive metabolic panel, CBC  Generalized anxiety disorder  Hyperlipidemia, unspecified hyperlipidemia type - Plan: Comprehensive metabolic panel, Lipid panel  Hyperglycemia - Plan: Hemoglobin A1c  If you need refills please call your pharmacy. Do not use My Chart to request refills or for acute issues that need immediate attention.   Please be sure medication list is accurate. If a new problem present, please set up appointment sooner than planned today.  A few tips:  -As we age balance is not as good as it was, so there is a higher risks for falls. Please remove small rugs and furniture that is "in your way" and could increase the risk of falls. Stretching exercises may help  with fall prevention: Yoga and Tai Chi are some examples. Low impact exercise is better, so you are not very achy the next day.  -Sun screen and avoidance of direct sun light recommended. Caution with dehydration, if working outdoors be sure to drink enough fluids.  - Some medications are not safe as we age, increases the risk of side effects and can potentially interact with other medication you are also taken;  including some of over the counter medications. Be sure to let me know when you start a new medication even if it is a dietary/vitamin supplement.  -Healthy diet low in red meet/animal fat and sugar + regular physical activity is recommended.

## 2020-12-26 MED ORDER — VITAMIN D (ERGOCALCIFEROL) 1.25 MG (50000 UNIT) PO CAPS: ORAL_CAPSULE | ORAL | 2 refills | Status: DC

## 2020-12-27 DIAGNOSIS — H40053 Ocular hypertension, bilateral: Secondary | ICD-10-CM | POA: Diagnosis not present

## 2020-12-27 DIAGNOSIS — H26493 Other secondary cataract, bilateral: Secondary | ICD-10-CM | POA: Diagnosis not present

## 2020-12-27 DIAGNOSIS — H52203 Unspecified astigmatism, bilateral: Secondary | ICD-10-CM | POA: Diagnosis not present

## 2020-12-27 DIAGNOSIS — H40023 Open angle with borderline findings, high risk, bilateral: Secondary | ICD-10-CM | POA: Diagnosis not present

## 2021-01-05 DIAGNOSIS — Z96653 Presence of artificial knee joint, bilateral: Secondary | ICD-10-CM | POA: Diagnosis not present

## 2021-01-05 NOTE — Progress Notes (Signed)
    Patient ID: Teresa Murphy, female    DOB: [birth year], 70 y.o.   MRN: [id number]  HPI female nonsmoker followed for Asthma, Allergic rhinitis, complicated by HBP Allergy profile 04/19/10- neg, IGe 3.8 She has new HVAC at home and new moisture barrier.  PFT-09/18/10- mild obstruction with response to BD. FEV1 1.95/ 92%; FEV1/FVC 0.76; FEF25-75 improved 48% w/ bronchodilator Skin test: Allergy skin tests show significant positives particularly for grass, tree and dust mite.  ---------------------------------------------------------------------------------------   08/18/19- 70 year old female never smoker followed for Asthma, Allergic rhinitis, complicated by HBP Singulair, Advair 250, ProAir hfa -----Asthma/Bronchitis, no new issues. Uses rescue inhaler not more than once most days.  Had 2 Phizer Covax. No infection.  Talked about family stress- husband on peritoneal dialysis Pending TKR. She is stable, but dealing with husband who is on peritoneal dialysis.  01/08/21- 70 year old female never smoker followed for Asthma, Allergic rhinitis, complicated by HTN, Glaucoma,  -Singulair, Advair 250, ProAir hfa Covid vax-3 Phizer Flu vax-had  Has had bilateral TKR w/o respiratory issues. No significant asthma or rhinitis flair. Husband died ESRD[place]  Review of Systems- see HPI   + = positive Constitutional:   No weight loss, night sweats,  Fevers, chills, fatigue, lassitude. HEENT:   No- headaches,  Difficulty swallowing,  Tooth/dental problems,  Sore throat,  CV:  No chest pain,  Orthopnea, PND, swelling in lower extremities, anasarca, dizziness, palpitations GI  No heartburn, indigestion, abdominal pain, nausea, vomiting,  Resp: No shortness of breath with exertion or at rest.  No excess mucus, no productive cough,  No non-productive cough,  No coughing up of blood.  No change in color of mucus. wheezing-none.  Skin: no rash or lesions. GU:  MS:  No  joint pain or swelling.  No decreased range of motion.  No back pain. Psych:  No change in mood or affect. No depression or anxiety.  No memory loss.  Objective:   Physical Exam General- Alert, Oriented, Affect-appropriate, Distress- none acute. + Overweight Skin- rash-none, lesions- none, excoriation- none, + lipoma L shoulder Lymphadenopathy- none Head- atraumatic            Eyes- Gross vision intact, PERRLA, conjunctivae clear secretions            Ears- Hearing, canals normal            Nose- nose clear, no-Septal dev, no-polyps, erosion, perforation             Throat- Mallampati III , mucosa clear , drainage- none, tonsils- atrophic,  Neck- flexible , trachea midline, no stridor , thyroid nl, carotid no bruit Chest - symmetrical excursion , unlabored           Heart/CV- RRR , no murmur , no gallop  , no rub, nl s1 s2                           - JVD- none , edema- none, stasis changes- none, varices- none           Lung- clear to P&A, wheeze- none,  cough-none, dullness-none, rub- none           Chest wall- + lipoma L shoulder Abd-  Br/ Gen/ Rectal- Not done, not indicated Extrem- cyanosis- none, clubbing, none, atrophy- none, strength- nl, TKR scars- healing well Neuro- grossly intact to observation

## 2021-01-08 ENCOUNTER — Encounter: Payer: Self-pay | Admitting: Internal Medicine

## 2021-01-08 ENCOUNTER — Telehealth: Payer: Self-pay | Admitting: Pharmacist

## 2021-01-08 ENCOUNTER — Other Ambulatory Visit: Payer: Self-pay

## 2021-01-08 ENCOUNTER — Ambulatory Visit (INDEPENDENT_AMBULATORY_CARE_PROVIDER_SITE_OTHER): Payer: Medicare Other | Admitting: Internal Medicine

## 2021-01-08 DIAGNOSIS — J45909 Unspecified asthma, uncomplicated: Secondary | ICD-10-CM | POA: Diagnosis not present

## 2021-01-08 DIAGNOSIS — J302 Other seasonal allergic rhinitis: Secondary | ICD-10-CM | POA: Diagnosis not present

## 2021-01-08 DIAGNOSIS — J3089 Other allergic rhinitis: Secondary | ICD-10-CM | POA: Diagnosis not present

## 2021-01-08 NOTE — Assessment & Plan Note (Signed)
Well controlled now. No changes appropriate

## 2021-01-08 NOTE — Chronic Care Management (AMB) (Signed)
    Chronic Care Management Pharmacy Assistant   Name: Teresa Murphy  MRN: [id number] DOB: [birth year]  01/08/21 APPOINTMENT REMINDER   Called Patient No answer, left message of appointment on 01/09/21 at 4 via telephone visit with Jeni Salles, Pharm D.   Notified to have all medications, supplements, blood pressure and/or blood sugar logs available during appointment and to return call if need to reschedule.  Care Gaps: AWV - 12/25/20 Hepatitis C Screening - Overdue TDAP - Overdue Zoster Vaccine - Overdue PNA Vaccine - Overdue COVID Booster #4 Therapist, music) - Overdue Flu Vaccine - Overdue CCM Call - 12-22  Star Rating Drug: Olmestartan (Benicar) 20 mg - Last filled 12/21/2020 90 DS at [facility]  Any gaps in medications fill history? None     Medications: Outpatient Encounter Medications as of 01/08/2021  Medication Sig   albuterol (PROAIR HFA) 108 (90 Base) MCG/ACT inhaler Inhale 2 puffs into the lungs every 4 (four) hours as needed.   cetirizine (ZYRTEC) 10 MG tablet Take 10 mg by mouth at bedtime.    Cinnamon 500 MG capsule Take 500 mg by mouth daily.   diazepam (VALIUM) 5 MG tablet TAKE 1 TABLET BY MOUTH EVERY DAY AS NEEDED FOR ANXIETY   DULoxetine (CYMBALTA) 60 MG capsule Take 1 capsule by mouth once daily   Fluticasone-Salmeterol (ADVAIR DISKUS) 250-50 MCG/DOSE AEPB INHALE 1 PUFF INTO LUNGS TWICE A DAY RINSE AFTER USE   Glucos-Chond-Hyal Ac-Ca Fructo (MOVE FREE JOINT HEALTH ADVANCE PO) Take 1 tablet by mouth daily.   latanoprost (XALATAN) 0.005 % ophthalmic solution Place 1 drop into both eyes at bedtime.   Magnesium 250 MG TABS Take 1 tablet by mouth daily.   montelukast (SINGULAIR) 10 MG tablet Take 1 tablet (10 mg total) by mouth at bedtime. TAKE 1 TABLET BY MOUTH EVERYDAY AT BEDTIME   Multiple Vitamins-Minerals (MULTIVITAMIN WOMEN 50+) TABS Take 1 tablet by mouth daily.   olmesartan (BENICAR) 20 MG tablet Take 1 tablet by mouth once daily   vitamin B-12  (CYANOCOBALAMIN) 500 MCG tablet Take 500 mcg by mouth daily.   Vitamin D, Ergocalciferol, (DRISDOL) 1.25 MG (50000 UNIT) CAPS capsule 1 cap every 10 days   No facility-administered encounter medications on file as of 01/08/2021.     [place] Clinical Pharmacist Assistant [phone]

## 2021-01-08 NOTE — Assessment & Plan Note (Signed)
Good control. Meds are appropriate  Plan- refills when ready

## 2021-01-08 NOTE — Patient Instructions (Signed)
We can continue current meds  I'm glad you are doing well. Please call if we can help

## 2021-01-09 ENCOUNTER — Telehealth: Payer: Medicare Other

## 2021-01-09 ENCOUNTER — Ambulatory Visit (INDEPENDENT_AMBULATORY_CARE_PROVIDER_SITE_OTHER): Payer: Medicare Other | Admitting: Pharmacist

## 2021-01-09 DIAGNOSIS — F411 Generalized anxiety disorder: Secondary | ICD-10-CM

## 2021-01-09 DIAGNOSIS — I10 Essential (primary) hypertension: Secondary | ICD-10-CM

## 2021-01-09 DIAGNOSIS — E785 Hyperlipidemia, unspecified: Secondary | ICD-10-CM

## 2021-01-09 NOTE — Patient Instructions (Addendum)
Hi Teresa Murphy,  It was great to get to catch up with you! I will let you know what Dr. Martinique says about the cholesterol medication.   Here is the [employer] phone number I mentioned: [phone].  Please reach out to me if you have any questions or need anything before our follow up!  Best, Teresa Murphy  Jeni Salles, PharmD, [facility] [place]   Visit Information   Goals Addressed   None    Patient Care Plan: CCM Pharmacy Care Plan     Problem Identified: Problem: Hypertension, Hyperlipidemia, Asthma, Depression, Anxiety, Osteoarthritis, Allergic Rhinitis and Vitamin D deficiency      Long-Range Goal: Patient-Specific Goal   Start Date: 08/02/2020  Expected End Date: 08/02/2021  Recent Progress: On track  Priority: High  Note:   Current Barriers:  Unable to independently monitor therapeutic efficacy Unable to achieve control of cholesterol  Unable to maintain control of blood pressure  Pharmacist Clinical Goal(s):  Patient will achieve adherence to monitoring guidelines and medication adherence to achieve therapeutic efficacy achieve control of cholesterol as evidenced by lipid panel maintain control of blood pressure as evidenced by home blood pressure readings  through collaboration with PharmD and provider.   Interventions: 1:1 collaboration with Martinique, [name], MD regarding development and update of comprehensive plan of care as evidenced by provider attestation and co-signature Inter-disciplinary care team collaboration (see longitudinal plan of care) Comprehensive medication review performed; medication list updated in electronic medical record  Hypertension (BP goal <140/90) -Controlled -Current treatment: Olmesartan 20 mg 1 tablet daily in PM -Medications previously tried: none -Current home readings: checking at home but does not have readings today as she misplaced them  -Current dietary  habits: limits salt intake  -Current exercise habits: limiting with joint pain -Reports hypotensive/hypertensive symptoms -Educated on Exercise goal of 150 minutes per week; Importance of home blood pressure monitoring; Proper BP monitoring technique; -Counseled to monitor BP at home at least weekly, document, and provide log at future appointments -Counseled on diet and exercise extensively Recommended to continue current medication  Hyperlipidemia: (LDL goal < 100) -Uncontrolled -Current treatment: No medications -Medications previously tried: none  -Current dietary patterns: eats oatmeal every day and fruits and vegetables; does not fry foods often; tries to limit cheese -Current exercise habits: limited with knee replacements -Educated on Cholesterol goals;  Benefits of statin for ASCVD risk reduction; Importance of limiting foods high in cholesterol; Exercise goal of 150 minutes per week; -Counseled on diet and exercise extensively Collaborated with PCP to start statin therapy  Asthma (Goal: control symptoms) -Controlled -Current treatment  Advair Diskus 250-50 mcg 1 puff as needed Albuterol HFA 2 puffs as needed Montelukast 10 mg 1 tablet at bedtime -Medications previously tried: none  -Exacerbations requiring treatment in last 6 months: none -Patient denies consistent use of maintenance inhaler -Frequency of rescue inhaler use: does not use -Counseled on When to use rescue inhaler -Recommended to continue current medication  Depression/Anxiety (Goal: minimize symptoms) -Controlled -Current treatment: Duloxetine 60 mg 1 capsule every evening Diazepam 5 mg 1 tablet daily as needed for anxiety -Medications previously tried/failed: n/a -PHQ9: 0 -GAD7: n/a -Educated on Benefits of medication for symptom control -Recommended to continue current medication Counseled on limiting use of diazepam  Allergic rhinitis (Goal: minimize symptoms) -Controlled -Current  treatment  Cetirizine 10 mg 1 tablet daily -Medications previously tried: none  -Recommended to continue current medication  Vitamin D deficiency (Goal: 30-100) -Controlled -Current  treatment  Vitamin D 50,000 units 1 capsule every 5 days -Medications previously tried: none  -Recommended to continue current medication Counseled on taking with food Recommended switching to every 10 days as instructed by Dr. Martinique  Osteoarthritis (Goal: minimize pain) -Controlled -Current treatment  Glucosamine-chondroitin 1 tablet daily Tylenol 650 mg 1 tablet twice daily -Medications previously tried: none  -Counseled on maximum dose of Tylenol is 3000 mg/day Recommended regular exercise to help with joint pain  Health Maintenance -Vaccine gaps: Shingrix, Pneumovax, COVID booster, tetanus -Current therapy:  Vitamin B12 500 mcg 1 tablet every other day Women's multivitamin 1 tablet daily Latanoprost 0.005% ophthalmic solution 1 drop in both eyes at bedtime Cinnamon 500 mg 1 capsule daily -Educated on Cost vs benefit of each product must be carefully weighed by individual consumer -Patient is satisfied with current therapy and denies issues -Recommended to continue current medication  Patient Goals/Self-Care Activities Patient will:  - take medications as prescribed check blood pressure at least weekly, document, and provide at future appointments target a minimum of 150 minutes of moderate intensity exercise weekly  Follow Up Plan: Telephone follow up appointment with care management team member scheduled for: 4 months       Patient verbalizes understanding of instructions provided today and agrees to view in [place].  The pharmacy team will reach out to the patient again over the next 30 days.   Viona Gilmore, [facility]

## 2021-01-09 NOTE — Progress Notes (Signed)
Chronic Care Management Pharmacy Note  01/10/2021 Name:  Teresa Murphy MRN:  [id number] DOB:  January 02, 1951  Summary: LDL not at goal < 100  Recommendations/Changes made from today's visit: -Recommended bringing BP cuff to office visit to ensure accuracy -Recommended starting moderate intensity statin therapy  -Provided Behavioral Health phone number  Plan: Follow up tolerance assessment in 3-4 weeks   Subjective: Teresa Murphy is an 70 y.o. year old female who is a primary patient of [place], Timoteo Expose, MD.  The CCM team was consulted for assistance with disease management and care coordination needs.    Engaged with patient by telephone for follow up visit in response to provider referral for pharmacy case management and/or care coordination services.   Consent to Services:  The patient was given information about Chronic Care Management services, agreed to services, and gave verbal consent prior to initiation of services.  Please see initial visit note for detailed documentation.   Patient Care Team: [place], [name], MD as PCP - General (Family Medicine) Verner Chol, [facility] as Pharmacist (Pharmacist)  Recent office visits: 12/25/20 Betty [place] MD (PCP) - seen for medicare annual wellness exam and other chronic conditions. Recommended Crestor 10 mg daily but left voicemail for patient. Follow up in 6 months.   Recent consult visits: 01/08/21 Jetty Duhamel, MD (pulmonary): Patient presented for asthma follow up. No med changes.  01/05/21 Durene Romans (ortho): Patient presented for follow up for right and left knee replacement. Unable to access notes.  03/10/20 Barbette Or (Physical Therapy) -  patient presented for physical therapy due to artificial knee joint of right knee with pain. No medication changes or follow up noted.   01/17/20 Norman Herrlich MD (Cardiology) -  presented to clinic for abnormal EKG. No medication changes. Follow up as needed.  Hospital  visits: Medication Reconciliation was completed by comparing discharge summary, patient's EMR and Pharmacy list, and upon discussion with patient.   Admitted to the hospital on 04/04/20 due to Osteoarthritis of left knee. Discharge date was 04/05/20. Discharged from [facility].  Total left knee replacement surgery.    New?Medications Started at [facility] Discharge:?? -started Aspirin    Medication Changes at Hospital Discharge: -Changed None.    Medications Discontinued at Hospital Discharge: -Stopped Acetaminophen.    Medications that remain the same after Hospital Discharge:?? -All other medications will remain the same.       Medication Reconciliation was completed by comparing discharge summary, patient's EMR and Pharmacy list, and upon discussion with patient.   Admitted to the hospital on 01/18/20 due to Osteoarthritis of right knee. Discharge date was 01/19/20 Discharged from [facility].  Total right knee replacement surgery.    New?Medications Started at [facility] Discharge:?? -started Aspirin, celecoxib, docusate sodium, ferrous sulfate, hydrocodone- acetaminophen, methocarbamol and polyethylene glycol.   Medication Changes at Hospital Discharge: -Changed None.    Medications Discontinued at Hospital Discharge: -Stopped Naproxen.    Medications that remain the same after Hospital Discharge:?? -All other medications will remain the same.    Objective:  Lab Results  Component Value Date   CREATININE 0.71 12/25/2020   BUN 12 12/25/2020   GFR 84.73 12/25/2020   GFRNONAA >60 04/05/2020   GFRAA 102 12/21/2019   NA 140 12/25/2020   K 3.6 12/25/2020   CALCIUM 9.3 12/25/2020   CO2 31 12/25/2020   GLUCOSE 84 12/25/2020    Lab Results  Component Value Date/Time   HGBA1C 5.5 12/25/2020 12:21  PM   HGBA1C 5.5 11/25/2017 03:09 PM   GFR 84.73 12/25/2020 12:21 PM   GFR 92.56 11/25/2017 03:09 PM    Last diabetic Eye exam: No results  found for: HMDIABEYEEXA  Last diabetic Foot exam: No results found for: HMDIABFOOTEX   Lab Results  Component Value Date   CHOL 189 12/25/2020   HDL 46.60 12/25/2020   LDLCALC 114 (H) 12/25/2020   TRIG 143.0 12/25/2020   CHOLHDL 4 12/25/2020    Hepatic Function Latest Ref Rng & Units 12/25/2020 12/21/2019 11/25/2017  Total Protein 6.0 - 8.3 g/dL 7.2 7.3 6.8  Albumin 3.5 - 5.2 g/dL 4.5 - 4.3  AST 0 - 37 U/L $Remo'21 22 17  'HRuHq$ ALT 0 - 35 U/L $Remo'15 16 18  'oXoro$ Alk Phosphatase 39 - 117 U/L 86 - 94  Total Bilirubin 0.2 - 1.2 mg/dL 0.6 0.6 0.4    No results found for: TSH, FREET4  CBC Latest Ref Rng & Units 12/25/2020 04/05/2020 03/28/2020  WBC 4.0 - 10.5 K/uL 5.5 11.2(H) 7.8  Hemoglobin 12.0 - 15.0 g/dL 14.2 10.1(L) 13.9  Hematocrit 36.0 - 46.0 % 41.5 31.1(L) 41.8  Platelets 150.0 - 400.0 K/uL 264.0 244 280    Lab Results  Component Value Date/Time   VD25OH 35.91 12/25/2020 12:21 PM   VD25OH 79 12/21/2019 11:30 AM   VD25OH 40.68 09/29/2017 03:08 PM    Clinical ASCVD: No  The 10-year ASCVD risk score (Arnett DK, et al., 2019) is: 12.1%   Values used to calculate the score:     Age: 57 years     Sex: Female     Is Non-Hispanic African American: No     Diabetic: No     Tobacco smoker: No     Systolic Blood Pressure: 909 mmHg     Is BP treated: Yes     HDL Cholesterol: 46.6 mg/dL     Total Cholesterol: 189 mg/dL    Depression screen [facility] 2/9 12/25/2020 12/21/2019 10/02/2017  Decreased Interest 0 0 0  Down, Depressed, Hopeless 0 0 0  PHQ - 2 Score 0 0 0     Social History   Tobacco Use  Smoking Status Never  Smokeless Tobacco Never   BP Readings from Last 3 Encounters:  01/08/21 112/90  12/25/20 130/88  04/05/20 (!) 107/51   Pulse Readings from Last 3 Encounters:  01/08/21 80  12/25/20 70  04/05/20 82   Wt Readings from Last 3 Encounters:  01/08/21 203 lb 9.6 oz (92.4 kg)  12/25/20 195 lb 12.8 oz (88.8 kg)  04/04/20 207 lb (93.9 kg)   BMI Readings from Last 3 Encounters:   01/08/21 36.07 kg/m  12/25/20 35.81 kg/m  04/04/20 37.86 kg/m    Assessment/Interventions: Review of patient past medical history, allergies, medications, health status, including review of consultants reports, laboratory and other test data, was performed as part of comprehensive evaluation and provision of chronic care management services.   SDOH:  (Social Determinants of Health) assessments and interventions performed: No   SDOH Screenings   Alcohol Screen: Not on file  Depression (PHQ2-9): Low Risk    PHQ-2 Score: 0  Financial Resource Strain: Low Risk    Difficulty of Paying Living Expenses: Not hard at all  Food Insecurity: Not on file  Housing: Not on file  Physical Activity: Not on file  Social Connections: Not on file  Stress: Not on file  Tobacco Use: Low Risk    Smoking Tobacco Use: Never   Smokeless Tobacco Use:  Never   Passive Exposure: Not on file  Transportation Needs: No Transportation Needs   Lack of Transportation (Medical): No   Lack of Transportation (Non-Medical): No   CCM Care Plan  No Known Allergies  Medications Reviewed Today     Reviewed by Viona Gilmore, [facility] (Pharmacist) on 01/09/21 at [place] List Status: <None>   Medication Order Taking? Sig Documenting Provider Last Dose Status Informant  albuterol (PROAIR HFA) 108 (90 Base) MCG/ACT inhaler [id number]  Inhale 2 puffs into the lungs every 4 (four) hours as needed. Martinique, [name], MD  Active   cetirizine (ZYRTEC) 10 MG tablet [id number]  Take 10 mg by mouth at bedtime.  [provider]  Active Self  Cinnamon 500 MG capsule [id number]  Take 500 mg by mouth daily. [provider]  Active Self  diazepam (VALIUM) 5 MG tablet [id number]  TAKE 1 TABLET BY MOUTH EVERY DAY AS NEEDED FOR ANXIETY Martinique, [name], MD  Active   DULoxetine (CYMBALTA) 60 MG capsule [id number]  Take 1 capsule by mouth once daily Martinique, [name], MD  Active   Fluticasone-Salmeterol (ADVAIR DISKUS) 250-50  MCG/DOSE AEPB [id number]  INHALE 1 PUFF INTO LUNGS TWICE A DAY RINSE AFTER USE Martinique, [name], MD  Active   Glucos-Chond-Hyal Ac-Ca Fructo (MOVE FREE JOINT HEALTH ADVANCE PO) [id number]  Take 1 tablet by mouth daily. [provider]  Active Self  latanoprost (XALATAN) 0.005 % ophthalmic solution [id number]  Place 1 drop into both eyes at bedtime. [provider]  Active Self           Med Note Leslie Dales, ANNA E   Wed Dec 10, 2017 11:21 AM)    Magnesium 250 MG TABS [id number]  Take 1 tablet by mouth daily. [provider]  Active   montelukast (SINGULAIR) 10 MG tablet [id number]  Take 1 tablet (10 mg total) by mouth at bedtime. TAKE 1 TABLET BY MOUTH EVERYDAY AT BEDTIME Baird Lyons D, MD  Active   Multiple Vitamins-Minerals (MULTIVITAMIN WOMEN 50+) TABS [id number]  Take 1 tablet by mouth daily. [provider]  Active Self  olmesartan (BENICAR) 20 MG tablet [id number]  Take 1 tablet by mouth once daily Martinique, [name], MD  Active   vitamin B-12 (CYANOCOBALAMIN) 500 MCG tablet [id number]  Take 500 mcg by mouth daily. [provider]  Active Self  Vitamin D, Ergocalciferol, (DRISDOL) 1.25 MG (50000 UNIT) CAPS capsule [id number]  1 cap every 10 days Martinique, [name], MD  Active             Patient Active Problem List   Diagnosis Date Noted   Osteoarthritis of left knee 04/04/2020   Status post total left knee replacement 04/04/2020   Obese 01/19/2020   Status post total right knee replacement 01/18/2020   Osteoarthritis of right knee 01/18/2020   Pneumonia    History of kidney stones    Depression    Arthritis    Anxiety    Hyperlipidemia 11/25/2017   CAP (community acquired pneumonia) 03/20/2016   Severe obesity (BMI 35.0-39.9) with comorbidity ([place]) 01/11/2016   Vitamin D deficiency, unspecified 07/10/2015   Anxiety disorder 07/10/2015   Osteoarthritis of both knees 07/10/2015   Essential hypertension 04/21/2010   Seasonal and perennial allergic  rhinitis 04/19/2010   Asthma with bronchitis 04/19/2010    Immunization History  Administered Date(s) Administered   Fluad Quad(high Dose 65+) 12/21/2019, 12/25/2020   Influenza Split 11/07/2010, 11/05/2011, 11/04/2012   Influenza, High  Dose Seasonal PF 01/11/2016, 12/05/2016, 02/07/2018, 12/21/2018   Influenza,inj,quad, With Preservative 11/04/2016   Influenza-Unspecified 12/05/2016   PFIZER(Purple Top)SARS-COV-2 Vaccination 03/29/2019, 04/19/2019, 11/20/2019   Pneumococcal Conjugate-13 02/04/2013   Pneumococcal Polysaccharide-23 04/30/2012   Patient reports her late husband's children are still bothering her since his death. Patient is spending most of her time at her mom's house but she has to go to her other house once a day. Patient's sister is helping her with her moving process and she is moving in with her as soon as possible.  Patient is trying to stear clear of her husband's children right now with all of the stress they are giving her.  Conditions to be addressed/monitored:  Hypertension, Hyperlipidemia, Asthma, Depression, Anxiety, Osteoarthritis, Allergic Rhinitis, and Vitamin D deficiency  Conditions to be addressed this visit: Hypertension, hyperlipidemia, depression, anxiety  Care Plan : CCM Pharmacy Care Plan  Updates made by Viona Gilmore, [place] since 01/10/2021 12:00 AM     Problem: Problem: Hypertension, Hyperlipidemia, Asthma, Depression, Anxiety, Osteoarthritis, Allergic Rhinitis and Vitamin D deficiency      Long-Range Goal: Patient-Specific Goal   Start Date: 08/02/2020  Expected End Date: 08/02/2021  Recent Progress: On track  Priority: High  Note:   Current Barriers:  Unable to independently monitor therapeutic efficacy Unable to achieve control of cholesterol  Unable to maintain control of blood pressure  Pharmacist Clinical Goal(s):  Patient will achieve adherence to monitoring guidelines and medication adherence to achieve therapeutic  efficacy achieve control of cholesterol as evidenced by lipid panel maintain control of blood pressure as evidenced by home blood pressure readings  through collaboration with PharmD and provider.   Interventions: 1:1 collaboration with Martinique, [name], MD regarding development and update of comprehensive plan of care as evidenced by provider attestation and co-signature Inter-disciplinary care team collaboration (see longitudinal plan of care) Comprehensive medication review performed; medication list updated in electronic medical record  Hypertension (BP goal <140/90) -Controlled -Current treatment: Olmesartan 20 mg 1 tablet daily in PM -Medications previously tried: none -Current home readings: checking at home but does not have readings today as she misplaced them (highest was 140/78) -Current dietary habits: limits salt intake  -Current exercise habits: limiting with joint pain -Reports hypotensive/hypertensive symptoms -Educated on Exercise goal of 150 minutes per week; Importance of home blood pressure monitoring; Proper BP monitoring technique; -Counseled to monitor BP at home at least weekly, document, and provide log at future appointments -Counseled on diet and exercise extensively Recommended to continue current medication  Hyperlipidemia: (LDL goal < 100) -Uncontrolled -Current treatment: No medications -Medications previously tried: none  -Current dietary patterns: eats oatmeal every day and fruits and vegetables; does not fry foods often; tries to limit cheese -Current exercise habits: limited with knee replacements -Educated on Cholesterol goals;  Benefits of statin for ASCVD risk reduction; Importance of limiting foods high in cholesterol; Exercise goal of 150 minutes per week; -Counseled on diet and exercise extensively Collaborated with PCP to start statin therapy  Asthma (Goal: control symptoms) -Controlled -Current treatment  Advair Diskus 250-50 mcg 1  puff as needed Albuterol HFA 2 puffs as needed Montelukast 10 mg 1 tablet at bedtime -Medications previously tried: none  -Exacerbations requiring treatment in last 6 months: none -Patient denies consistent use of maintenance inhaler -Frequency of rescue inhaler use: does not use -Counseled on When to use rescue inhaler -Recommended to continue current medication  Depression/Anxiety (Goal: minimize symptoms) -Controlled -Current treatment: Duloxetine 60 mg 1 capsule every evening  Diazepam 5 mg 1 tablet daily as needed for anxiety -Medications previously tried/failed: n/a -PHQ9: 0 -GAD7: n/a -Educated on Benefits of medication for symptom control -Recommended to continue current medication Counseled on limiting use of diazepam  Allergic rhinitis (Goal: minimize symptoms) -Controlled -Current treatment  Cetirizine 10 mg 1 tablet daily -Medications previously tried: none  -Recommended to continue current medication  Vitamin D deficiency (Goal: 30-100) -Controlled -Current treatment  Vitamin D 50,000 units 1 capsule every 5 days -Medications previously tried: none  -Recommended to continue current medication Counseled on taking with food Recommended switching to every 10 days as instructed by Dr. Martinique  Osteoarthritis (Goal: minimize pain) -Controlled -Current treatment  Glucosamine-chondroitin 1 tablet daily Tylenol 650 mg 1 tablet twice daily -Medications previously tried: none  -Counseled on maximum dose of Tylenol is 3000 mg/day Recommended regular exercise to help with joint pain  Health Maintenance -Vaccine gaps: Shingrix, Pneumovax, COVID booster, tetanus -Current therapy:  Vitamin B12 500 mcg 1 tablet every other day Women's multivitamin 1 tablet daily Latanoprost 0.005% ophthalmic solution 1 drop in both eyes at bedtime Cinnamon 500 mg 1 capsule daily -Educated on Cost vs benefit of each product must be carefully weighed by individual consumer -Patient is  satisfied with current therapy and denies issues -Recommended to continue current medication  Patient Goals/Self-Care Activities Patient will:  - take medications as prescribed check blood pressure at least weekly, document, and provide at future appointments target a minimum of 150 minutes of moderate intensity exercise weekly  Follow Up Plan: Telephone follow up appointment with care management team member scheduled for: 4 months        Medication Assistance: None required.  Patient affirms current coverage meets needs.  Compliance/Adherence/Medication fill history: Care Gaps: Shingrix, Prevnar 20, COVID booster, tetanus, Hep C screening, influenza vaccine Last BP: 112/90   Star-Rating Drugs: Olmestartan (Benicar) 20 mg - Last filled 12/21/2020 90 DS at [facility]  Patient's preferred pharmacy is:  [id number] - [place], [place] [address] Phone: [phone] Fax: [phone]  [place], [place] - [address] [place] [address] Phone: [phone] Fax: [phone]  [id number] - [place], [address] [place] [address] Phone: [phone] Fax: [phone]  Uses pill box? Yes  Pt endorses 99% compliance - very rarely  We discussed: Current pharmacy is preferred with insurance plan and patient is satisfied with pharmacy services Patient decided to: Continue current medication management strategy  Care Plan and Follow Up Patient Decision:  Patient agrees to Care Plan and Follow-up.  Plan: The care management team will reach out to the patient again over the next 30 days.  Jeni Salles, PharmD, [place] Pharmacist [place] [facility] [place]

## 2021-01-10 ENCOUNTER — Telehealth: Payer: Self-pay

## 2021-01-10 DIAGNOSIS — H26492 Other secondary cataract, left eye: Secondary | ICD-10-CM | POA: Diagnosis not present

## 2021-01-10 MED ORDER — ROSUVASTATIN CALCIUM 10 MG PO TABS: 10.0000 mg | ORAL_TABLET | Freq: Every day | ORAL | 3 refills | Status: DC

## 2021-01-10 NOTE — Telephone Encounter (Signed)
Left detailed voicemail for patient to start taking the rosuvastatin that was sent into the pharmacy. Recommended for patient to call back with any questions.

## 2021-01-10 NOTE — Telephone Encounter (Signed)
-----   Message from Viona Gilmore, [facility] sent at 01/10/2021  8:36 AM EST ----- Regarding: Rosuvastatin Hi,  I spoke with Juliann Pulse yesterday and mentioned your voicemail to her about starting the rosuvastatin 10 mg and she said she forgot to call you back but is interested in trying it. Can you possibly send this in to the [facility] on [address]?  I will call her to let her know!  Thanks, Maddie

## 2021-01-17 DIAGNOSIS — H26491 Other secondary cataract, right eye: Secondary | ICD-10-CM | POA: Diagnosis not present

## 2021-01-30 ENCOUNTER — Telehealth: Payer: Self-pay | Admitting: Pharmacist

## 2021-01-30 NOTE — Chronic Care Management (AMB) (Signed)
° ° °  Chronic Care Management Pharmacy Assistant   Name: Teresa Murphy  MRN: [id number] DOB: 19-Dec-1950  Reason for Encounter: Rosuvastatin Tolerance Per Jeni Salles   Recent office visits:  None  Recent consult visits:  None  Hospital visits:  None in previous 6 months  Medications: Outpatient Encounter Medications as of 01/30/2021  Medication Sig   albuterol (PROAIR HFA) 108 (90 Base) MCG/ACT inhaler Inhale 2 puffs into the lungs every 4 (four) hours as needed.   carboxymethylcellulose (REFRESH PLUS) 0.5 % SOLN 1 drop 3 (three) times daily as needed.   cetirizine (ZYRTEC) 10 MG tablet Take 10 mg by mouth at bedtime.    Cinnamon 500 MG capsule Take 500 mg by mouth daily.   diazepam (VALIUM) 5 MG tablet TAKE 1 TABLET BY MOUTH EVERY DAY AS NEEDED FOR ANXIETY   DULoxetine (CYMBALTA) 60 MG capsule Take 1 capsule by mouth once daily   Fluticasone-Salmeterol (ADVAIR DISKUS) 250-50 MCG/DOSE AEPB INHALE 1 PUFF INTO LUNGS TWICE A DAY RINSE AFTER USE   Glucos-Chond-Hyal Ac-Ca Fructo (MOVE FREE JOINT HEALTH ADVANCE PO) Take 1 tablet by mouth daily.   latanoprost (XALATAN) 0.005 % ophthalmic solution Place 1 drop into both eyes at bedtime.   Magnesium 250 MG TABS Take 1 tablet by mouth daily.   montelukast (SINGULAIR) 10 MG tablet Take 1 tablet (10 mg total) by mouth at bedtime. TAKE 1 TABLET BY MOUTH EVERYDAY AT BEDTIME   Multiple Vitamins-Minerals (MULTIVITAMIN WOMEN 50+) TABS Take 1 tablet by mouth daily.   olmesartan (BENICAR) 20 MG tablet Take 1 tablet by mouth once daily   rosuvastatin (CRESTOR) 10 MG tablet Take 1 tablet (10 mg total) by mouth daily.   vitamin B-12 (CYANOCOBALAMIN) 500 MCG tablet Take 500 mcg by mouth daily.   Vitamin D, Ergocalciferol, (DRISDOL) 1.25 MG (50000 UNIT) CAPS capsule 1 cap every 10 days   No facility-administered encounter medications on file as of 01/30/2021.  Notes: Call to patient per Clinical Pharmacist to see how patient is tolerating  starting  the medication Rosuvastatin 10 mg.   Attempted to reach on 12/27 left vm Attempted to reach on 12/28 Unable to reach 12/29  Care Gaps: AWV - 12/25/20 Hepatitis C Screening - Overdue PNA Vaccine - Overdue COVID Booster #4 [employer]) - [place] CCM Call - NEED 6/23  Star Rating Drugs: Olmestartan (Benicar) 20 mg - Last filled 12/21/2020 90 DS at [facility] Rosuvastatin (Crestor) 10 mg - Last filled 01/10/21 30 DS at [facility]  Patient Assistance: None  [facility] Clinical Pharmacist Assistant [phone]

## 2021-02-03 DIAGNOSIS — J45909 Unspecified asthma, uncomplicated: Secondary | ICD-10-CM | POA: Diagnosis not present

## 2021-02-03 DIAGNOSIS — F32A Depression, unspecified: Secondary | ICD-10-CM | POA: Diagnosis not present

## 2021-02-03 DIAGNOSIS — E785 Hyperlipidemia, unspecified: Secondary | ICD-10-CM | POA: Diagnosis not present

## 2021-02-03 DIAGNOSIS — I10 Essential (primary) hypertension: Secondary | ICD-10-CM

## 2021-02-26 ENCOUNTER — Telehealth: Payer: Self-pay | Admitting: Pharmacist

## 2021-02-26 NOTE — Chronic Care Management (AMB) (Signed)
Chronic Care Management Pharmacy Assistant   Name: Teresa Murphy  MRN: [id number] DOB: November 29, 1950   Reason for Encounter: Rosuvastatin Tolerance Per Jeni Salles   Conditions to be addressed/monitored: HLD  Recent office visits:  None  Recent consult visits:  None  Hospital visits:  None in previous 6 months  Medications: Outpatient Encounter Medications as of 02/26/2021  Medication Sig   albuterol (PROAIR HFA) 108 (90 Base) MCG/ACT inhaler Inhale 2 puffs into the lungs every 4 (four) hours as needed.   carboxymethylcellulose (REFRESH PLUS) 0.5 % SOLN 1 drop 3 (three) times daily as needed.   cetirizine (ZYRTEC) 10 MG tablet Take 10 mg by mouth at bedtime.    Cinnamon 500 MG capsule Take 500 mg by mouth daily.   diazepam (VALIUM) 5 MG tablet TAKE 1 TABLET BY MOUTH EVERY DAY AS NEEDED FOR ANXIETY   DULoxetine (CYMBALTA) 60 MG capsule Take 1 capsule by mouth once daily   Fluticasone-Salmeterol (ADVAIR DISKUS) 250-50 MCG/DOSE AEPB INHALE 1 PUFF INTO LUNGS TWICE A DAY RINSE AFTER USE   Glucos-Chond-Hyal Ac-Ca Fructo (MOVE FREE JOINT HEALTH ADVANCE PO) Take 1 tablet by mouth daily.   latanoprost (XALATAN) 0.005 % ophthalmic solution Place 1 drop into both eyes at bedtime.   Magnesium 250 MG TABS Take 1 tablet by mouth daily.   montelukast (SINGULAIR) 10 MG tablet Take 1 tablet (10 mg total) by mouth at bedtime. TAKE 1 TABLET BY MOUTH EVERYDAY AT BEDTIME   Multiple Vitamins-Minerals (MULTIVITAMIN WOMEN 50+) TABS Take 1 tablet by mouth daily.   olmesartan (BENICAR) 20 MG tablet Take 1 tablet by mouth once daily   rosuvastatin (CRESTOR) 10 MG tablet Take 1 tablet (10 mg total) by mouth daily.   vitamin B-12 (CYANOCOBALAMIN) 500 MCG tablet Take 500 mcg by mouth daily.   Vitamin D, Ergocalciferol, (DRISDOL) 1.25 MG (50000 UNIT) CAPS capsule 1 cap every 10 days   No facility-administered encounter medications on file as of 02/26/2021.  02/26/2021 Name: Teresa Murphy MRN:  [id number] DOB: February 20, 1950 Teresa Murphy is a 71 y.o. year old female who is a primary care patient of Martin[place], Malka So, MD.  Comprehensive medication review performed; Spoke to patient regarding cholesterol  Lipid Panel    Component Value Date/Time   CHOL 189 12/25/2020 1221   TRIG 143.0 12/25/2020 1221   HDL 46.60 12/25/2020 1221   LDLCALC 114 (H) 12/25/2020 1221   Monserrate 142 (H) 12/21/2019 1130    10-year ASCVD risk score: The 10-year ASCVD risk score (Arnett DK, et al., 2019) is: 12.1%   Values used to calculate the score:     Age: 71 years     Sex: Female     Is Non-Hispanic African American: No     Diabetic: No     Tobacco smoker: No     Systolic Blood Pressure: 867 mmHg     Is BP treated: Yes     HDL Cholesterol: 46.6 mg/dL     Total Cholesterol: 189 mg/dL  Current antihyperlipidemic regimen:  Rosuvastatin 10 mg  Previous antihyperlipidemic medications tried: none ASCVD risk enhancing conditions: HTN What recent interventions/DTPs have been made by any provider to improve Cholesterol control since last CPP Visit: Patient was recently prescribed the above medication Any recent hospitalizations or ED visits since last visit with CPP? No Notes: Call to patient per Clinical Pharmacist to see how patient is tolerating  starting the medication Rosuvastatin 10 mg. She reports she did pick up the medication  and started off taking 2 times a week and is now doing every other day. She reports she feels that her knees feel tight occassionally but no cramping or other side effects from the medication/   Adherence Review: Does the patient have >5 day gap between last estimated fill dates? No     Care Gaps: AWV - 12/25/20 Hepatitis C Screening - Overdue PNA Vaccine - Overdue COVID Booster #4 [employer]) - Overdue [place] CCM Call -6/23  Star Rating Drugs: Olmestartan (Benicar) 20 mg - Last filled 12/21/2020 90 DS at [facility] Rosuvastatin (Crestor) 10 mg - Last filled  02/11/20 30 DS at [facility]  Patient Assistance: None  [facility] Clinical Pharmacist Assistant [phone]

## 2021-04-26 ENCOUNTER — Other Ambulatory Visit: Payer: Self-pay | Admitting: Family Medicine

## 2021-04-26 ENCOUNTER — Other Ambulatory Visit: Payer: Self-pay | Admitting: Internal Medicine

## 2021-06-01 ENCOUNTER — Telehealth: Payer: Self-pay | Admitting: Family Medicine

## 2021-06-01 ENCOUNTER — Other Ambulatory Visit: Payer: Self-pay | Admitting: Family Medicine

## 2021-06-01 DIAGNOSIS — Z1231 Encounter for screening mammogram for malignant neoplasm of breast: Secondary | ICD-10-CM

## 2021-06-01 DIAGNOSIS — E2839 Other primary ovarian failure: Secondary | ICD-10-CM

## 2021-06-01 NOTE — Telephone Encounter (Signed)
Order has been placed.

## 2021-06-01 NOTE — Telephone Encounter (Signed)
Pt requesting a bone density scan done prior to her appointment scheduled for 06/25/21. She is having a mammogram on 06/07/21 and would like to do them at the same time.  ?

## 2021-06-07 ENCOUNTER — Ambulatory Visit: Payer: Medicare Other

## 2021-06-07 ENCOUNTER — Ambulatory Visit
Admission: RE | Admit: 2021-06-07 | Discharge: 2021-06-07 | Disposition: A | Discharge status: Home / Self Care | Payer: Medicare Other | Source: Ambulatory Visit | Attending: Family Medicine | Admitting: Family Medicine

## 2021-06-07 DIAGNOSIS — Z1231 Encounter for screening mammogram for malignant neoplasm of breast: Secondary | ICD-10-CM

## 2021-06-12 ENCOUNTER — Other Ambulatory Visit: Payer: Self-pay | Admitting: Family Medicine

## 2021-06-20 ENCOUNTER — Other Ambulatory Visit: Payer: Self-pay | Admitting: Internal Medicine

## 2021-06-21 NOTE — Telephone Encounter (Signed)
Singulair refilled.

## 2021-06-22 NOTE — Progress Notes (Deleted)
HPI: [name] is a 71 y.o. female, who is here today for follow up. She was last seen on 12/25/20. No new health problems.  Hypertension:  Medications:Benicar 20 mg daily. BP readings at home:120-130/70's. Side effects:None Negative for unusual or severe headache, visual changes, exertional chest pain, dyspnea,  focal weakness, or edema.  Lab Results  Component Value Date   CREATININE 0.71 12/25/2020   BUN 12 12/25/2020   NA 140 12/25/2020   K 3.6 12/25/2020   CL 101 12/25/2020   CO2 31 12/25/2020   She is walking 3-4 times per week. She is doing better in regard to knee pain after knee surgery.  Anxiety: Since her husband death stress level has been high.Dealing with his children. *** She is on Duloxetine 60 mg daily and Valium 5 mg daily prn.  Review of Systems  Constitutional:  Negative for activity change, appetite change and fever.  HENT:  Negative for mouth sores, nosebleeds and sore throat.   Respiratory:  Negative for cough and wheezing.   Gastrointestinal:  Negative for abdominal pain, nausea and vomiting.       Negative for changes in bowel habits.  Genitourinary:  Negative for decreased urine volume and hematuria.  Allergic/Immunologic: Positive for environmental allergies.  Neurological:  Negative for syncope and facial asymmetry.  Psychiatric/Behavioral:  Negative for confusion. The patient is nervous/anxious.   Rest see pertinent positives and negatives per HPI.  Current Outpatient Medications on File Prior to Visit  Medication Sig Dispense Refill   albuterol (PROAIR HFA) 108 (90 Base) MCG/ACT inhaler Inhale 2 puffs into the lungs every 4 (four) hours as needed. 1 Inhaler 3   carboxymethylcellulose (REFRESH PLUS) 0.5 % SOLN 1 drop 3 (three) times daily as needed.     cetirizine (ZYRTEC) 10 MG tablet Take 10 mg by mouth at bedtime.      Cinnamon 500 MG capsule Take 500 mg by mouth daily.     DULoxetine (CYMBALTA) 60 MG capsule Take 1 capsule by  mouth once daily 90 capsule 2   Fluticasone-Salmeterol (ADVAIR DISKUS) 250-50 MCG/DOSE AEPB INHALE 1 PUFF INTO LUNGS TWICE A DAY RINSE AFTER USE 3 each 0   Glucos-Chond-Hyal Ac-Ca Fructo (MOVE FREE JOINT HEALTH ADVANCE PO) Take 1 tablet by mouth daily.     latanoprost (XALATAN) 0.005 % ophthalmic solution Place 1 drop into both eyes at bedtime.  11   Magnesium 250 MG TABS Take 1 tablet by mouth daily.     montelukast (SINGULAIR) 10 MG tablet TAKE 1 TABLET BY MOUTH ONCE DAILY AT BEDTIME 30 tablet 12   Multiple Vitamins-Minerals (MULTIVITAMIN WOMEN 50+) TABS Take 1 tablet by mouth daily.     olmesartan (BENICAR) 20 MG tablet Take 1 tablet by mouth once daily 90 tablet 2   rosuvastatin (CRESTOR) 10 MG tablet Take 1 tablet by mouth once daily 90 tablet 2   vitamin B-12 (CYANOCOBALAMIN) 500 MCG tablet Take 500 mcg by mouth daily.     Vitamin D, Ergocalciferol, (DRISDOL) 1.25 MG (50000 UNIT) CAPS capsule 1 cap every 10 days 9 capsule 2   No current facility-administered medications on file prior to visit.   Past Medical History:  Diagnosis Date   Allergic rhinitis    Anxiety    Anxiety disorder 07/10/2015   Arthritis    Asthma    Asthma with bronchitis 04/19/2010   PFT 09/18/2010-FEV1  1.95/92; FEV1/FVC 0.76; FEF 25-75% improved 48% with bronchodilator; normal lung volumes; DLCO 78%  CAP (community acquired pneumonia) 03/20/2016   Depression    Essential hypertension 04/21/2010   Qualifier: Diagnosis of  By: Annamaria Boots MD, Clinton D    History of kidney stones    Hyperlipidemia 11/25/2017   Hypertension    Osteoarthritis of both knees 07/10/2015   Pneumonia    hx of    Seasonal and perennial allergic rhinitis 04/19/2010   Allergy Profile 04/19/2010-negative, IgE  3.8 Allergy skin test 11/07/2010-positive for grass, weed, tree pollens, dust mite    Severe obesity (BMI 35.0-39.9) with comorbidity ([place]) 01/11/2016   Vitamin D deficiency 07/10/2015   No Known Allergies  Social History    Socioeconomic History   Marital status: Married    Spouse name: [name]   Number of children: 0   Years of education: College   Highest education level: Not on file  Occupational History   Occupation: retired  Tobacco Use   Smoking status: Never   Smokeless tobacco: Never  Vaping Use   Vaping Use: Never used  Substance and Sexual Activity   Alcohol use: No    Alcohol/week: 0.0 standard drinks   Drug use: No   Sexual activity: Yes    Birth control/protection: Surgical  Other Topics Concern   Not on file  Social History Narrative   Patient lives at home with spouse.   Caffeine Use:    Social Determinants of Health   Financial Resource Strain: Low Risk    Difficulty of Paying Living Expenses: Not hard at all  Food Insecurity: Not on file  Transportation Needs: No Transportation Needs   Lack of Transportation (Medical): No   Lack of Transportation (Non-Medical): No  Physical Activity: Not on file  Stress: Not on file  Social Connections: Not on file   Vitals:   06/25/21 0941  BP: 130/88  Pulse: 77  Resp: 16  Temp: 98.3 F (36.8 C)  SpO2: 98%   Wt Readings from Last 3 Encounters:  01/08/21 203 lb 9.6 oz (92.4 kg)  12/25/20 195 lb 12.8 oz (88.8 kg)  04/04/20 207 lb (93.9 kg)   Body mass index is 36.07 kg/m.  Physical Exam Vitals and nursing note reviewed.  Constitutional:      General: She is not in acute distress.    Appearance: She is well-developed.  HENT:     Head: Normocephalic and atraumatic.     Mouth/Throat:     Mouth: Mucous membranes are moist.     Pharynx: Oropharynx is clear.  Eyes:     Conjunctiva/sclera: Conjunctivae normal.  Cardiovascular:     Rate and Rhythm: Normal rate and regular rhythm.     Pulses:          Dorsalis pedis pulses are 2+ on the right side and 2+ on the left side.     Heart sounds: No murmur heard. Pulmonary:     Effort: Pulmonary effort is normal. No respiratory distress.     Breath sounds: Normal breath  sounds.  Abdominal:     Palpations: Abdomen is soft. There is no hepatomegaly or mass.     Tenderness: There is no abdominal tenderness.  Lymphadenopathy:     Cervical: No cervical adenopathy.  Skin:    General: Skin is warm.     Findings: No erythema or rash.  Neurological:     General: No focal deficit present.     Mental Status: She is alert and oriented to person, place, and time.     Cranial Nerves: No cranial nerve deficit.  Gait: Gait normal.  Psychiatric:        Mood and Affect: Affect is labile.     Comments: Well groomed, good eye contact.   ASSESSMENT AND PLAN:  Ms.Daun was seen today for follow-up.  Diagnoses and all orders for this visit:  Essential hypertension -     Basic metabolic panel; Future  Generalized anxiety disorder -     diazepam (VALIUM) 5 MG tablet; TAKE 1 TABLET BY MOUTH EVERY DAY AS NEEDED FOR ANXIETY  Encounter for HCV screening test for low risk patient -     Hepatitis C antibody; Future  Osteoarthritis of both knees, unspecified osteoarthritis type  Severe obesity (BMI 35.0-39.9) with comorbidity ([place])   Osteoarthritis of both knees Improved after bilateral TKR. Continue Aleve 220 mg bid prn. Wt loss may help. Some side effects of NSAID's.  Essential hypertension BP adequately controlled. Continue current management: Benicar 20 mg daily. DASH/low salt diet to continue. Continue monitoring BP at home. Eye exam recommended annually.  Anxiety disorder Stable otherwise. Continue Duloxetine 60 mg daily and Valium 5 mg daily prn.  Severe obesity (BMI 35.0-39.9) with comorbidity ([place]) She understands the benefits of wt loss as well as adverse effects of obesity. Consistency with healthy diet and physical activity recommended.  Return in about 6 months (around 12/26/2021) for CPE.  Yuliana Vandrunen G. Martinique, MD  [facility]. [place] office.

## 2021-06-25 ENCOUNTER — Encounter: Payer: Self-pay | Admitting: Family Medicine

## 2021-06-25 ENCOUNTER — Ambulatory Visit (INDEPENDENT_AMBULATORY_CARE_PROVIDER_SITE_OTHER): Payer: Medicare Other | Admitting: Family Medicine

## 2021-06-25 VITALS — BP 130/88 | HR 77 | Temp 98.3°F | Resp 16 | Ht 63.0 in

## 2021-06-25 DIAGNOSIS — E785 Hyperlipidemia, unspecified: Secondary | ICD-10-CM

## 2021-06-25 DIAGNOSIS — M170 Bilateral primary osteoarthritis of knee: Secondary | ICD-10-CM

## 2021-06-25 DIAGNOSIS — F411 Generalized anxiety disorder: Secondary | ICD-10-CM | POA: Diagnosis not present

## 2021-06-25 DIAGNOSIS — Z1159 Encounter for screening for other viral diseases: Secondary | ICD-10-CM

## 2021-06-25 DIAGNOSIS — Z23 Encounter for immunization: Secondary | ICD-10-CM

## 2021-06-25 DIAGNOSIS — I10 Essential (primary) hypertension: Secondary | ICD-10-CM | POA: Diagnosis not present

## 2021-06-25 LAB — BASIC METABOLIC PANEL
BUN: 20 mg/dL (ref 6–23)
CO2: 33 mEq/L — ABNORMAL HIGH (ref 19–32)
Calcium: 9.6 mg/dL (ref 8.4–10.5)
Chloride: 104 mEq/L (ref 96–112)
Creatinine, Ser: 0.75 mg/dL (ref 0.40–1.20)
GFR: 79.06 mL/min (ref >60.00)
Glucose, Bld: 96 mg/dL (ref 70–99)
Potassium: 4.6 mEq/L (ref 3.5–5.1)
Sodium: 143 mEq/L (ref 135–145)

## 2021-06-25 MED ORDER — DIAZEPAM 5 MG PO TABS: ORAL_TABLET | ORAL | 2 refills | Status: AC

## 2021-06-25 NOTE — Assessment & Plan Note (Signed)
She understands the benefits of wt loss as well as adverse effects of obesity. Consistency with healthy diet and physical activity recommended.

## 2021-06-25 NOTE — Assessment & Plan Note (Signed)
Improved after bilateral TKR. Continue Aleve 220 mg bid prn. Wt loss may help. Some side effects of NSAID's.

## 2021-06-25 NOTE — Assessment & Plan Note (Signed)
BP adequately controlled. Continue current management: Benicar 20 mg daily. DASH/low salt diet to continue. Continue monitoring BP at home. Eye exam recommended annually.

## 2021-06-25 NOTE — Patient Instructions (Addendum)
A few things to remember from today's visit:  Essential hypertension - Plan: Basic metabolic panel  Generalized anxiety disorder - Plan: diazepam (VALIUM) 5 MG tablet  Encounter for HCV screening test for low risk patient - Plan: Hepatitis C antibody  Osteoarthritis of both knees, unspecified osteoarthritis type  If you need refills please call your pharmacy. Do not use My Chart to request refills or for acute issues that need immediate attention.   Please be sure medication list is accurate. If a new problem present, please set up appointment sooner than planned today.

## 2021-06-25 NOTE — Assessment & Plan Note (Signed)
Stable otherwise. Continue Duloxetine 60 mg daily and Valium 5 mg daily prn.

## 2021-06-26 LAB — HEPATITIS C ANTIBODY
Hepatitis C Ab: NONREACTIVE
SIGNAL TO CUT-OFF: 0.06 (ref <1.00)
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
## 2021-06-28 NOTE — Progress Notes (Incomplete)
HPI: [name] is a 71 y.o. female, who is here today for follow up. She was last seen on 12/25/20. No new health problems.  Hypertension:  Medications:Benicar 20 mg daily. BP readings at home:120-130/70's. Side effects:None Negative for unusual or severe headache, visual changes, exertional chest pain, dyspnea,  focal weakness, or edema.  Lab Results  Component Value Date   CREATININE 0.71 12/25/2020   BUN 12 12/25/2020   NA 140 12/25/2020   K 3.6 12/25/2020   CL 101 12/25/2020   CO2 31 12/25/2020   She is walking 3-4 times per week. She is doing better in regard to knee pain after knee surgery.Still has intermittent knee pain. She takes Aleve 220 mg as needed. She is trying to eat healthier but has not been consistent.  Anxiety: Since her husband death stress level has been high, his children have made her life very difficult. After 25+ years of marriage, she was not in his will, everything left to 2 of his 3 children. Will instructions was to allow her to stay in the house for 2 years but hid deceased husband children keep getting ito the house without her authorization,moving things,taking things,and bring people (potential buyers) to see the house. She has a good support from her sister.  She is on Duloxetine 60 mg daily and Valium 5 mg daily prn.  Review of Systems  Constitutional:  Negative for activity change, appetite change and fever.  HENT:  Negative for mouth sores, nosebleeds and sore throat.   Respiratory:  Negative for cough and wheezing.   Gastrointestinal:  Negative for abdominal pain, nausea and vomiting.       Negative for changes in bowel habits.  Genitourinary:  Negative for decreased urine volume and hematuria.  Allergic/Immunologic: Positive for environmental allergies.  Neurological:  Negative for syncope and facial asymmetry.  Psychiatric/Behavioral:  Negative for confusion. The patient is nervous/anxious.   Rest see pertinent positives  and negatives per HPI.  Current Outpatient Medications on File Prior to Visit  Medication Sig Dispense Refill  . albuterol (PROAIR HFA) 108 (90 Base) MCG/ACT inhaler Inhale 2 puffs into the lungs every 4 (four) hours as needed. 1 Inhaler 3  . carboxymethylcellulose (REFRESH PLUS) 0.5 % SOLN 1 drop 3 (three) times daily as needed.    . cetirizine (ZYRTEC) 10 MG tablet Take 10 mg by mouth at bedtime.     . Cinnamon 500 MG capsule Take 500 mg by mouth daily.    . DULoxetine (CYMBALTA) 60 MG capsule Take 1 capsule by mouth once daily 90 capsule 2  . Fluticasone-Salmeterol (ADVAIR DISKUS) 250-50 MCG/DOSE AEPB INHALE 1 PUFF INTO LUNGS TWICE A DAY RINSE AFTER USE 3 each 0  . Glucos-Chond-Hyal Ac-Ca Fructo (MOVE FREE JOINT HEALTH ADVANCE PO) Take 1 tablet by mouth daily.    Marland Kitchen latanoprost (XALATAN) 0.005 % ophthalmic solution Place 1 drop into both eyes at bedtime.  11  . Magnesium 250 MG TABS Take 1 tablet by mouth daily.    . montelukast (SINGULAIR) 10 MG tablet TAKE 1 TABLET BY MOUTH ONCE DAILY AT BEDTIME 30 tablet 12  . Multiple Vitamins-Minerals (MULTIVITAMIN WOMEN 50+) TABS Take 1 tablet by mouth daily.    Marland Kitchen olmesartan (BENICAR) 20 MG tablet Take 1 tablet by mouth once daily 90 tablet 2  . rosuvastatin (CRESTOR) 10 MG tablet Take 1 tablet by mouth once daily 90 tablet 2  . vitamin B-12 (CYANOCOBALAMIN) 500 MCG tablet Take 500 mcg by mouth daily.    Marland Kitchen  Vitamin D, Ergocalciferol, (DRISDOL) 1.25 MG (50000 UNIT) CAPS capsule 1 cap every 10 days 9 capsule 2   No current facility-administered medications on file prior to visit.   Past Medical History:  Diagnosis Date  . Allergic rhinitis   . Anxiety   . Anxiety disorder 07/10/2015  . Arthritis   . Asthma   . Asthma with bronchitis 04/19/2010   PFT 09/18/2010-FEV1  1.95/92; FEV1/FVC 0.76; FEF 25-75% improved 48% with bronchodilator; normal lung volumes; DLCO 78%    . CAP (community acquired pneumonia) 03/20/2016  . Depression   . Essential  hypertension 04/21/2010   Qualifier: Diagnosis of  By: Annamaria Boots MD, Clinton D   . History of kidney stones   . Hyperlipidemia 11/25/2017  . Hypertension   . Osteoarthritis of both knees 07/10/2015  . Pneumonia    hx of   . Seasonal and perennial allergic rhinitis 04/19/2010   Allergy Profile 04/19/2010-negative, IgE  3.8 Allergy skin test 11/07/2010-positive for grass, weed, tree pollens, dust mite   . Severe obesity (BMI 35.0-39.9) with comorbidity ([place]) 01/11/2016  . Vitamin D deficiency 07/10/2015   No Known Allergies  Social History   Socioeconomic History  . Marital status: Married    Spouse name: [name]  . Number of children: 0  . Years of education: College  . Highest education level: Not on file  Occupational History  . Occupation: retired  Tobacco Use  . Smoking status: Never  . Smokeless tobacco: Never  Vaping Use  . Vaping Use: Never used  Substance and Sexual Activity  . Alcohol use: No    Alcohol/week: 0.0 standard drinks  . Drug use: No  . Sexual activity: Yes    Birth control/protection: Surgical  Other Topics Concern  . Not on file  Social History Narrative   Patient lives at home with spouse.   Caffeine Use:    Social Determinants of Health   Financial Resource Strain: Low Risk   . Difficulty of Paying Living Expenses: Not hard at all  Food Insecurity: Not on file  Transportation Needs: No Transportation Needs  . Lack of Transportation (Medical): No  . Lack of Transportation (Non-Medical): No  Physical Activity: Not on file  Stress: Not on file  Social Connections: Not on file   Vitals:   06/25/21 0941  BP: 130/88  Pulse: 77  Resp: 16  Temp: 98.3 F (36.8 C)  SpO2: 98%   Wt Readings from Last 3 Encounters:  01/08/21 203 lb 9.6 oz (92.4 kg)  12/25/20 195 lb 12.8 oz (88.8 kg)  04/04/20 207 lb (93.9 kg)   Body mass index is 36.07 kg/m.  Physical Exam Vitals and nursing note reviewed.  Constitutional:      General: She is not in acute  distress.    Appearance: She is well-developed.  HENT:     Head: Normocephalic and atraumatic.     Mouth/Throat:     Mouth: Mucous membranes are moist.     Pharynx: Oropharynx is clear.  Eyes:     Conjunctiva/sclera: Conjunctivae normal.  Cardiovascular:     Rate and Rhythm: Normal rate and regular rhythm.     Pulses:          Dorsalis pedis pulses are 2+ on the right side and 2+ on the left side.     Heart sounds: No murmur heard. Pulmonary:     Effort: Pulmonary effort is normal. No respiratory distress.     Breath sounds: Normal breath sounds.  Abdominal:  Palpations: Abdomen is soft. There is no hepatomegaly or mass.     Tenderness: There is no abdominal tenderness.  Lymphadenopathy:     Cervical: No cervical adenopathy.  Skin:    General: Skin is warm.     Findings: No erythema or rash.  Neurological:     General: No focal deficit present.     Mental Status: She is alert and oriented to person, place, and time.     Cranial Nerves: No cranial nerve deficit.     Gait: Gait normal.  Psychiatric:        Mood and Affect: Mood is anxious. Affect is labile.     Comments: Well groomed, good eye contact.   ASSESSMENT AND PLAN:  [name] was seen today for follow-up.  Diagnoses and all orders for this visit: Orders Placed This Encounter  Procedures  . Pneumococcal polysaccharide vaccine 23-valent greater than or equal to 2yo subcutaneous/IM  . Hepatitis C antibody  . Basic metabolic panel   Lab Results  Component Value Date   CREATININE 0.75 06/25/2021   BUN 20 06/25/2021   NA 143 06/25/2021   K 4.6 06/25/2021   CL 104 06/25/2021   CO2 33 (H) 06/25/2021   Osteoarthritis of both knees Improved after bilateral TKR. Continue Aleve 220 mg bid prn. Wt loss may help. Some side effects of NSAID's.  Essential hypertension BP adequately controlled. Continue current management: Benicar 20 mg daily. DASH/low salt diet to continue. Continue monitoring BP at  home. Eye exam recommended annually.  Anxiety disorder Stable otherwise. Continue Duloxetine 60 mg daily and Valium 5 mg daily prn.  Severe obesity (BMI 35.0-39.9) with comorbidity ([place]) She understands the benefits of wt loss as well as adverse effects of obesity. Consistency with healthy diet and physical activity recommended.  Encounter for HCV screening test for low risk patient -     Hepatitis C antibody  Need for pneumococcal vaccine -     Pneumococcal polysaccharide vaccine 23-valent greater than or equal to 2yo subcutaneous/IM  I spent a total of 53 minutes in both face to face and non face to face activities for this visit on the date of this encounter. During this time history was obtained and documented, examination was performed, prior labs reviewed, and assessment/plan discussed.  Return in about 6 months (around 12/26/2021) for CPE.  Selam Pietsch G. Martinique, MD  [facility]. [place] office.

## 2021-07-31 ENCOUNTER — Telehealth: Payer: Medicare Other

## 2021-08-18 ENCOUNTER — Other Ambulatory Visit: Payer: Self-pay | Admitting: Family Medicine

## 2021-08-18 DIAGNOSIS — I10 Essential (primary) hypertension: Secondary | ICD-10-CM

## 2021-08-30 ENCOUNTER — Telehealth: Payer: Self-pay | Admitting: Family Medicine

## 2021-08-30 NOTE — Telephone Encounter (Signed)
Patient called because she was supposed to get a second vaccine from the pharmacy after her last visit in May, but she cannot remember what the name of it was. Patient states that she got the first vaccine here in office and was then told she could go to the pharmacy for the second one. Patient would like a callback if possible. If she does not answer a voicemail will suffice.       Good callback number is [phone]      Please advise

## 2021-08-31 NOTE — Telephone Encounter (Signed)
I called and spoke with patient. She is aware it is the Shingles vaccine.

## 2021-09-27 ENCOUNTER — Other Ambulatory Visit: Payer: Self-pay | Admitting: Family Medicine

## 2021-09-27 DIAGNOSIS — E559 Vitamin D deficiency, unspecified: Secondary | ICD-10-CM

## 2021-11-22 ENCOUNTER — Other Ambulatory Visit: Payer: Medicare Other

## 2021-12-20 ENCOUNTER — Encounter: Payer: Self-pay | Admitting: Family Medicine

## 2021-12-26 ENCOUNTER — Encounter: Payer: Medicare Other | Admitting: Family Medicine

## 2022-01-06 NOTE — Progress Notes (Unsigned)
    Patient ID: Teresa Murphy, female    DOB: 1950-06-22, 71 y.o.   MRN: [id number]  HPI female nonsmoker followed for Asthma, Allergic rhinitis, complicated by HBP Allergy profile 04/19/10- neg, IGe 3.8 She has new HVAC at home and new moisture barrier.  PFT-09/18/10- mild obstruction with response to BD. FEV1 1.95/ 92%; FEV1/FVC 0.76; FEF25-75 improved 48% w/ bronchodilator Skin test: Allergy skin tests show significant positives particularly for grass, tree and dust mite.  ---------------------------------------------------------------------------------------   01/08/21- 71 year old female never smoker followed for Asthma, Allergic rhinitis, complicated by HTN, Glaucoma,  -Singulair, Advair 250, ProAir hfa Covid vax-3 Phizer Flu vax-had  Has had bilateral TKR w/o respiratory issues. No significant asthma or rhinitis flair. [place] died ESRD[place]  01/08/22- - 71 year old female never smoker followed for Asthma, Allergic rhinitis, complicated by HTN, Glaucoma, Obesity, Osteoarthritis,  -Singulair, Advair 250, ProAir hfa, Zyrtec,  Covid vax-4 Phizer Flu vax-had -----Pt is doing well ACT score- 20 Reports doing well with current meds. Needs Proair refilled. No significant flare. No cardiac issues.  Mild nasal drainage- Zyrtec sufficient.  Review of Systems- see HPI   + = positive Constitutional:   No weight loss, night sweats,  Fevers, chills, fatigue, lassitude. HEENT:   No- headaches,  Difficulty swallowing,  Tooth/dental problems,  Sore throat,  CV:  No chest pain,  Orthopnea, PND, swelling in lower extremities, anasarca, dizziness, palpitations GI  No heartburn, indigestion, abdominal pain, nausea, vomiting,  Resp: No shortness of breath with exertion or at rest.  No excess mucus, no productive cough,  No non-productive cough,  No coughing up of blood.  No change in color of mucus. wheezing-none.  Skin: no rash or lesions. GU:  MS:  No joint pain  or swelling.  No decreased range of motion.  No back pain. Psych:  No change in mood or affect. No depression or anxiety.  No memory loss.  Objective:   Physical Exam General- Alert, Oriented, Affect-appropriate, Distress- none acute. + Overweight Skin- rash-none, lesions- none, excoriation- none, + lipoma L shoulder Lymphadenopathy- none Head- atraumatic            Eyes- Gross vision intact, PERRLA, conjunctivae clear secretions            Ears- Hearing, canals normal            Nose- nose clear, no-Septal dev, no-polyps, erosion, perforation             Throat- Mallampati III , mucosa clear , drainage- none, tonsils- atrophic,  Neck- flexible , trachea midline, no stridor , thyroid nl, carotid no bruit Chest - symmetrical excursion , unlabored           Heart/CV- RRR , no murmur , no gallop  , no rub, nl s1 s2                           - JVD- none , edema- none, stasis changes- none, varices- none           Lung- clear to P&A, wheeze- none,  cough-none, dullness-none, rub- none           Chest wall- + lipoma L shoulder Abd-  Br/ Gen/ Rectal- Not done, not indicated Extrem- cyanosis- none, clubbing, none, atrophy- none, strength- nl, TKR scars- healing well Neuro- grossly intact to observation

## 2022-01-08 ENCOUNTER — Encounter: Payer: Self-pay | Admitting: Internal Medicine

## 2022-01-08 ENCOUNTER — Ambulatory Visit (INDEPENDENT_AMBULATORY_CARE_PROVIDER_SITE_OTHER): Payer: Medicare Other | Admitting: Internal Medicine

## 2022-01-08 VITALS — BP 128/88 | HR 100 | Ht 63.0 in | Wt 232.0 lb

## 2022-01-08 DIAGNOSIS — J302 Other seasonal allergic rhinitis: Secondary | ICD-10-CM | POA: Diagnosis not present

## 2022-01-08 DIAGNOSIS — J45909 Unspecified asthma, uncomplicated: Secondary | ICD-10-CM

## 2022-01-08 DIAGNOSIS — J3089 Other allergic rhinitis: Secondary | ICD-10-CM | POA: Diagnosis not present

## 2022-01-08 MED ORDER — ALBUTEROL SULFATE HFA 108 (90 BASE) MCG/ACT IN AERS: 2.0000 | INHALATION_SPRAY | RESPIRATORY_TRACT | 12 refills | Status: AC | PRN

## 2022-01-08 NOTE — Assessment & Plan Note (Signed)
Mild intermittent uncomplicated Plan- refill Proair, otherwise no change

## 2022-01-08 NOTE — Progress Notes (Unsigned)
HPI: Teresa Murphy is a 71 y.o. female, who is here today for her routine physical.  Last CPE: 12/25/2020  Regular exercise 3 or more time per week: *** Following a healthy diet: ***  Chronic medical problems: ***  Immunization History  Administered Date(s) Administered  . COVID-19, mRNA, vaccine(Comirnaty)12 years and older 12/03/2021  . Fluad Quad(high Dose 65+) 12/21/2019, 12/25/2020, 12/03/2021  . Influenza Split 11/07/2010, 11/05/2011, 11/04/2012  . Influenza, High Dose Seasonal PF 01/11/2016, 12/05/2016, 02/07/2018, 12/21/2018  . Influenza,inj,quad, With Preservative 11/04/2016  . Influenza-Unspecified 12/05/2016, 12/03/2021  . PFIZER(Purple Top)SARS-COV-2 Vaccination 03/29/2019, 04/19/2019, 11/20/2019  . Pneumococcal Conjugate-13 02/04/2013  . Pneumococcal Polysaccharide-23 04/30/2012, 06/25/2021   Health Maintenance  Topic Date Due  . DTaP/Tdap/Td (1 - Tdap) Never done  . Medicare Annual Wellness (AWV)  09/24/2017  . Zoster Vaccines- Shingrix (1 of 2) 02/20/2026 (Originally 03/13/1998)  . MAMMOGRAM  06/08/2023  . COLONOSCOPY (Pts 45-37yr Insurance coverage will need to be confirmed)  11/04/2028  . Pneumonia Vaccine 71 Years old  Completed  . INFLUENZA VACCINE  Completed  . DEXA SCAN  Completed  . COVID-19 Vaccine  Completed  . Hepatitis C Screening  Completed  . HPV VACCINES  Aged Out    She has *** concerns today.  Review of Systems  Current Outpatient Medications on File Prior to Visit  Medication Sig Dispense Refill  . albuterol (PROAIR HFA) 108 (90 Base) MCG/ACT inhaler Inhale 2 puffs into the lungs every 4 (four) hours as needed. 1 each 12  . carboxymethylcellulose (REFRESH PLUS) 0.5 % SOLN 1 drop 3 (three) times daily as needed.    . cetirizine (ZYRTEC) 10 MG tablet Take 10 mg by mouth at bedtime.     . Cinnamon 500 MG capsule Take 500 mg by mouth daily.    . diazepam (VALIUM) 5 MG tablet TAKE 1 TABLET BY MOUTH EVERY DAY AS NEEDED FOR ANXIETY 30  tablet 2  . DULoxetine (CYMBALTA) 60 MG capsule Take 1 capsule by mouth once daily 90 capsule 2  . Fluticasone-Salmeterol (ADVAIR DISKUS) 250-50 MCG/DOSE AEPB INHALE 1 PUFF INTO LUNGS TWICE A DAY RINSE AFTER USE 3 each 0  . Glucos-Chond-Hyal Ac-Ca Fructo (MOVE FREE JOINT HEALTH ADVANCE PO) Take 1 tablet by mouth daily.    .Marland Kitchenlatanoprost (XALATAN) 0.005 % ophthalmic solution Place 1 drop into both eyes at bedtime.  11  . Magnesium 250 MG TABS Take 1 tablet by mouth daily.    . montelukast (SINGULAIR) 10 MG tablet TAKE 1 TABLET BY MOUTH ONCE DAILY AT BEDTIME 30 tablet 12  . Multiple Vitamins-Minerals (MULTIVITAMIN WOMEN 50+) TABS Take 1 tablet by mouth daily.    .Marland Kitchenolmesartan (BENICAR) 20 MG tablet Take 1 tablet by mouth once daily 90 tablet 2  . rosuvastatin (CRESTOR) 10 MG tablet Take 1 tablet by mouth once daily 90 tablet 2  . vitamin B-12 (CYANOCOBALAMIN) 500 MCG tablet Take 500 mcg by mouth daily.    . Vitamin D, Ergocalciferol, (DRISDOL) 1.25 MG (50000 UNIT) CAPS capsule TAKE 1 CAPSULE BY MOUTH EVERY 10 DAYS. 10 capsule 1   No current facility-administered medications on file prior to visit.    Past Medical History:  Diagnosis Date  . Allergic rhinitis   . Anxiety   . Anxiety disorder 07/10/2015  . Arthritis   . Asthma   . Asthma with bronchitis 04/19/2010   PFT 09/18/2010-FEV1  1.95/92; FEV1/FVC 0.76; FEF 25-75% improved 48% with bronchodilator; normal lung volumes; DLCO 78%    .  CAP (community acquired pneumonia) 03/20/2016  . Depression   . Essential hypertension 04/21/2010   Qualifier: Diagnosis of  By: Annamaria Boots MD, Clinton D   . History of kidney stones   . Hyperlipidemia 11/25/2017  . Hypertension   . Osteoarthritis of both knees 07/10/2015  . Pneumonia    hx of   . Seasonal and perennial allergic rhinitis 04/19/2010   Allergy Profile 04/19/2010-negative, IgE  3.8 Allergy skin test 11/07/2010-positive for grass, weed, tree pollens, dust mite   . Severe obesity (BMI 35.0-39.9) with  comorbidity ([place]) 01/11/2016  . Vitamin D deficiency 07/10/2015    Past Surgical History:  Procedure Laterality Date  . CHOLECYSTECTOMY    . TONSILLECTOMY    . TOTAL ABDOMINAL HYSTERECTOMY    . TOTAL KNEE ARTHROPLASTY Right 01/18/2020   Procedure: TOTAL KNEE ARTHROPLASTY;  Surgeon: Paralee Cancel, MD;  Location: [facility];  Service: Orthopedics;  Laterality: Right;  70 mins  . TOTAL KNEE ARTHROPLASTY Left 04/04/2020   Procedure: TOTAL KNEE ARTHROPLASTY;  Surgeon: Paralee Cancel, MD;  Location: [facility];  Service: Orthopedics;  Laterality: Left;  70 mins    No Known Allergies  Family History  Problem Relation Age of Onset  . Dementia Mother   . Multiple myeloma Mother   . Lung cancer Father   . Asthma Sister   . Sleep apnea Sister   . Heart disease Other   . Allergic rhinitis Other   . Cancer Other     Social History   Socioeconomic History  . Marital status: Widowed    Spouse name: [name]  . Number of children: 0  . Years of education: College  . Highest education level: Not on file  Occupational History  . Occupation: retired  Tobacco Use  . Smoking status: Never  . Smokeless tobacco: Never  Vaping Use  . Vaping Use: Never used  Substance and Sexual Activity  . Alcohol use: No    Alcohol/week: 0.0 standard drinks of alcohol  . Drug use: No  . Sexual activity: Yes    Birth control/protection: Surgical  Other Topics Concern  . Not on file  Social History Narrative   Patient lives at home with spouse.   Caffeine Use:    Social Determinants of Health   Financial Resource Strain: Low Risk  (08/02/2020)   Overall Financial Resource Strain (CARDIA)   . Difficulty of Paying Living Expenses: Not hard at all  Food Insecurity: Not on file  Transportation Needs: No Transportation Needs (08/02/2020)   [place] - Transportation   . Lack of Transportation (Medical): No   . Lack of Transportation (Non-Medical): No  Physical Activity: Not on file  Stress: Not on file  Social  Connections: Not on file    There were no vitals filed for this visit. There is no height or weight on file to calculate BMI.  Wt Readings from Last 3 Encounters:  01/08/22 232 lb (105.2 kg)  01/08/21 203 lb 9.6 oz (92.4 kg)  12/25/20 195 lb 12.8 oz (88.8 kg)    Physical Exam Vitals and nursing note reviewed.  Constitutional:      General: She is not in acute distress.    Appearance: She is well-developed.  HENT:     Head: Normocephalic and atraumatic.     Right Ear: Hearing, tympanic membrane, ear canal and external ear normal.     Left Ear: Hearing, tympanic membrane, ear canal and external ear normal.     Mouth/Throat:     Mouth: Mucous  membranes are moist.     Pharynx: Oropharynx is clear. Uvula midline.  Eyes:     Extraocular Movements: Extraocular movements intact.     Conjunctiva/sclera: Conjunctivae normal.     Pupils: Pupils are equal, round, and reactive to light.  Neck:     Thyroid: No thyromegaly.     Trachea: No tracheal deviation.  Cardiovascular:     Rate and Rhythm: Normal rate and regular rhythm.     Pulses:          Dorsalis pedis pulses are 2+ on the right side and 2+ on the left side.       Posterior tibial pulses are 2+ on the right side and 2+ on the left side.     Heart sounds: No murmur heard. Pulmonary:     Effort: Pulmonary effort is normal. No respiratory distress.     Breath sounds: Normal breath sounds.  Abdominal:     Palpations: Abdomen is soft. There is no hepatomegaly or mass.     Tenderness: There is no abdominal tenderness.  Genitourinary:    Comments: Deferred to gyn. Musculoskeletal:     Comments: No major deformity or signs of synovitis appreciated.  Lymphadenopathy:     Cervical: No cervical adenopathy.     Upper Body:     Right upper body: No supraclavicular adenopathy.     Left upper body: No supraclavicular adenopathy.  Skin:    General: Skin is warm.     Findings: No erythema or rash.  Neurological:     General: No  focal deficit present.     Mental Status: She is alert and oriented to person, place, and time.     Cranial Nerves: No cranial nerve deficit.     Coordination: Coordination normal.     Gait: Gait normal.     Deep Tendon Reflexes:     Reflex Scores:      Bicep reflexes are 2+ on the right side and 2+ on the left side.      Patellar reflexes are 2+ on the right side and 2+ on the left side. Psychiatric:     Comments: Well groomed, good eye contact.   ASSESSMENT AND PLAN: Teresa Murphy was here today annual physical examination.  No orders of the defined types were placed in this encounter.   There are no diagnoses linked to this encounter.  There are no diagnoses linked to this encounter.  No follow-ups on file.  Kinzlee Selvy G. [place], MD  [facility]. [place] office.

## 2022-01-08 NOTE — Assessment & Plan Note (Signed)
Zyrtec currently sufficient

## 2022-01-08 NOTE — Patient Instructions (Signed)
Proair refilled  Please call if we can help

## 2022-01-09 ENCOUNTER — Encounter: Payer: Self-pay | Admitting: Family Medicine

## 2022-01-09 ENCOUNTER — Ambulatory Visit (INDEPENDENT_AMBULATORY_CARE_PROVIDER_SITE_OTHER): Payer: Medicare Other | Admitting: Family Medicine

## 2022-01-09 VITALS — BP 128/80 | HR 88 | Temp 98.6°F | Resp 16 | Ht 63.0 in | Wt 231.5 lb

## 2022-01-09 DIAGNOSIS — E785 Hyperlipidemia, unspecified: Secondary | ICD-10-CM | POA: Diagnosis not present

## 2022-01-09 DIAGNOSIS — Z0000 Encounter for general adult medical examination without abnormal findings: Secondary | ICD-10-CM

## 2022-01-09 DIAGNOSIS — E559 Vitamin D deficiency, unspecified: Secondary | ICD-10-CM | POA: Diagnosis not present

## 2022-01-09 DIAGNOSIS — F411 Generalized anxiety disorder: Secondary | ICD-10-CM

## 2022-01-09 DIAGNOSIS — I10 Essential (primary) hypertension: Secondary | ICD-10-CM | POA: Diagnosis not present

## 2022-01-09 DIAGNOSIS — M170 Bilateral primary osteoarthritis of knee: Secondary | ICD-10-CM

## 2022-01-09 LAB — COMPREHENSIVE METABOLIC PANEL
ALT: 17 U/L (ref 0–35)
AST: 23 U/L (ref 0–37)
Albumin: 4.4 g/dL (ref 3.5–5.2)
Alkaline Phosphatase: 73 U/L (ref 39–117)
BUN: 12 mg/dL (ref 6–23)
CO2: 32 mEq/L (ref 19–32)
Calcium: 9.1 mg/dL (ref 8.4–10.5)
Chloride: 105 mEq/L (ref 96–112)
Creatinine, Ser: 0.83 mg/dL (ref 0.40–1.20)
GFR: 69.74 mL/min (ref >60.00)
Glucose, Bld: 94 mg/dL (ref 70–99)
Potassium: 4.4 mEq/L (ref 3.5–5.1)
Sodium: 141 mEq/L (ref 135–145)
Total Bilirubin: 0.7 mg/dL (ref 0.2–1.2)
Total Protein: 7.4 g/dL (ref 6.0–8.3)

## 2022-01-09 LAB — LIPID PANEL
Cholesterol: 136 mg/dL (ref 0–200)
HDL: 44 mg/dL (ref >39.00)
LDL Cholesterol: 66 mg/dL (ref 0–99)
NonHDL: 92.08
Total CHOL/HDL Ratio: 3
Triglycerides: 130 mg/dL (ref 0.0–149.0)
VLDL: 26 mg/dL (ref 0.0–40.0)

## 2022-01-09 LAB — VITAMIN D 25 HYDROXY (VIT D DEFICIENCY, FRACTURES): VITD: 47.51 ng/mL (ref 30.00–100.00)

## 2022-01-09 NOTE — Patient Instructions (Addendum)
A few things to remember from today's visit:  Routine general medical examination at a health care facility  Hyperlipidemia, unspecified hyperlipidemia type - Plan: Comprehensive metabolic panel, Lipid panel  Essential hypertension - Plan: Comprehensive metabolic panel  Vitamin D deficiency, unspecified - Plan: VITAMIN D 25 Hydroxy (Vit-D Deficiency, Fractures) Preventive Care 65 Years and Older, Female Preventive care refers to lifestyle choices and visits with your health care provider that can promote health and wellness. Preventive care visits are also called wellness exams. What can I expect for my preventive care visit? Counseling Your health care provider may ask you questions about your: Medical history, including: Past medical problems. Family medical history. Pregnancy and menstrual history. History of falls. Current health, including: Memory and ability to understand (cognition). Emotional well-being. Home life and relationship well-being. Sexual activity and sexual health. Lifestyle, including: Alcohol, nicotine or tobacco, and drug use. Access to firearms. Diet, exercise, and sleep habits. Work and work Statistician. Sunscreen use. Safety issues such as seatbelt and bike helmet use. Physical exam Your health care provider will check your: Height and weight. These may be used to calculate your BMI (body mass index). BMI is a measurement that tells if you are at a healthy weight. Waist circumference. This measures the distance around your waistline. This measurement also tells if you are at a healthy weight and may help predict your risk of certain diseases, such as type 2 diabetes and high blood pressure. Heart rate and blood pressure. Body temperature. Skin for abnormal spots. What immunizations do I need?  Vaccines are usually given at various ages, according to a schedule. Your health care provider will recommend vaccines for you based on your age, medical  history, and lifestyle or other factors, such as travel or where you work. What tests do I need? Screening Your health care provider may recommend screening tests for certain conditions. This may include: Lipid and cholesterol levels. Hepatitis C test. Hepatitis B test. HIV (human immunodeficiency virus) test. STI (sexually transmitted infection) testing, if you are at risk. Lung cancer screening. Colorectal cancer screening. Diabetes screening. This is done by checking your blood sugar (glucose) after you have not eaten for a while (fasting). Mammogram. Talk with your health care provider about how often you should have regular mammograms. BRCA-related cancer screening. This may be done if you have a family history of breast, ovarian, tubal, or peritoneal cancers. Bone density scan. This is done to screen for osteoporosis. Talk with your health care provider about your test results, treatment options, and if necessary, the need for more tests. Follow these instructions at home: Eating and drinking  Eat a diet that includes fresh fruits and vegetables, whole grains, lean protein, and low-fat dairy products. Limit your intake of foods with high amounts of sugar, saturated fats, and salt. Take vitamin and mineral supplements as recommended by your health care provider. Do not drink alcohol if your health care provider tells you not to drink. If you drink alcohol: Limit how much you have to 0-1 drink a day. Know how much alcohol is in your drink. In the [place] one drink equals one 12 oz bottle of beer (355 mL), one 5 oz glass of wine (148 mL), or one 1 oz glass of hard liquor (44 mL). Lifestyle Brush your teeth every morning and night with fluoride toothpaste. Floss one time each day. Exercise for at least 30 minutes 5 or more days each week. Do not use any products that contain nicotine or tobacco. These  products include cigarettes, chewing tobacco, and vaping devices, such as e-cigarettes.  If you need help quitting, ask your health care provider. Do not use drugs. If you are sexually active, practice safe sex. Use a condom or other form of protection in order to prevent STIs. Take aspirin only as told by your health care provider. Make sure that you understand how much to take and what form to take. Work with your health care provider to find out whether it is safe and beneficial for you to take aspirin daily. Ask your health care provider if you need to take a cholesterol-lowering medicine (statin). Find healthy ways to manage stress, such as: Meditation, yoga, or listening to music. Journaling. Talking to a trusted person. Spending time with friends and family. Minimize exposure to UV radiation to reduce your risk of skin cancer. Safety Always wear your seat belt while driving or riding in a vehicle. Do not drive: If you have been drinking alcohol. Do not ride with someone who has been drinking. When you are tired or distracted. While texting. If you have been using any mind-altering substances or drugs. Wear a helmet and other protective equipment during sports activities. If you have firearms in your house, make sure you follow all gun safety procedures. What's next? Visit your health care provider once a year for an annual wellness visit. Ask your health care provider how often you should have your eyes and teeth checked. Stay up to date on all vaccines. This information is not intended to replace advice given to you by your health care provider. Make sure you discuss any questions you have with your health care provider. Document Revised: 07/19/2020 Document Reviewed: 07/19/2020 Elsevier Patient Education  [place].  If you need refills for medications you take chronically, please call your pharmacy. Do not use My Chart to request refills or for acute issues that need immediate attention. If you send a my chart message, it may take a few days to be  addressed, specially if I am not in the office.  Please be sure medication list is accurate. If a new problem present, please set up appointment sooner than planned today.

## 2022-01-10 MED ORDER — DULOXETINE HCL 60 MG PO CPEP: 60.0000 mg | ORAL_CAPSULE | Freq: Every day | ORAL | 3 refills | Status: DC

## 2022-01-10 MED ORDER — VITAMIN D (ERGOCALCIFEROL) 1.25 MG (50000 UNIT) PO CAPS: ORAL_CAPSULE | ORAL | 2 refills | Status: DC

## 2022-01-10 NOTE — Assessment & Plan Note (Signed)
BP adequately controlled. Continue Benicar 20 mg daily. DASH/low salt diet recommended. Eye exam recommended annually.

## 2022-01-10 NOTE — Assessment & Plan Note (Signed)
We discussed the importance of regular physical activity and healthy diet for prevention of chronic illness and/or complications. Preventive guidelines reviewed. Vaccination up to date. Ca++ and vit D supplementation to continue. Next CPE in a year. 

## 2022-01-10 NOTE — Assessment & Plan Note (Signed)
No changes in ergocalciferol 50,000 U q 2 weeks. Further recommendations according to 25 OH vit D result.

## 2022-01-10 NOTE — Assessment & Plan Note (Signed)
Continue Rosuvastatin 10 mg daily. Low fat diet also recommended.

## 2022-01-14 ENCOUNTER — Telehealth: Payer: Self-pay | Admitting: Family Medicine

## 2022-01-14 NOTE — Telephone Encounter (Signed)
Spoke with patient to schedule Medicare Annual Wellness Visit (AWV) either virtually or in office.   Patient wanted a call back after christmas  Last AWV 12/25/20 please schedule with Nurse Health Adviser   45 min for awv-i and in office appointments 30 min for awv-s  phone/virtual appointments

## 2022-01-16 IMAGING — US US SOFT TISSUE HEAD/NECK
1 series · 14 of 14 positions shown · non-contrast
Comparison: None.

CLINICAL DATA: Left supraclavicular fullness for the past week.

EXAM:
ULTRASOUND OF HEAD/NECK SOFT TISSUES
TECHNIQUE: Ultrasound examination of the head and neck soft tissues was
performed in the area of clinical concern.

[Series 1: us soft tissue head/neck · 0.07mm/px · 14 acquisitions, 14 frames shown]
[im 1/14]
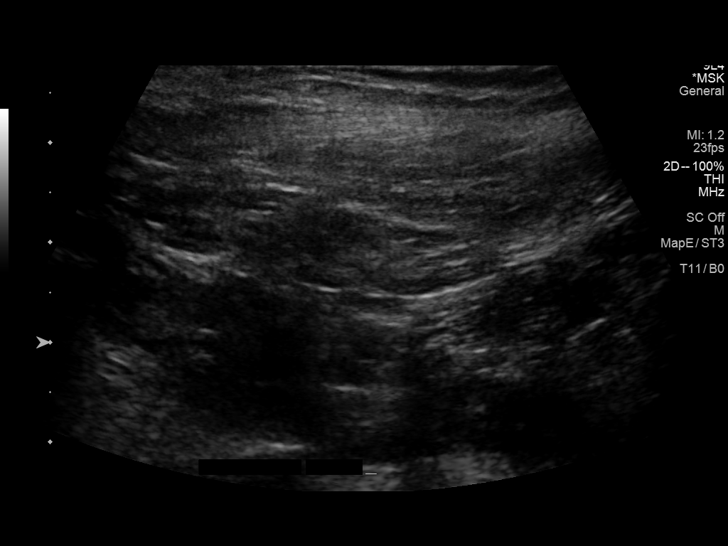
[im 2/14]
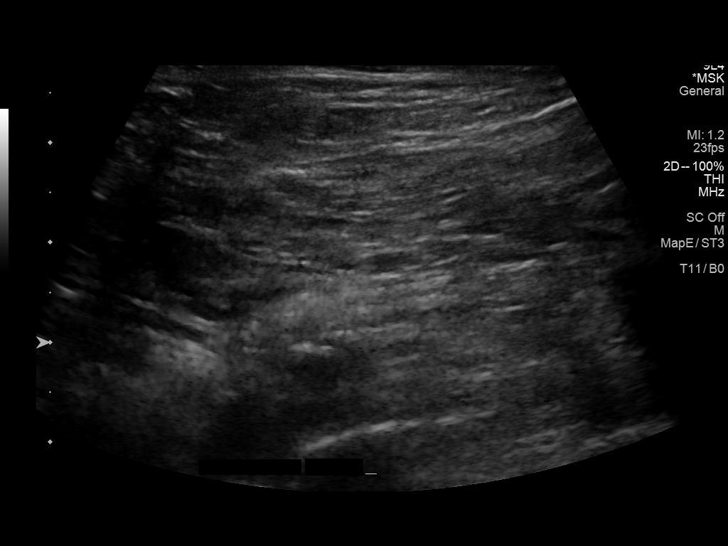
[im 3/14]
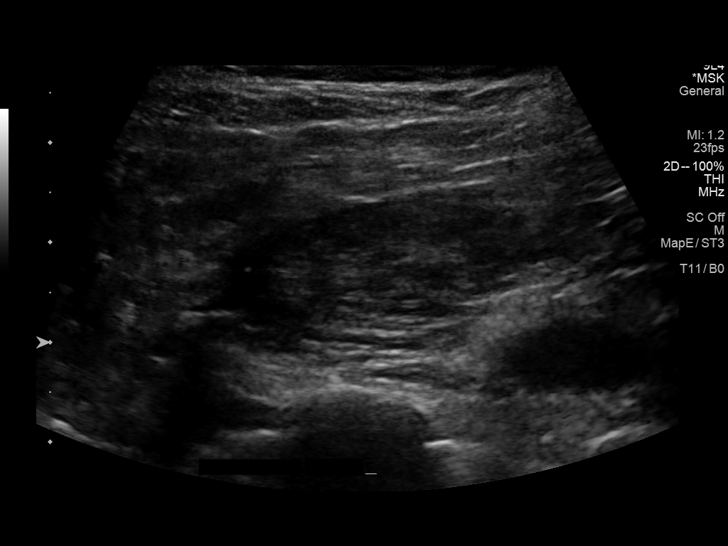
[im 4/14]
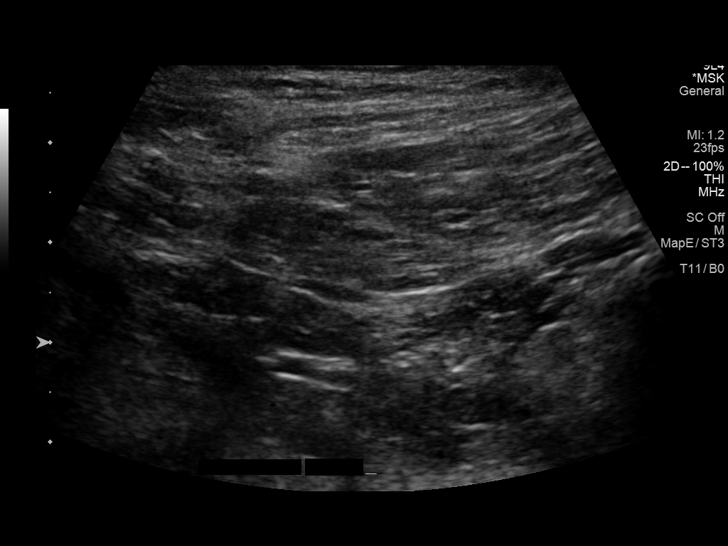
[im 5/14]
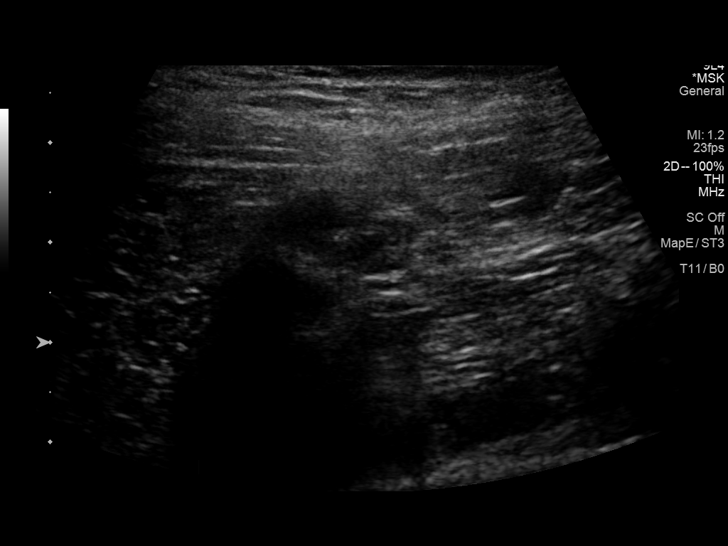
[im 6/14]
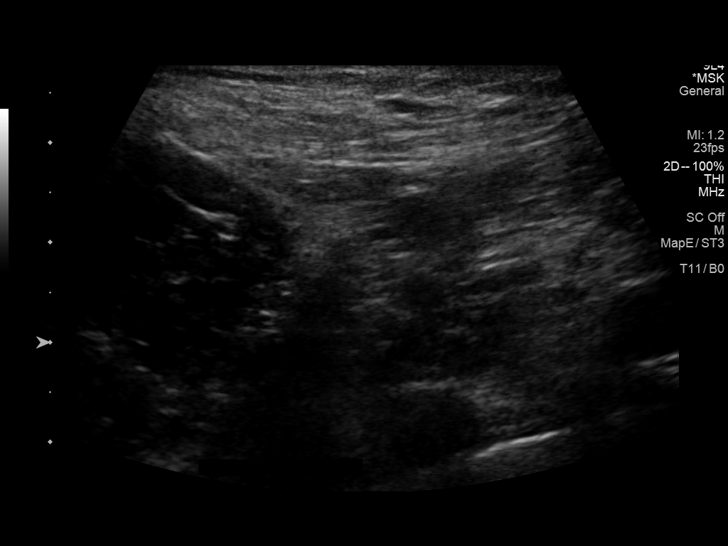
[im 7/14]
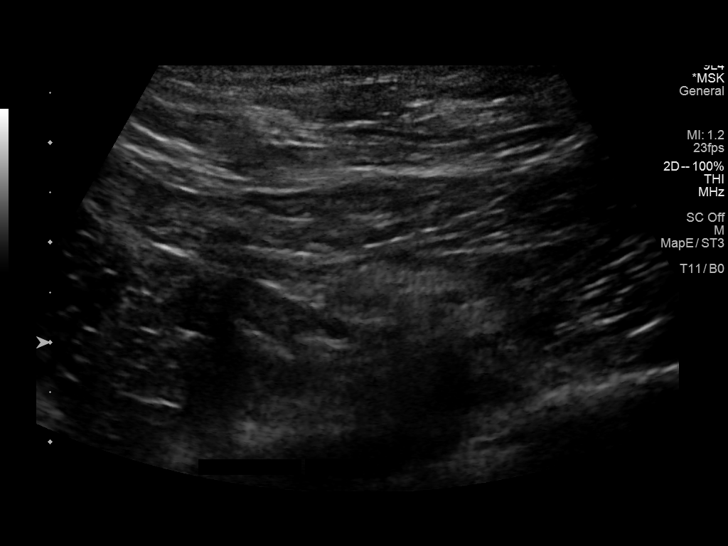
[im 8/14]
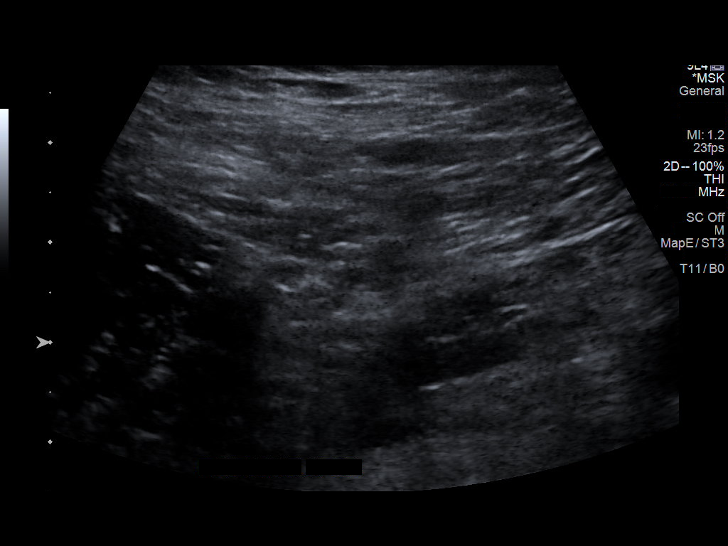
[im 9/14]
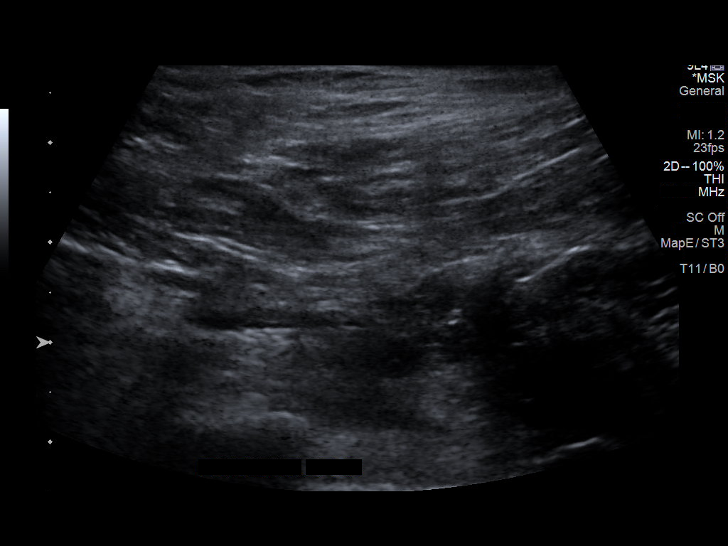
[im 10/14]
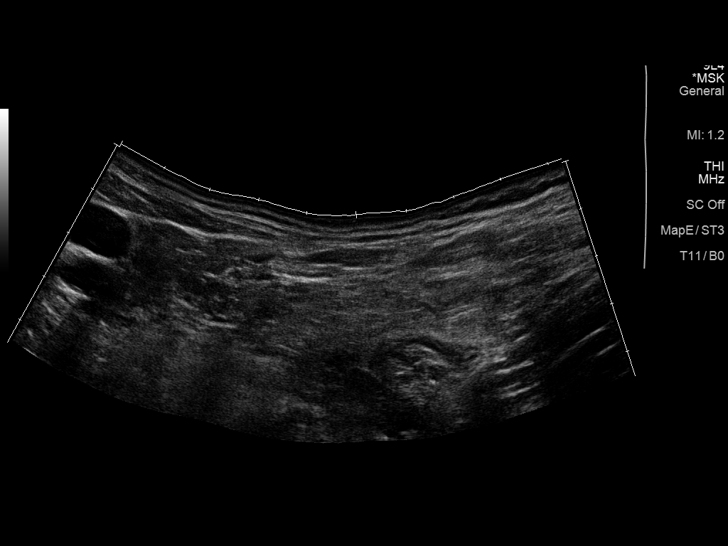
[im 11/14]
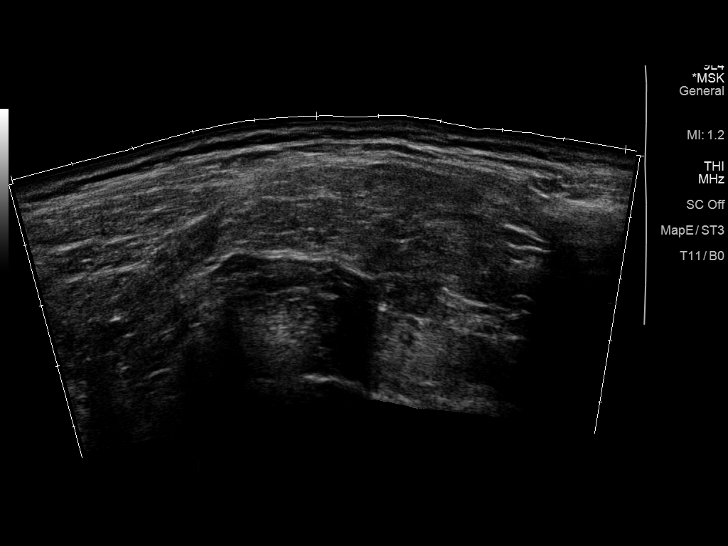
[im 12/14]
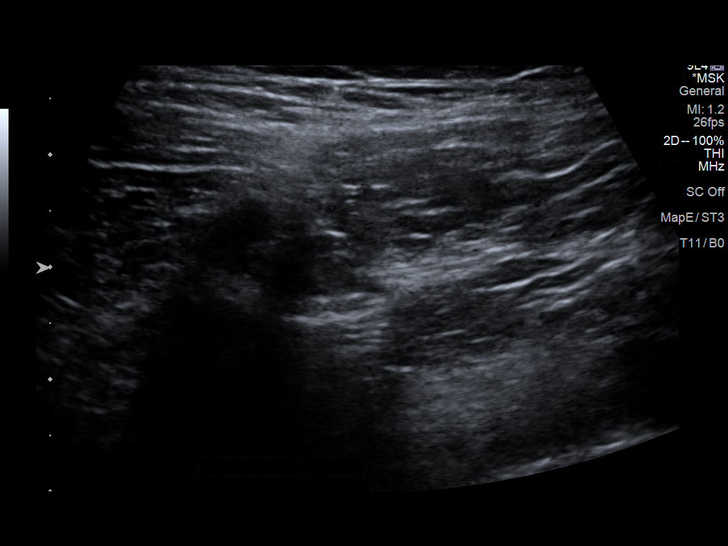
[im 13/14]
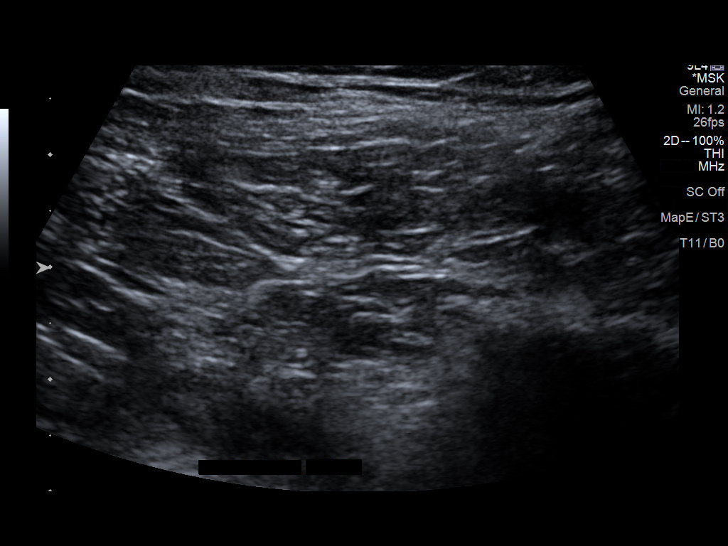
[im 14/14]
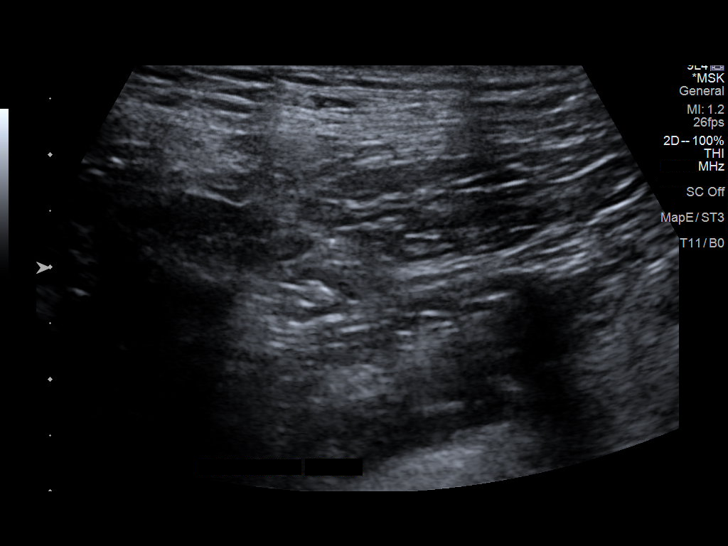

[14 of 14 positions shown; findings below may reference images not displayed]

FINDINGS: Sonographic evaluation of the patient's palpable area of concern
within the left supraclavicular fossa is negative for discrete
sonographic correlate. Specifically, no regional supraclavicular
lymphadenopathy. No discrete solid or cystic lesions.
IMPRESSION: No sonographic correlate for patient's palpable area of concern
involving the left supraclavicular fossa.

## 2022-02-12 ENCOUNTER — Ambulatory Visit (INDEPENDENT_AMBULATORY_CARE_PROVIDER_SITE_OTHER): Payer: Medicare Other

## 2022-02-12 VITALS — Ht 63.0 in | Wt 231.0 lb

## 2022-02-12 DIAGNOSIS — Z0000 Encounter for general adult medical examination without abnormal findings: Secondary | ICD-10-CM | POA: Diagnosis not present

## 2022-02-12 NOTE — Progress Notes (Signed)
Subjective:   Teresa Murphy is a 72 y.o. female who presents for Medicare Annual (Subsequent) preventive examination.  Review of Systems    Virtual Visit via Telephone Note  I connected with  Teresa Murphy on 02/12/22 at  3:15 PM EST by telephone and verified that I am speaking with the correct person using two identifiers.  Location: Patient: Home Provider: Office Persons participating in the virtual visit: patient/Nurse Health Advisor   I discussed the limitations, risks, security and privacy concerns of performing an evaluation and management service by telephone and the availability of in person appointments. The patient expressed understanding and agreed to proceed.  Interactive audio and video telecommunications were attempted between this nurse and patient, however failed, due to patient having technical difficulties OR patient did not have access to video capability.  We continued and completed visit with audio only.  Some vital signs may be absent or patient reported.   Criselda Peaches, LPN  Cardiac Risk Factors include: advanced age (>29mn, >>35women);hypertension     Objective:    Today's Vitals   02/12/22 1527  Weight: 231 lb (104.8 kg)  Height: '5\' 3"'$  (1.6 m)   Body mass index is 40.92 kg/m.     02/12/2022    3:38 PM 04/04/2020    3:41 PM 03/28/2020   11:19 AM 01/18/2020    5:41 PM 01/11/2020    2:23 PM 12/07/2016   11:21 AM  Advanced Directives  Does Patient Have a Medical Advance Directive? Yes Yes Yes Yes No;Yes No  Type of AParamedicof [place] will [place] will [place]Living will [place]Living will [place]ain CityLiving will   Does patient want to make changes to medical advance directive?  No - Patient declined  No - Patient declined    Copy of [place]in Chart? No - copy requested No - copy requested  No - copy  requested    Would patient like information on creating a medical advance directive?      No - Patient declined    Current Medications (verified) Outpatient Encounter Medications as of 02/12/2022  Medication Sig   albuterol (PROAIR HFA) 108 (90 Base) MCG/ACT inhaler Inhale 2 puffs into the lungs every 4 (four) hours as needed.   carboxymethylcellulose (REFRESH PLUS) 0.5 % SOLN 1 drop 3 (three) times daily as needed.   cetirizine (ZYRTEC) 10 MG tablet Take 10 mg by mouth at bedtime.    Cinnamon 500 MG capsule Take 500 mg by mouth daily.   diazepam (VALIUM) 5 MG tablet TAKE 1 TABLET BY MOUTH EVERY DAY AS NEEDED FOR ANXIETY   DULoxetine (CYMBALTA) 60 MG capsule Take 1 capsule (60 mg total) by mouth daily.   Fluticasone-Salmeterol (ADVAIR DISKUS) 250-50 MCG/DOSE AEPB INHALE 1 PUFF INTO LUNGS TWICE A DAY RINSE AFTER USE   Glucos-Chond-Hyal Ac-Ca Fructo (MOVE FREE JOINT HEALTH ADVANCE PO) Take 1 tablet by mouth daily.   latanoprost (XALATAN) 0.005 % ophthalmic solution Place 1 drop into both eyes at bedtime.   Magnesium 250 MG TABS Take 1 tablet by mouth daily.   montelukast (SINGULAIR) 10 MG tablet TAKE 1 TABLET BY MOUTH ONCE DAILY AT BEDTIME   Multiple Vitamins-Minerals (MULTIVITAMIN WOMEN 50+) TABS Take 1 tablet by mouth daily.   olmesartan (BENICAR) 20 MG tablet Take 1 tablet by mouth once daily   rosuvastatin (CRESTOR) 10 MG tablet Take 1 tablet by mouth once  daily   vitamin B-12 (CYANOCOBALAMIN) 500 MCG tablet Take 500 mcg by mouth daily.   Vitamin D, Ergocalciferol, (DRISDOL) 1.25 MG (50000 UNIT) CAPS capsule TAKE 1 CAPSULE BY MOUTH EVERY 10 DAYS.   No facility-administered encounter medications on file as of 02/12/2022.    Allergies (verified) Patient has no known allergies.   History: Past Medical History:  Diagnosis Date   Allergic rhinitis    Anxiety    Anxiety disorder 07/10/2015   Arthritis    Asthma    Asthma with bronchitis 04/19/2010   PFT 09/18/2010-FEV1  1.95/92; FEV1/FVC  0.76; FEF 25-75% improved 48% with bronchodilator; normal lung volumes; DLCO 78%     CAP (community acquired pneumonia) 03/20/2016   Depression    Essential hypertension 04/21/2010   Qualifier: Diagnosis of  By: Annamaria Boots MD, Clinton D    History of kidney stones    Hyperlipidemia 11/25/2017   Hypertension    Osteoarthritis of both knees 07/10/2015   Pneumonia    hx of    Seasonal and perennial allergic rhinitis 04/19/2010   Allergy Profile 04/19/2010-negative, IgE  3.8 Allergy skin test 11/07/2010-positive for grass, weed, tree pollens, dust mite    Severe obesity (BMI 35.0-39.9) with comorbidity ([place]) 01/11/2016   Vitamin D deficiency 07/10/2015   Past Surgical History:  Procedure Laterality Date   CHOLECYSTECTOMY     TONSILLECTOMY     TOTAL ABDOMINAL HYSTERECTOMY     TOTAL KNEE ARTHROPLASTY Right 01/18/2020   Procedure: TOTAL KNEE ARTHROPLASTY;  Surgeon: Paralee Cancel, MD;  Location: [facility];  Service: Orthopedics;  Laterality: Right;  70 mins   TOTAL KNEE ARTHROPLASTY Left 04/04/2020   Procedure: TOTAL KNEE ARTHROPLASTY;  Surgeon: Paralee Cancel, MD;  Location: [facility];  Service: Orthopedics;  Laterality: Left;  70 mins   Family History  Problem Relation Age of Onset   Dementia Mother    Multiple myeloma Mother    Lung cancer Father    Asthma Sister    Sleep apnea Sister    Heart disease Other    Allergic rhinitis Other    Cancer Other    Social History   Socioeconomic History   Marital status: Widowed    Spouse name: [name]   Number of children: 0   Years of education: College   Highest education level: Not on file  Occupational History   Occupation: retired  Tobacco Use   Smoking status: Never   Smokeless tobacco: Never  Vaping Use   Vaping Use: Never used  Substance and Sexual Activity   Alcohol use: No    Alcohol/week: 0.0 standard drinks of alcohol   Drug use: No   Sexual activity: Yes    Birth control/protection: Surgical  Other Topics Concern   Not on file   Social History Narrative   Patient lives at home with spouse.   Caffeine Use:    Social Determinants of Health   Financial Resource Strain: Low Risk  (02/12/2022)   Overall Financial Resource Strain (CARDIA)    Difficulty of Paying Living Expenses: Not hard at all  Food Insecurity: No Food Insecurity (02/12/2022)   Hunger Vital Sign    Worried About Running Out of Food in the Last Year: Never true    Ran Out of Food in the Last Year: Never true  Transportation Needs: No Transportation Needs (02/12/2022)   PRAPARE - Hydrologist (Medical): No    Lack of Transportation (Non-Medical): No  Physical Activity: Inactive (02/12/2022)  Exercise Vital Sign    Days of Exercise per Week: 0 days    Minutes of Exercise per Session: 0 min  Stress: No Stress Concern Present (02/12/2022)   [place]    Feeling of Stress : Not at all  Social Connections: Socially Isolated (02/12/2022)   Social Connection and Isolation Panel [NHANES]    Frequency of Communication with Friends and Family: More than three times a week    Frequency of Social Gatherings with Friends and Family: More than three times a week    Attends Religious Services: Never    Marine scientist or Organizations: No    Attends Archivist Meetings: Never    Marital Status: Widowed    Tobacco Counseling Counseling given: Not Answered   Clinical Intake:  Pre-visit preparation completed: Yes  Pain : No/denies pain     BMI - recorded: 40.92 Nutritional Status: BMI > 30  Obese Nutritional Risks: None Diabetes: No  How often do you need to have someone help you when you read instructions, pamphlets, or other written materials from your doctor or pharmacy?: 1 - Never  Diabetic? No  Interpreter Needed?: No  Information entered by :: Rise Paganini   Activities of Daily Living    02/12/2022    3:36 PM 02/08/2022    5:49 PM  In  your present state of health, do you have any difficulty performing the following activities:  Hearing? 0 0  Vision? 0 0  Difficulty concentrating or making decisions? 0 0  Walking or climbing stairs? 0 0  Dressing or bathing? 0 0  Doing errands, shopping? 0 0  Preparing Food and eating ? N N  Using the Toilet? N N  In the past six months, have you accidently leaked urine? Tempie Donning  Comment Wears pads. Followed by PCP   Do you have problems with loss of bowel control? N Y  Managing your Medications? N N  Managing your Finances? N N  Housekeeping or managing your Housekeeping? N N    Patient Care Team: [place], [name], MD as PCP - General (Family Medicine) Viona Gilmore, [facility] as Pharmacist (Pharmacist)  Indicate any recent Medical Services you may have received from other than [facility] providers in the past year (date may be approximate).     Assessment:   This is a routine wellness examination for Danna.  Hearing/Vision screen Hearing Screening - Comments:: Denies hearing difficulties   Vision Screening - Comments:: Wears rx glasses - up to date with routine eye exams with  Dr Celene Squibb  Dietary issues and exercise activities discussed: Exercise limited by: None identified   Goals Addressed               This Visit's Progress     Stay healthy (pt-stated)         Depression Screen    02/12/2022    3:30 PM 01/09/2022   10:24 AM 06/25/2021   11:17 AM 12/25/2020   10:40 AM 12/21/2019   10:52 AM 10/02/2017    5:57 PM  PHQ 2/9 Scores  PHQ - 2 Score 0 0 6 0 0 0  PHQ- 9 Score   23       Fall Risk    02/12/2022    3:37 PM 02/08/2022    5:49 PM 01/09/2022   10:24 AM 06/25/2021   11:17 AM 12/25/2020   10:41 AM  Fall Risk   Falls in the past  year? 0 0 0 0 0  Number falls in past yr: 0 0 0 0 0  Injury with Fall? 0 0 0 0 0  Risk for fall due to : No Fall Risks  Other (Comment) No Fall Risks   Follow up Falls prevention discussed  Falls evaluation completed Falls evaluation  completed Education provided    [place]:  Any stairs in or around the home? Yes  If so, are there any without handrails? No  Home free of loose throw rugs in walkways, pet beds, electrical cords, etc? Yes  Adequate lighting in your home to reduce risk of falls? Yes   ASSISTIVE DEVICES UTILIZED TO PREVENT FALLS:  Life alert? No  Use of a cane, walker or w/c? No  Grab bars in the bathroom? No  Shower chair or bench in shower? No  Elevated toilet seat or a handicapped toilet? No   TIMED UP AND GO:  Was the test performed? No . Audio Visit   Cognitive Function:        02/12/2022    3:38 PM  6CIT Screen  What Year? 0 points  What month? 0 points  What time? 0 points  Count back from 20 0 points  Months in reverse 0 points  Repeat phrase 0 points  Total Score 0 points    Immunizations Immunization History  Administered Date(s) Administered   COVID-19, mRNA, vaccine(Comirnaty)12 years and older 12/03/2021   Fluad Quad(high Dose 65+) 12/21/2019, 12/25/2020, 12/03/2021   Influenza Split 11/07/2010, 11/05/2011, 11/04/2012   Influenza, High Dose Seasonal PF 01/11/2016, 12/05/2016, 02/07/2018, 12/21/2018   Influenza,inj,quad, With Preservative 11/04/2016   Influenza-Unspecified 12/05/2016, 12/03/2021   PFIZER(Purple Top)SARS-COV-2 Vaccination 03/29/2019, 04/19/2019, 11/20/2019   Pneumococcal Conjugate-13 02/04/2013   Pneumococcal Polysaccharide-23 04/30/2012, 06/25/2021    TDAP status: Due, Education has been provided regarding the importance of this vaccine. Advised may receive this vaccine at local pharmacy or Health Dept. Aware to provide a copy of the vaccination record if obtained from local pharmacy or Health Dept. Verbalized acceptance and understanding.  Flu Vaccine status: Up to date  Pneumococcal vaccine status: Up to date  Covid-19 vaccine status: Completed vaccines  Qualifies for Shingles Vaccine? Yes   Zostavax completed  No   Shingrix Completed?: No.    Education has been provided regarding the importance of this vaccine. Patient has been advised to call insurance company to determine out of pocket expense if they have not yet received this vaccine. Advised may also receive vaccine at local pharmacy or Health Dept. Verbalized acceptance and understanding.  Screening Tests Health Maintenance  Topic Date Due   DTaP/Tdap/Td (1 - Tdap) Never done   Zoster Vaccines- Shingrix (1 of 2) 02/20/2026 (Originally [birth year]/1998)   Medicare Annual Wellness (AWV)  02/13/2023   MAMMOGRAM  06/08/2023   COLONOSCOPY (Pts 45-66yr Insurance coverage will need to be confirmed)  11/04/2028   Pneumonia Vaccine 72 Years old  Completed   INFLUENZA VACCINE  Completed   DEXA SCAN  Completed   COVID-19 Vaccine  Completed   Hepatitis C Screening  Completed   HPV VACCINES  Aged Out    Health Maintenance  Health Maintenance Due  Topic Date Due   DTaP/Tdap/Td (1 - Tdap) Never done    Colorectal cancer screening: Type of screening: Colonoscopy. Completed 11/05/18. Repeat every 10 years  Mammogram status: Completed 06/07/21. Repeat every year  Bone Density status: Ordered Scheduled for 04/30/22. Pt provided with contact info and advised to  call to schedule appt.  Lung Cancer Screening: (Low Dose CT Chest recommended if Age 89-80 years, 30 pack-year currently smoking OR have quit w/in 15years.) does not qualify.     Additional Screening:  Hepatitis C Screening: does qualify; Completed 06/25/21  Vision Screening: Recommended annual ophthalmology exams for early detection of glaucoma and other disorders of the eye. Is the patient up to date with their annual eye exam?  Yes  Who is the provider or what is the name of the office in which the patient attends annual eye exams? Dr Celene Squibb If pt is not established with a provider, would they like to be referred to a provider to establish care? No .   Dental Screening: Recommended annual  dental exams for proper oral hygiene  Community Resource Referral / Chronic Care Management:  CRR required this visit?  No   CCM required this visit?  No      Plan:     I have personally reviewed and noted the following in the patient's chart:   Medical and social history Use of alcohol, tobacco or illicit drugs  Current medications and supplements including opioid prescriptions. Patient is not currently taking opioid prescriptions. Functional ability and status Nutritional status Physical activity Advanced directives List of other physicians Hospitalizations, surgeries, and ER visits in previous 12 months Vitals Screenings to include cognitive, depression, and falls Referrals and appointments  In addition, I have reviewed and discussed with patient certain preventive protocols, quality metrics, and best practice recommendations. A written personalized care plan for preventive services as well as general preventive health recommendations were provided to patient.     Criselda Peaches, LPN   03/13/7410   Nurse Notes: None

## 2022-02-12 NOTE — Patient Instructions (Addendum)
Teresa Murphy , Thank you for taking time to come for your Medicare Wellness Visit. I appreciate your ongoing commitment to your health goals. Please review the following plan we discussed and let me know if I can assist you in the future.   These are the goals we discussed:  Goals       DIET - REDUCE CALORIE INTAKE      Exercise 3x per week (30 min per time)      10-15 min of walking at least 5 times per week as tolerated.      Stay healthy (pt-stated)      Track and Manage My Blood Pressure-Hypertension      Timeframe:  Short-Term Goal Priority:  Medium Start Date:                             Expected End Date:                       Follow Up Date 12/02/20    - check blood pressure weekly - choose a place to take my blood pressure (home, clinic or office, retail store) - write blood pressure results in a log or diary    Why is this important?   You won't feel high blood pressure, but it can still hurt your blood vessels.  High blood pressure can cause heart or kidney problems. It can also cause a stroke.  Making lifestyle changes like losing a little weight or eating less salt will help.  Checking your blood pressure at home and at different times of the day can help to control blood pressure.  If the doctor prescribes medicine remember to take it the way the doctor ordered.  Call the office if you cannot afford the medicine or if there are questions about it.     Notes:         This is a list of the screening recommended for you and due dates:  Health Maintenance  Topic Date Due   DTaP/Tdap/Td vaccine (1 - Tdap) Never done   Zoster (Shingles) Vaccine (1 of 2) 02/20/2026*   Medicare Annual Wellness Visit  02/13/2023   Mammogram  06/08/2023   Colon Cancer Screening  11/04/2028   Pneumonia Vaccine  Completed   Flu Shot  Completed   DEXA scan (bone density measurement)  Completed   COVID-19 Vaccine  Completed   Hepatitis C Screening: USPSTF Recommendation to screen - Ages  50-79 yo.  Completed   HPV Vaccine  Aged Out  *Topic was postponed. The date shown is not the original due date.    Advanced directives: Please bring a copy of your health care power of attorney and living will to the office to be added to your chart at your convenience.   Conditions/risks identified: None  Next appointment: Follow up in one year for your annual wellness visit     Preventive Care 65 Years and Older, Female Preventive care refers to lifestyle choices and visits with your health care provider that can promote health and wellness. What does preventive care include? A yearly physical exam. This is also called an annual well check. Dental exams once or twice a year. Routine eye exams. Ask your health care provider how often you should have your eyes checked. Personal lifestyle choices, including: Daily care of your teeth and gums. Regular physical activity. Eating a healthy diet. Avoiding tobacco and drug use. Limiting  alcohol use. Practicing safe sex. Taking low-dose aspirin every day. Taking vitamin and mineral supplements as recommended by your health care provider. What happens during an annual well check? The services and screenings done by your health care provider during your annual well check will depend on your age, overall health, lifestyle risk factors, and family history of disease. Counseling  Your health care provider may ask you questions about your: Alcohol use. Tobacco use. Drug use. Emotional well-being. Home and relationship well-being. Sexual activity. Eating habits. History of falls. Memory and ability to understand (cognition). Work and work Statistician. Reproductive health. Screening  You may have the following tests or measurements: Height, weight, and BMI. Blood pressure. Lipid and cholesterol levels. These may be checked every 5 years, or more frequently if you are over 47 years old. Skin check. Lung cancer screening. You may have  this screening every year starting at age 36 if you have a 30-pack-year history of smoking and currently smoke or have quit within the past 15 years. Fecal occult blood test (FOBT) of the stool. You may have this test every year starting at age 78. Flexible sigmoidoscopy or colonoscopy. You may have a sigmoidoscopy every 5 years or a colonoscopy every 10 years starting at age 85. Hepatitis C blood test. Hepatitis B blood test. Sexually transmitted disease (STD) testing. Diabetes screening. This is done by checking your blood sugar (glucose) after you have not eaten for a while (fasting). You may have this done every 1-3 years. Bone density scan. This is done to screen for osteoporosis. You may have this done starting at age 86. Mammogram. This may be done every 1-2 years. Talk to your health care provider about how often you should have regular mammograms. Talk with your health care provider about your test results, treatment options, and if necessary, the need for more tests. Vaccines  Your health care provider may recommend certain vaccines, such as: Influenza vaccine. This is recommended every year. Tetanus, diphtheria, and acellular pertussis (Tdap, Td) vaccine. You may need a Td booster every 10 years. Zoster vaccine. You may need this after age 33. Pneumococcal 13-valent conjugate (PCV13) vaccine. One dose is recommended after age 19. Pneumococcal polysaccharide (PPSV23) vaccine. One dose is recommended after age 31. Talk to your health care provider about which screenings and vaccines you need and how often you need them. This information is not intended to replace advice given to you by your health care provider. Make sure you discuss any questions you have with your health care provider. Document Released: 02/17/2015 Document Revised: 10/11/2015 Document Reviewed: 11/22/2014 Elsevier Interactive Patient Education  2017 [employer] Prevention in the Home Falls can cause  injuries. They can happen to people of all ages. There are many things you can do to make your home safe and to help prevent falls. What can I do on the outside of my home? Regularly fix the edges of walkways and driveways and fix any cracks. Remove anything that might make you trip as you walk through a door, such as a raised step or threshold. Trim any bushes or trees on the path to your home. Use bright outdoor lighting. Clear any walking paths of anything that might make someone trip, such as rocks or tools. Regularly check to see if handrails are loose or broken. Make sure that both sides of any steps have handrails. Any raised decks and porches should have guardrails on the edges. Have any leaves, snow, or ice cleared regularly. Use  sand or salt on walking paths during winter. Clean up any spills in your garage right away. This includes oil or grease spills. What can I do in the bathroom? Use night lights. Install grab bars by the toilet and in the tub and shower. Do not use towel bars as grab bars. Use non-skid mats or decals in the tub or shower. If you need to sit down in the shower, use a plastic, non-slip stool. Keep the floor dry. Clean up any water that spills on the floor as soon as it happens. Remove soap buildup in the tub or shower regularly. Attach bath mats securely with double-sided non-slip rug tape. Do not have throw rugs and other things on the floor that can make you trip. What can I do in the bedroom? Use night lights. Make sure that you have a light by your bed that is easy to reach. Do not use any sheets or blankets that are too big for your bed. They should not hang down onto the floor. Have a firm chair that has side arms. You can use this for support while you get dressed. Do not have throw rugs and other things on the floor that can make you trip. What can I do in the kitchen? Clean up any spills right away. Avoid walking on wet floors. Keep items that you  use a lot in easy-to-reach places. If you need to reach something above you, use a strong step stool that has a grab bar. Keep electrical cords out of the way. Do not use floor polish or wax that makes floors slippery. If you must use wax, use non-skid floor wax. Do not have throw rugs and other things on the floor that can make you trip. What can I do with my stairs? Do not leave any items on the stairs. Make sure that there are handrails on both sides of the stairs and use them. Fix handrails that are broken or loose. Make sure that handrails are as long as the stairways. Check any carpeting to make sure that it is firmly attached to the stairs. Fix any carpet that is loose or worn. Avoid having throw rugs at the top or bottom of the stairs. If you do have throw rugs, attach them to the floor with carpet tape. Make sure that you have a light switch at the top of the stairs and the bottom of the stairs. If you do not have them, ask someone to add them for you. What else can I do to help prevent falls? Wear shoes that: Do not have high heels. Have rubber bottoms. Are comfortable and fit you well. Are closed at the toe. Do not wear sandals. If you use a stepladder: Make sure that it is fully opened. Do not climb a closed stepladder. Make sure that both sides of the stepladder are locked into place. Ask someone to hold it for you, if possible. Clearly mark and make sure that you can see: Any grab bars or handrails. First and last steps. Where the edge of each step is. Use tools that help you move around (mobility aids) if they are needed. These include: Canes. Walkers. Scooters. Crutches. Turn on the lights when you go into a dark area. Replace any light bulbs as soon as they burn out. Set up your furniture so you have a clear path. Avoid moving your furniture around. If any of your floors are uneven, fix them. If there are any pets around you, be aware  of where they are. Review your  medicines with your doctor. Some medicines can make you feel dizzy. This can increase your chance of falling. Ask your doctor what other things that you can do to help prevent falls. This information is not intended to replace advice given to you by your health care provider. Make sure you discuss any questions you have with your health care provider. Document Released: 11/17/2008 Document Revised: 06/29/2015 Document Reviewed: 02/25/2014 Elsevier Interactive Patient Education  2017 [employer].

## 2022-04-30 ENCOUNTER — Ambulatory Visit
Admission: RE | Admit: 2022-04-30 | Discharge: 2022-04-30 | Disposition: A | Discharge status: Home / Self Care | Payer: Medicare Other | Source: Ambulatory Visit | Attending: Family Medicine | Admitting: Family Medicine

## 2022-04-30 DIAGNOSIS — E2839 Other primary ovarian failure: Secondary | ICD-10-CM

## 2022-05-05 ENCOUNTER — Encounter: Payer: Self-pay | Admitting: Family Medicine

## 2022-05-05 DIAGNOSIS — M810 Age-related osteoporosis without current pathological fracture: Secondary | ICD-10-CM | POA: Insufficient documentation

## 2022-05-06 ENCOUNTER — Other Ambulatory Visit: Payer: Self-pay | Admitting: Family Medicine

## 2022-05-06 DIAGNOSIS — I10 Essential (primary) hypertension: Secondary | ICD-10-CM

## 2022-05-07 ENCOUNTER — Telehealth: Payer: Self-pay | Admitting: Family Medicine

## 2022-05-07 ENCOUNTER — Other Ambulatory Visit: Payer: Self-pay

## 2022-05-07 MED ORDER — ALENDRONATE SODIUM 70 MG PO TABS: 70.0000 mg | ORAL_TABLET | ORAL | 3 refills | Status: DC

## 2022-05-07 NOTE — Telephone Encounter (Signed)
Pt call back about her bone density report and will wait for a call back.

## 2022-05-07 NOTE — Telephone Encounter (Signed)
See result note.  

## 2022-05-13 ENCOUNTER — Other Ambulatory Visit: Payer: Self-pay | Admitting: *Deleted

## 2022-05-13 MED ORDER — MONTELUKAST SODIUM 10 MG PO TABS: 10.0000 mg | ORAL_TABLET | Freq: Every day | ORAL | 12 refills | Status: DC

## 2022-06-08 ENCOUNTER — Other Ambulatory Visit: Payer: Self-pay | Admitting: Family Medicine

## 2023-01-01 ENCOUNTER — Other Ambulatory Visit: Payer: Self-pay | Admitting: Family Medicine

## 2023-01-01 DIAGNOSIS — Z1231 Encounter for screening mammogram for malignant neoplasm of breast: Secondary | ICD-10-CM

## 2023-01-08 NOTE — Progress Notes (Signed)
Patient ID: Teresa Murphy, female    DOB: 1950-11-02, 72 y.o.   MRN: [id number]  HPI female nonsmoker followed for Asthma, Allergic rhinitis, complicated by HBP Allergy profile 04/19/10- neg, IGe 3.8 She has new HVAC at home and new moisture barrier.  PFT-09/18/10- mild obstruction with response to BD. FEV1 1.95/ 92%; FEV1/FVC 0.76; FEF25-75 improved 48% w/ bronchodilator Skin test: Allergy skin tests show significant positives particularly for grass, tree and dust mite.  ---------------------------------------------------------------------------------------   01/08/22- - 72 year old female never smoker followed for Asthma, Allergic rhinitis, complicated by HTN, Glaucoma, Obesity, Osteoarthritis,  -Singulair, Advair 250, ProAir hfa, Zyrtec,  Covid vax-4 Phizer Flu vax-had -----Pt is doing well ACT score- 20 Reports doing well with current meds. Needs Proair refilled. No significant flare. No cardiac issues.  Mild nasal drainage- Zyrtec sufficient.  01/09/23- 72 year old female never smoker followed for Asthma, Allergic rhinitis, complicated by HTN, Glaucoma, Obesity, Osteoarthritis,  -Singulair, Advair 250, ProAir hfa, Zyrtec,  Discussed the use of AI scribe software for clinical note transcription with the patient, who gave verbal consent to proceed.   History of Present Illness   The patient, a 72 year old female with a history of asthma, allergic rhinitis, hypertension, glaucoma, obesity, and osteoarthritis, presents for a routine follow-up. The patient's primary concerns are related to her asthma and allergic rhinitis. She reports a chronic cough and shortness of breath, which have been worse over the past several months. The patient describes these symptoms as a chronic issue, which worsens with exertion. She also reports a history of exercise intolerance dating back to high school, which she attributes to her underlying asthma.  Earlier this year, the patient experienced a  significant cough and drainage, which she describes as "galloping consumption." This episode occurred while she was at [employer], and both she and her sister, who was with her at the time, experienced similar symptoms. She sought care at a local clinic, where she received treatment and eventually recovered.  The patient also reports issues with her vision, specifically with focusing. She has a history of glaucoma and has had cataract surgery in the past. She has an upcoming appointment with her ophthalmologist to address these concerns.          Review of Systems- see HPI   + = positive Constitutional:   No weight loss, night sweats,  Fevers, chills, fatigue, lassitude. HEENT:   No- headaches,  Difficulty swallowing,  Tooth/dental problems,  Sore throat,  CV:  No chest pain,  Orthopnea, PND, swelling in lower extremities, anasarca, dizziness, palpitations GI  No heartburn, indigestion, abdominal pain, nausea, vomiting,  Resp: No shortness of breath with exertion or at rest.  No excess mucus, no productive cough,  No non-productive cough,  No coughing up of blood.  No change in color of mucus. wheezing-none.  Skin: no rash or lesions. GU:  MS:  No joint pain or swelling.  No decreased range of motion.  No back pain. Psych:  No change in mood or affect. No depression or anxiety.  No memory loss.  Objective:   Physical Exam General- Alert, Oriented, Affect-appropriate, Distress- none acute. + Overweight Skin- rash-none, lesions- none, excoriation- none, + lipoma L shoulder Lymphadenopathy- none Head- atraumatic            Eyes- Gross vision intact, PERRLA, conjunctivae clear secretions            Ears- Hearing, canals normal            Nose- nose  clear, no-Septal dev, no-polyps, erosion, perforation             Throat- Mallampati III , mucosa clear , drainage- none, tonsils- atrophic,  Neck- flexible , trachea midline, no stridor , thyroid nl, carotid no bruit Chest - symmetrical excursion  , unlabored           Heart/CV- RRR , no murmur , no gallop  , no rub, nl s1 s2                           - JVD- none , edema- none, stasis changes- none, varices- none           Lung- clear to P&A, wheeze- none,  cough-none, dullness-none, rub- none           Chest wall- + lipoma L shoulder Abd-  Br/ Gen/ Rectal- Not done, not indicated Extrem- cyanosis- none, clubbing, none, atrophy- none, strength- nl, TKR scars- healing well Neuro- grossly intact to observation      Assessment and Plan    Asthma Mild intermittent asthma with occasional use of rescue inhaler. No recent exacerbations. Currently managed with Singulair, Advair 250, and ProAir HFA. -Continue current medications.  Allergic Rhinitis Controlled with Zyrtec. -Continue Zyrtec as needed.  Glaucoma Recent issues with vision and pressure sensation behind right eye. Latanoprost discontinued and using refresh drops. Follow-up appointment scheduled with ophthalmology on January 28th. -Continue refresh drops as directed by ophthalmology.  No changes in other chronic conditions (HTN, obesity, osteoarthritis) reported. No medication refills needed at this time.

## 2023-01-09 ENCOUNTER — Encounter: Payer: Self-pay | Admitting: Internal Medicine

## 2023-01-09 ENCOUNTER — Ambulatory Visit: Payer: Medicare Other | Admitting: Internal Medicine

## 2023-01-09 VITALS — BP 134/64 | HR 91 | Temp 98.1°F | Ht 62.0 in | Wt 234.0 lb

## 2023-01-09 DIAGNOSIS — J45909 Unspecified asthma, uncomplicated: Secondary | ICD-10-CM | POA: Diagnosis not present

## 2023-01-09 DIAGNOSIS — J302 Other seasonal allergic rhinitis: Secondary | ICD-10-CM

## 2023-01-09 NOTE — Patient Instructions (Signed)
You are doing well!- We can continue current meds. Please call if we can help.

## 2023-01-10 ENCOUNTER — Encounter: Payer: Self-pay | Admitting: Internal Medicine

## 2023-01-10 NOTE — Assessment & Plan Note (Signed)
Mild persistent uncomplicated Plan- continue current meds

## 2023-01-10 NOTE — Assessment & Plan Note (Signed)
Continuing current meds.

## 2023-01-13 NOTE — Progress Notes (Signed)
HPI: Teresa Murphy is a 72 y.o. female with a PMHx significant for asthma, HLD, anxiety, vitamin D deficiency, HTN, and OA, who is here today for her routine physical.  Last CPE: 01/09/2022  Exercise: Diet:  Sleep:  Alcohol Use:  Smoking: Vision:  Dental:  Immunization History  Administered Date(s) Administered   Fluad Quad(high Dose 65+) 12/21/2019, 12/25/2020, 12/03/2021   Influenza Split 11/07/2010, 11/05/2011, 11/04/2012   Influenza, High Dose Seasonal PF 01/11/2016, 12/05/2016, 02/07/2018, 12/21/2018   Influenza,inj,quad, With Preservative 11/04/2016   Influenza-Unspecified 12/05/2016, 12/03/2021   PFIZER(Purple Top)SARS-COV-2 Vaccination 03/29/2019, 04/19/2019, 11/20/2019   Pfizer(Comirnaty)Fall Seasonal Vaccine 12 years and older 12/03/2021   Pneumococcal Conjugate-13 02/04/2013   Pneumococcal Polysaccharide-23 04/30/2012, 06/25/2021   Health Maintenance  Topic Date Due   DTaP/Tdap/Td (1 - Tdap) Never done   INFLUENZA VACCINE  09/05/2022   COVID-19 Vaccine (5 - 2023-24 season) 10/06/2022   Medicare Annual Wellness (AWV)  02/13/2023   Zoster Vaccines- Shingrix (1 of 2) 02/20/2026 (Originally 03/13/1998)   MAMMOGRAM  06/08/2023   Colonoscopy  11/04/2028   Pneumonia Vaccine 49+ Years old  Completed   DEXA SCAN  Completed   Hepatitis C Screening  Completed   HPV VACCINES  Aged Out   Chronic medical problems: ***  Concerns today: ***  Review of Systems  Current Outpatient Medications on File Prior to Visit  Medication Sig Dispense Refill   albuterol (PROAIR HFA) 108 (90 Base) MCG/ACT inhaler Inhale 2 puffs into the lungs every 4 (four) hours as needed. 1 each 12   alendronate (FOSAMAX) 70 MG tablet Take 1 tablet (70 mg total) by mouth every 7 (seven) days. Take with a full glass of water on an empty stomach. 12 tablet 3   carboxymethylcellulose (REFRESH PLUS) 0.5 % SOLN 1 drop 3 (three) times daily as needed.     cetirizine (ZYRTEC) 10 MG tablet Take 10 mg  by mouth at bedtime.      Cinnamon 500 MG capsule Take 500 mg by mouth daily.     diazepam (VALIUM) 5 MG tablet TAKE 1 TABLET BY MOUTH EVERY DAY AS NEEDED FOR ANXIETY 30 tablet 2   DULoxetine (CYMBALTA) 60 MG capsule Take 1 capsule (60 mg total) by mouth daily. 90 capsule 3   Fluticasone-Salmeterol (ADVAIR DISKUS) 250-50 MCG/DOSE AEPB INHALE 1 PUFF INTO LUNGS TWICE A DAY RINSE AFTER USE 3 each 0   Glucos-Chond-Hyal Ac-Ca Fructo (MOVE FREE JOINT HEALTH ADVANCE PO) Take 1 tablet by mouth daily.     latanoprost (XALATAN) 0.005 % ophthalmic solution Place 1 drop into both eyes at bedtime. (Patient not taking: Reported on 01/09/2023)  11   Magnesium 250 MG TABS Take 1 tablet by mouth daily.     montelukast (SINGULAIR) 10 MG tablet Take 1 tablet (10 mg total) by mouth at bedtime. 30 tablet 12   Multiple Vitamins-Minerals (MULTIVITAMIN WOMEN 50+) TABS Take 1 tablet by mouth daily.     olmesartan (BENICAR) 20 MG tablet Take 1 tablet by mouth once daily 90 tablet 2   rosuvastatin (CRESTOR) 10 MG tablet Take 1 tablet by mouth once daily 90 tablet 3   vitamin B-12 (CYANOCOBALAMIN) 500 MCG tablet Take 500 mcg by mouth daily.     Vitamin D, Ergocalciferol, (DRISDOL) 1.25 MG (50000 UNIT) CAPS capsule TAKE 1 CAPSULE BY MOUTH EVERY 10 DAYS. 7 capsule 2   No current facility-administered medications on file prior to visit.    Past Medical History:  Diagnosis Date   Allergic rhinitis  Anxiety    Anxiety disorder 07/10/2015   Arthritis    Asthma    Asthma with bronchitis 04/19/2010   PFT 09/18/2010-FEV1  1.95/92; FEV1/FVC 0.76; FEF 25-75% improved 48% with bronchodilator; normal lung volumes; DLCO 78%     CAP (community acquired pneumonia) 03/20/2016   Depression    Essential hypertension 04/21/2010   Qualifier: Diagnosis of  By: Maple Hudson MD, Clinton D    History of kidney stones    Hyperlipidemia 11/25/2017   Hypertension    Osteoarthritis of both knees 07/10/2015   Pneumonia    hx of    Seasonal and  perennial allergic rhinitis 04/19/2010   Allergy Profile 04/19/2010-negative, IgE  3.8 Allergy skin test 11/07/2010-positive for grass, weed, tree pollens, dust mite    Severe obesity (BMI 35.0-39.9) with comorbidity (HCC) 01/11/2016   Vitamin D deficiency 07/10/2015    Past Surgical History:  Procedure Laterality Date   CHOLECYSTECTOMY     TONSILLECTOMY     TOTAL ABDOMINAL HYSTERECTOMY     TOTAL KNEE ARTHROPLASTY Right 01/18/2020   Procedure: TOTAL KNEE ARTHROPLASTY;  Surgeon: Durene Romans, MD;  Location: [facility];  Service: Orthopedics;  Laterality: Right;  70 mins   TOTAL KNEE ARTHROPLASTY Left 04/04/2020   Procedure: TOTAL KNEE ARTHROPLASTY;  Surgeon: Durene Romans, MD;  Location: [facility];  Service: Orthopedics;  Laterality: Left;  70 mins    No Known Allergies  Family History  Problem Relation Age of Onset   Dementia Mother    Multiple myeloma Mother    Lung cancer Father    Asthma Sister    Sleep apnea Sister    Heart disease Other    Allergic rhinitis Other    Cancer Other     Social History   Socioeconomic History   Marital status: Widowed    Spouse name: Chrissie Noa   Number of children: 0   Years of education: [employer] education level: Some college, no degree  Occupational History   Occupation: retired  Tobacco Use   Smoking status: Never   Smokeless tobacco: Never  Vaping Use   Vaping status: Never Used  Substance and Sexual Activity   Alcohol use: No    Alcohol/week: 0.0 standard drinks of alcohol   Drug use: No   Sexual activity: Yes    Birth control/protection: Surgical  Other Topics Concern   Not on file  Social History Narrative   Patient lives at home with spouse.   Caffeine Use:    Social Determinants of Health   Financial Resource Strain: Low Risk  (01/10/2023)   Overall Financial Resource Strain (CARDIA)    Difficulty of Paying Living Expenses: Not hard at all  Food Insecurity: No Food Insecurity (01/10/2023)   Hunger Vital Sign     Worried About Running Out of Food in the Last Year: Never true    Ran Out of Food in the Last Year: Never true  Transportation Needs: No Transportation Needs (01/10/2023)   PRAPARE - Administrator, Civil Service (Medical): No    Lack of Transportation (Non-Medical): No  Physical Activity: Inactive (01/10/2023)   Exercise Vital Sign    Days of Exercise per Week: 0 days    Minutes of Exercise per Session: 0 min  Stress: No Stress Concern Present (01/10/2023)   [employer] of Occupational Health - Occupational Stress Questionnaire    Feeling of Stress : Only a little  Social Connections: Moderately Isolated (01/10/2023)   Social Connection and Isolation Panel [  NHANES]    Frequency of Communication with Friends and Family: More than three times a week    Frequency of Social Gatherings with Friends and Family: More than three times a week    Attends Religious Services: 1 to 4 times per year    Active Member of [employer] or Organizations: No    Attends Banker Meetings: Never    Marital Status: Widowed    There were no vitals filed for this visit. There is no height or weight on file to calculate BMI.  Wt Readings from Last 3 Encounters:  01/09/23 234 lb (106.1 kg)  02/12/22 231 lb (104.8 kg)  01/09/22 231 lb 8 oz (105 kg)    Physical Exam  ASSESSMENT AND PLAN:  Ms. BERNELDA BYNOG was here today for her annual physical examination.  No orders of the defined types were placed in this encounter.   @ASSESSPLAN @  There are no diagnoses linked to this encounter.  No follow-ups on file.  I, Suanne Marker, acting as a scribe for Vanda Waskey [place], MD., have documented all relevant documentation on the behalf of Oluwatamilore Starnes [place], MD, as directed by  Lavaya Defreitas [place], MD while in the presence of Jaycee Mckellips [place], MD.   I, Suanne Marker, have reviewed all documentation for this visit. The documentation on 01/13/23 for the exam, diagnosis, procedures, and orders are all  accurate and complete.  Giavanni Odonovan G. [place], MD  [facility]. [place] office.

## 2023-01-14 ENCOUNTER — Ambulatory Visit: Payer: Medicare Other | Admitting: Family Medicine

## 2023-01-14 ENCOUNTER — Encounter: Payer: Self-pay | Admitting: Family Medicine

## 2023-01-14 VITALS — BP 128/80 | HR 94 | Temp 98.3°F | Resp 16 | Ht 62.0 in | Wt 234.5 lb

## 2023-01-14 DIAGNOSIS — F411 Generalized anxiety disorder: Secondary | ICD-10-CM

## 2023-01-14 DIAGNOSIS — E559 Vitamin D deficiency, unspecified: Secondary | ICD-10-CM | POA: Diagnosis not present

## 2023-01-14 DIAGNOSIS — I10 Essential (primary) hypertension: Secondary | ICD-10-CM | POA: Diagnosis not present

## 2023-01-14 DIAGNOSIS — E785 Hyperlipidemia, unspecified: Secondary | ICD-10-CM | POA: Diagnosis not present

## 2023-01-14 DIAGNOSIS — Z0000 Encounter for general adult medical examination without abnormal findings: Secondary | ICD-10-CM

## 2023-01-14 LAB — COMPREHENSIVE METABOLIC PANEL
ALT: 16 U/L (ref 0–35)
AST: 22 U/L (ref 0–37)
Albumin: 4.6 g/dL (ref 3.5–5.2)
Alkaline Phosphatase: 84 U/L (ref 39–117)
BUN: 18 mg/dL (ref 6–23)
CO2: 30 meq/L (ref 19–32)
Calcium: 9.1 mg/dL (ref 8.4–10.5)
Chloride: 104 meq/L (ref 96–112)
Creatinine, Ser: 0.75 mg/dL (ref 0.40–1.20)
GFR: 78.2 mL/min (ref >60.00)
Glucose, Bld: 106 mg/dL — ABNORMAL HIGH (ref 70–99)
Potassium: 3.8 meq/L (ref 3.5–5.1)
Sodium: 143 meq/L (ref 135–145)
Total Bilirubin: 0.7 mg/dL (ref 0.2–1.2)
Total Protein: 7.3 g/dL (ref 6.0–8.3)

## 2023-01-14 LAB — LIPID PANEL
Cholesterol: 160 mg/dL (ref 0–200)
HDL: 48.1 mg/dL (ref >39.00)
LDL Cholesterol: 93 mg/dL (ref 0–99)
NonHDL: 112.3
Total CHOL/HDL Ratio: 3
Triglycerides: 97 mg/dL (ref 0.0–149.0)
VLDL: 19.4 mg/dL (ref 0.0–40.0)

## 2023-01-14 LAB — VITAMIN D 25 HYDROXY (VIT D DEFICIENCY, FRACTURES): VITD: 31.9 ng/mL (ref 30.00–100.00)

## 2023-01-14 NOTE — Patient Instructions (Addendum)
A few things to remember from today's visit:  Routine general medical examination at a health care facility  Essential hypertension  Hyperlipidemia, unspecified hyperlipidemia type - Plan: Comprehensive metabolic panel, Lipid panel  Generalized anxiety disorder  Vitamin D deficiency, unspecified - Plan: VITAMIN D 25 Hydroxy (Vit-D Deficiency, Fractures)  If you need refills for medications you take chronically, please call your pharmacy. Do not use My Chart to request refills or for acute issues that need immediate attention. If you send a my chart message, it may take a few days to be addressed, specially if I am not in the office.  Please be sure medication list is accurate. If a new problem present, please set up appointment sooner than planned today.

## 2023-01-14 NOTE — Assessment & Plan Note (Signed)
Problem has improved. She is not taking Valium frequently. For now continue Cymbalta 60 mg daily. We can consider starting decreasing/weaning of Cymbalta next visit if problem is still well controlled.

## 2023-01-14 NOTE — Assessment & Plan Note (Addendum)
BP adequately controlled. Continue Benicar 20 mg daily as well as low-salt diet. Recommend to monitor BP at home regularly. Eye exam is current. As far as problem is stable, annual follow-ups can be continued.

## 2023-01-14 NOTE — Assessment & Plan Note (Signed)
We discussed the importance of regular physical activity and healthy diet for prevention of chronic illness and/or complications. Preventive guidelines reviewed. Vaccination up to date. Colonoscopy is due in 11/2023, last one was done at [facility]. Ca++ and vit D supplementation to continue. Next CPE in a year.

## 2023-01-14 NOTE — Assessment & Plan Note (Signed)
She understands the benefits of wt loss as well as adverse effects of obesity. Consistency with healthy diet and physical activity encouraged. Making positive life style changes, small steps at the time, will provide long lasting results with minimal risk for adverse effects; so I do not recommend pharmacologic treatment. We discussed the options of establishing with [facility] weight and wellness clinic, we decided to hold on this for now.

## 2023-01-14 NOTE — Assessment & Plan Note (Signed)
Continue rosuvastatin 10 mg daily as well as low-fat diet. Further recommendation will be given according to lipid panel result.

## 2023-01-15 ENCOUNTER — Ambulatory Visit
Admission: RE | Admit: 2023-01-15 | Discharge: 2023-01-15 | Disposition: A | Discharge status: Home / Self Care | Payer: Medicare Other | Source: Ambulatory Visit | Attending: Family Medicine | Admitting: Family Medicine

## 2023-01-15 DIAGNOSIS — Z1231 Encounter for screening mammogram for malignant neoplasm of breast: Secondary | ICD-10-CM

## 2023-01-26 ENCOUNTER — Other Ambulatory Visit: Payer: Self-pay | Admitting: Family Medicine

## 2023-01-26 DIAGNOSIS — E559 Vitamin D deficiency, unspecified: Secondary | ICD-10-CM

## 2023-02-21 ENCOUNTER — Ambulatory Visit (INDEPENDENT_AMBULATORY_CARE_PROVIDER_SITE_OTHER): Payer: Medicare Other

## 2023-02-21 VITALS — Ht 62.0 in | Wt 234.0 lb

## 2023-02-21 DIAGNOSIS — Z0000 Encounter for general adult medical examination without abnormal findings: Secondary | ICD-10-CM

## 2023-02-21 NOTE — Progress Notes (Signed)
Subjective:   Teresa Murphy is a 73 y.o. female who presents for Medicare Annual (Subsequent) preventive examination.  Visit Complete: Virtual I connected with  Rosemary Holms on 02/21/23 by a audio enabled telemedicine application and verified that I am speaking with the correct person using two identifiers.  Patient Location: Home  Provider Location: Home Office  I discussed the limitations of evaluation and management by telemedicine. The patient expressed understanding and agreed to proceed.  Vital Signs: Because this visit was a virtual/telehealth visit, some criteria may be missing or patient reported. Any vitals not documented were not able to be obtained and vitals that have been documented are patient reported.  Patient Medicare AWV questionnaire was completed by the patient on 02/20/23; I have confirmed that all information answered by patient is correct and no changes since this date.  Cardiac Risk Factors include: hypertension     Objective:    Today's Vitals   02/21/23 1005  Weight: 234 lb (106.1 kg)  Height: 5\' 2"  (1.575 m)   Body mass index is 42.8 kg/m.     02/21/2023   10:16 AM 02/12/2022    3:38 PM 04/04/2020    3:41 PM 03/28/2020   11:19 AM 01/18/2020    5:41 PM 01/11/2020    2:23 PM 12/07/2016   11:21 AM  Advanced Directives  Does Patient Have a Medical Advance Directive? Yes Yes Yes Yes Yes No;Yes No  Type of Estate agent of [place];Living will Healthcare Power of [place];Living will Healthcare Power of [place];Living will Healthcare Power of [place];Living will Healthcare Power of [place];Living will Healthcare Power of [place];Living will   Does patient want to make changes to medical advance directive?   No - Patient declined  No - Patient declined    Copy of Healthcare Power of Attorney in Chart? No - copy requested No - copy requested No - copy requested  No - copy requested    Would patient like information on creating a  medical advance directive?       No - Patient declined    Current Medications (verified) Outpatient Encounter Medications as of 02/21/2023  Medication Sig   albuterol (PROAIR HFA) 108 (90 Base) MCG/ACT inhaler Inhale 2 puffs into the lungs every 4 (four) hours as needed.   alendronate (FOSAMAX) 70 MG tablet Take 1 tablet (70 mg total) by mouth every 7 (seven) days. Take with a full glass of water on an empty stomach.   carboxymethylcellulose (REFRESH PLUS) 0.5 % SOLN 1 drop 3 (three) times daily as needed.   cetirizine (ZYRTEC) 10 MG tablet Take 10 mg by mouth at bedtime.    Cinnamon 500 MG capsule Take 500 mg by mouth daily.   diazepam (VALIUM) 5 MG tablet TAKE 1 TABLET BY MOUTH EVERY DAY AS NEEDED FOR ANXIETY   DULoxetine (CYMBALTA) 60 MG capsule Take 1 capsule (60 mg total) by mouth daily.   Fluticasone-Salmeterol (ADVAIR DISKUS) 250-50 MCG/DOSE AEPB INHALE 1 PUFF INTO LUNGS TWICE A DAY RINSE AFTER USE   Glucos-Chond-Hyal Ac-Ca Fructo (MOVE FREE JOINT HEALTH ADVANCE PO) Take 1 tablet by mouth daily.   Magnesium 250 MG TABS Take 1 tablet by mouth daily.   montelukast (SINGULAIR) 10 MG tablet Take 1 tablet (10 mg total) by mouth at bedtime.   Multiple Vitamins-Minerals (MULTIVITAMIN WOMEN 50+) TABS Take 1 tablet by mouth daily.   olmesartan (BENICAR) 20 MG tablet Take 1 tablet by mouth once daily   rosuvastatin (CRESTOR) 10 MG  tablet Take 1 tablet by mouth once daily   vitamin B-12 (CYANOCOBALAMIN) 500 MCG tablet Take 500 mcg by mouth daily.   Vitamin D, Ergocalciferol, (DRISDOL) 1.25 MG (50000 UNIT) CAPS capsule TAKE 1 CAPSULE BY MOUTH EVERY 10 DAYS   [DISCONTINUED] latanoprost (XALATAN) 0.005 % ophthalmic solution Place 1 drop into both eyes at bedtime.   No facility-administered encounter medications on file as of 02/21/2023.    Allergies (verified) Patient has no known allergies.   History: Past Medical History:  Diagnosis Date   Allergic rhinitis    Anxiety    Anxiety disorder  07/10/2015   Arthritis    Asthma    Asthma with bronchitis 04/19/2010   PFT 09/18/2010-FEV1  1.95/92; FEV1/FVC 0.76; FEF 25-75% improved 48% with bronchodilator; normal lung volumes; DLCO 78%     CAP (community acquired pneumonia) 03/20/2016   Depression    Essential hypertension 04/21/2010   Qualifier: Diagnosis of  By: Maple Hudson MD, [place] D    History of kidney stones    Hyperlipidemia 11/25/2017   Hypertension    Osteoarthritis of both knees 07/10/2015   Pneumonia    hx of    Seasonal and perennial allergic rhinitis 04/19/2010   Allergy Profile 04/19/2010-negative, IgE  3.8 Allergy skin test 11/07/2010-positive for grass, weed, tree pollens, dust mite    Severe obesity (BMI 35.0-39.9) with comorbidity (HCC) 01/11/2016   Vitamin D deficiency 07/10/2015   Past Surgical History:  Procedure Laterality Date   CHOLECYSTECTOMY     TONSILLECTOMY     TOTAL ABDOMINAL HYSTERECTOMY     TOTAL KNEE ARTHROPLASTY Right 01/18/2020   Procedure: TOTAL KNEE ARTHROPLASTY;  Surgeon: Durene Romans, MD;  Location: [facility];  Service: Orthopedics;  Laterality: Right;  70 mins   TOTAL KNEE ARTHROPLASTY Left 04/04/2020   Procedure: TOTAL KNEE ARTHROPLASTY;  Surgeon: Durene Romans, MD;  Location: [facility];  Service: Orthopedics;  Laterality: Left;  70 mins   Family History  Problem Relation Age of Onset   Dementia Mother    Multiple myeloma Mother    Lung cancer Father    Asthma Sister    Sleep apnea Sister    Heart disease Other    Allergic rhinitis Other    Cancer Other    Social History   Socioeconomic History   Marital status: Widowed    Spouse name: Chrissie Noa   Number of children: 0   Years of education: [employer] education level: Some college, no degree  Occupational History   Occupation: retired  Tobacco Use   Smoking status: Never   Smokeless tobacco: Never  Vaping Use   Vaping status: Never Used  Substance and Sexual Activity   Alcohol use: No    Alcohol/week: 0.0 standard drinks of  alcohol   Drug use: No   Sexual activity: Yes    Birth control/protection: Surgical  Other Topics Concern   Not on file  Social History Narrative   Patient lives at home with spouse.   Caffeine Use:    Social Drivers of Corporate investment banker Strain: Low Risk  (02/21/2023)   Overall Financial Resource Strain (CARDIA)    Difficulty of Paying Living Expenses: Not hard at all  Food Insecurity: No Food Insecurity (02/21/2023)   Hunger Vital Sign    Worried About Running Out of Food in the Last Year: Never true    Ran Out of Food in the Last Year: Never true  Transportation Needs: No Transportation Needs (02/21/2023)   PRAPARE -  Administrator, Civil Service (Medical): No    Lack of Transportation (Non-Medical): No  Physical Activity: Inactive (02/21/2023)   Exercise Vital Sign    Days of Exercise per Week: 0 days    Minutes of Exercise per Session: 0 min  Stress: No Stress Concern Present (02/21/2023)   [employer] of Occupational Health - Occupational Stress Questionnaire    Feeling of Stress : Not at all  Social Connections: Socially Isolated (02/21/2023)   Social Connection and Isolation Panel [NHANES]    Frequency of Communication with Friends and Family: More than three times a week    Frequency of Social Gatherings with Friends and Family: More than three times a week    Attends Religious Services: Never    Database administrator or Organizations: No    Attends Banker Meetings: Never    Marital Status: Widowed    Tobacco Counseling Counseling given: Not Answered   Clinical Intake:  Pre-visit preparation completed: Yes  Pain : No/denies pain     BMI - recorded: 42.8 Nutritional Status: BMI > 30  Obese Nutritional Risks: None Diabetes: No  How often do you need to have someone help you when you read instructions, pamphlets, or other written materials from your doctor or pharmacy?: 1 - Never  Interpreter Needed?:  No  Information entered by :: Theresa Mulligan LPN   Activities of Daily Living    02/21/2023   10:11 AM 02/20/2023    3:09 PM  In your present state of health, do you have any difficulty performing the following activities:  Hearing? 0 0  Vision? 0 0  Difficulty concentrating or making decisions? 1 1  Comment Followed by PCP   Walking or climbing stairs? 0 0  Dressing or bathing? 0 0  Doing errands, shopping? 0 0  Preparing Food and eating ? N N  Using the Toilet? N N  In the past six months, have you accidently leaked urine? N Y  Do you have problems with loss of bowel control? N N  Managing your Medications? N N  Managing your Finances? N N  Housekeeping or managing your Housekeeping? N N    Patient Care Team: [place], [name], MD as PCP - General (Family Medicine) Verner Chol, [facility] (Inactive) as Pharmacist (Pharmacist)  Indicate any recent Medical Services you may have received from other than [facility] providers in the past year (date may be approximate).     Assessment:   This is a routine wellness examination for Teresa Murphy.  Hearing/Vision screen Hearing Screening - Comments:: Denies hearing difficulties   Vision Screening - Comments:: Wears rx glasses - up to date with routine eye exams with  Dr Honor Loh   Goals Addressed               This Visit's Progress     Increase physical activity (pt-stated)        Stay Active       Depression Screen    02/21/2023   10:10 AM 01/14/2023    7:32 AM 02/12/2022    3:30 PM 01/09/2022   10:24 AM 06/25/2021   11:17 AM 12/25/2020   10:40 AM 12/21/2019   10:52 AM  PHQ 2/9 Scores  PHQ - 2 Score 0 0 0 0 6 0 0  PHQ- 9 Score     23      Fall Risk    02/21/2023   10:11 AM 02/20/2023    3:09 PM 01/14/2023  7:32 AM 02/12/2022    3:37 PM 02/08/2022    5:49 PM  Fall Risk   Falls in the past year? 0 0 0 0 0  Number falls in past yr: 0 0 0 0 0  Injury with Fall? 0 0 0 0 0  Risk for fall due to : No Fall Risks  Other  (Comment) No Fall Risks   Follow up Falls prevention discussed  Falls evaluation completed Falls prevention discussed     MEDICARE RISK AT HOME: Medicare Risk at Home Any stairs in or around the home?: No If so, are there any without handrails?: Yes Home free of loose throw rugs in walkways, pet beds, electrical cords, etc?: Yes Adequate lighting in your home to reduce risk of falls?: Yes Life alert?: No Use of a cane, walker or w/c?: No Grab bars in the bathroom?: No Shower chair or bench in shower?: No Elevated toilet seat or a handicapped toilet?: No  TIMED UP AND GO:  Was the test performed?  No    Cognitive Function:        02/21/2023   10:16 AM 02/12/2022    3:38 PM  6CIT Screen  What Year? 0 points 0 points  What month? 0 points 0 points  What time? 0 points 0 points  Count back from 20 0 points 0 points  Months in reverse 0 points 0 points  Repeat phrase 0 points 0 points  Total Score 0 points 0 points    Immunizations Immunization History  Administered Date(s) Administered   Fluad Quad(high Dose 65+) 12/21/2019, 12/25/2020, 12/03/2021   Influenza Split 11/07/2010, 11/05/2011, 11/04/2012   Influenza, High Dose Seasonal PF 01/11/2016, 12/05/2016, 02/07/2018, 12/21/2018, 11/22/2022   Influenza,inj,quad, With Preservative 11/04/2016   Influenza-Unspecified 12/05/2016, 12/03/2021   Moderna Covid-19 Fall Seasonal Vaccine 75yrs & older 11/22/2022   PFIZER(Purple Top)SARS-COV-2 Vaccination 03/29/2019, 04/19/2019, 11/20/2019   Pfizer(Comirnaty)Fall Seasonal Vaccine 12 years and older 12/03/2021   Pneumococcal Conjugate-13 02/04/2013   Pneumococcal Polysaccharide-23 04/30/2012, 06/25/2021   Tdap 09/07/2022   Zoster Recombinant(Shingrix) 09/07/2022, 11/22/2022    TDAP status: Up to date  Flu Vaccine status: Up to date  Pneumococcal vaccine status: Up to date  Covid-19 vaccine status: Completed vaccines  Qualifies for Shingles Vaccine? Yes   Zostavax completed  Yes   Shingrix Completed?: Yes  Screening Tests Health Maintenance  Topic Date Due   Medicare Annual Wellness (AWV)  02/21/2024   MAMMOGRAM  01/14/2025   Colonoscopy  11/04/2028   DTaP/Tdap/Td (2 - Td or Tdap) 09/06/2032   Pneumonia Vaccine 64+ Years old  Completed   INFLUENZA VACCINE  Completed   DEXA SCAN  Completed   COVID-19 Vaccine  Completed   Hepatitis C Screening  Completed   Zoster Vaccines- Shingrix  Completed   HPV VACCINES  Aged Out    Health Maintenance  There are no preventive care reminders to display for this patient.   Colorectal cancer screening: Type of screening: Colonoscopy. Completed 11/05/18. Repeat every 10 years  Mammogram status: Completed 01/15/23. Repeat every year  Bone Density status: Completed 04/30/22. Results reflect: Bone density results: OSTEOPENIA. Repeat every   years.    Additional Screening:  Hepatitis C Screening: does qualify; Completed 04/25/21  Vision Screening: Recommended annual ophthalmology exams for early detection of glaucoma and other disorders of the eye. Is the patient up to date with their annual eye exam?  Yes  Who is the provider or what is the name of the office in which the  patient attends annual eye exams? Dr Honor Loh If pt is not established with a provider, would they like to be referred to a provider to establish care? No .   Dental Screening: Recommended annual dental exams for proper oral hygiene    Community Resource Referral / Chronic Care Management:  CRR required this visit?  No   CCM required this visit?  No     Plan:     I have personally reviewed and noted the following in the patient's chart:   Medical and social history Use of alcohol, tobacco or illicit drugs  Current medications and supplements including opioid prescriptions. Patient is not currently taking opioid prescriptions. Functional ability and status Nutritional status Physical activity Advanced directives List of other  physicians Hospitalizations, surgeries, and ER visits in previous 12 months Vitals Screenings to include cognitive, depression, and falls Referrals and appointments  In addition, I have reviewed and discussed with patient certain preventive protocols, quality metrics, and best practice recommendations. A written personalized care plan for preventive services as well as general preventive health recommendations were provided to patient.     Tillie Rung, LPN   [id number]   After Visit Summary: (MyChart) Due to this being a telephonic visit, the after visit summary with patients personalized plan was offered to patient via MyChart   Nurse Notes: None

## 2023-02-21 NOTE — Patient Instructions (Addendum)
Teresa Murphy , Thank you for taking time to come for your Medicare Wellness Visit. I appreciate your ongoing commitment to your health goals. Please review the following plan we discussed and let me know if I can assist you in the future.   Referrals/Orders/Follow-Ups/Clinician Recommendations:   This is a list of the screening recommended for you and due dates:  Health Maintenance  Topic Date Due   Medicare Annual Wellness Visit  02/21/2024   Mammogram  01/14/2025   Colon Cancer Screening  11/04/2028   DTaP/Tdap/Td vaccine (2 - Td or Tdap) 09/06/2032   Pneumonia Vaccine  Completed   Flu Shot  Completed   DEXA scan (bone density measurement)  Completed   COVID-19 Vaccine  Completed   Hepatitis C Screening  Completed   Zoster (Shingles) Vaccine  Completed   HPV Vaccine  Aged Out    Advanced directives: (Copy Requested) Please bring a copy of your health care power of attorney and living will to the office to be added to your chart at your convenience.  Next Medicare Annual Wellness Visit scheduled for next year: Yes

## 2023-02-25 ENCOUNTER — Other Ambulatory Visit: Payer: Self-pay | Admitting: Family Medicine

## 2023-02-25 DIAGNOSIS — I10 Essential (primary) hypertension: Secondary | ICD-10-CM

## 2023-04-06 ENCOUNTER — Other Ambulatory Visit: Payer: Self-pay | Admitting: Family Medicine

## 2023-04-06 DIAGNOSIS — F411 Generalized anxiety disorder: Secondary | ICD-10-CM

## 2023-04-06 DIAGNOSIS — M170 Bilateral primary osteoarthritis of knee: Secondary | ICD-10-CM

## 2023-05-09 ENCOUNTER — Other Ambulatory Visit: Payer: Self-pay | Admitting: Family Medicine

## 2023-06-07 ENCOUNTER — Other Ambulatory Visit: Payer: Self-pay | Admitting: Internal Medicine

## 2023-07-07 ENCOUNTER — Other Ambulatory Visit: Payer: Self-pay | Admitting: Internal Medicine

## 2023-09-05 ENCOUNTER — Other Ambulatory Visit: Payer: Self-pay

## 2023-09-05 MED ORDER — MONTELUKAST SODIUM 10 MG PO TABS
10.0000 mg | ORAL_TABLET | Freq: Every day | ORAL | 4 refills | Status: DC
Start: 2023-09-05 — End: 2023-09-10

## 2023-09-05 NOTE — Telephone Encounter (Signed)
 [facility] faxed refill request for Montelukast  10 mg.  Per chart: Instructions   Return in about 1 year (around 01/09/2024). You are doing well!- We can continue current meds.      OK refill x 4

## 2023-09-10 MED ORDER — MONTELUKAST SODIUM 10 MG PO TABS: 10.0000 mg | ORAL_TABLET | Freq: Every day | ORAL | 2 refills | Status: AC

## 2023-09-10 NOTE — Telephone Encounter (Addendum)
 Change refill to 90 days supply.  Per chart note 01/09/2023: Assessment and Plan Currently managed with Singulair , Advair 250, and ProAir  HFA. -Continue current medications.

## 2023-09-10 NOTE — Addendum Note (Signed)
 Addended by: Amandajo Gonder M on: 09/10/2023 04:21 PM   Modules accepted: Orders

## 2023-10-24 ENCOUNTER — Other Ambulatory Visit: Payer: Self-pay | Admitting: Family Medicine

## 2023-10-24 DIAGNOSIS — E559 Vitamin D deficiency, unspecified: Secondary | ICD-10-CM

## 2023-11-27 ENCOUNTER — Other Ambulatory Visit: Payer: Self-pay | Admitting: Family Medicine

## 2023-11-27 DIAGNOSIS — E559 Vitamin D deficiency, unspecified: Secondary | ICD-10-CM

## 2024-01-15 ENCOUNTER — Other Ambulatory Visit: Payer: Self-pay | Admitting: Family Medicine

## 2024-02-02 DIAGNOSIS — E559 Vitamin D deficiency, unspecified: Secondary | ICD-10-CM

## 2024-02-23 ENCOUNTER — Other Ambulatory Visit: Payer: Self-pay | Admitting: Family Medicine

## 2024-02-23 DIAGNOSIS — I10 Essential (primary) hypertension: Secondary | ICD-10-CM

## 2024-02-27 ENCOUNTER — Telehealth: Payer: Self-pay

## 2024-02-27 NOTE — Telephone Encounter (Signed)
 Unsuccessful attempts to reach patient on preferred number listed in notes or scheduled AWV. Unable to leave voice message.

## 2024-03-12 ENCOUNTER — Other Ambulatory Visit: Payer: Self-pay | Admitting: Family Medicine

## 2024-03-12 DIAGNOSIS — F411 Generalized anxiety disorder: Secondary | ICD-10-CM

## 2024-03-12 DIAGNOSIS — M170 Bilateral primary osteoarthritis of knee: Secondary | ICD-10-CM
